# Patient Record
Sex: Female | Born: 1965 | Race: Black or African American | Hispanic: No | Marital: Married | Smoking: Never smoker
Health system: Southern US, Community
[De-identification: ages and names within clinical notes are randomized; demographics above are authoritative.]

## PROBLEM LIST (undated history)

## (undated) DIAGNOSIS — T7840XA Allergy, unspecified, initial encounter: Secondary | ICD-10-CM

## (undated) DIAGNOSIS — D649 Anemia, unspecified: Secondary | ICD-10-CM

## (undated) DIAGNOSIS — I10 Essential (primary) hypertension: Secondary | ICD-10-CM

## (undated) DIAGNOSIS — E785 Hyperlipidemia, unspecified: Secondary | ICD-10-CM

## (undated) DIAGNOSIS — E119 Type 2 diabetes mellitus without complications: Secondary | ICD-10-CM

## (undated) HISTORY — DX: Type 2 diabetes mellitus without complications: E11.9

## (undated) HISTORY — DX: Anemia, unspecified: D64.9

## (undated) HISTORY — DX: Essential (primary) hypertension: I10

## (undated) HISTORY — DX: Allergy, unspecified, initial encounter: T78.40XA

## (undated) HISTORY — DX: Hyperlipidemia, unspecified: E78.5

## (undated) HISTORY — PX: TUBAL LIGATION: SHX77

---

## 1999-10-10 ENCOUNTER — Other Ambulatory Visit: Admission: RE | Admit: 1999-10-10 | Discharge: 1999-10-10 | Payer: Self-pay | Admitting: Gynecology

## 2000-02-13 ENCOUNTER — Encounter: Payer: Self-pay | Admitting: Emergency Medicine

## 2000-02-13 ENCOUNTER — Emergency Department (HOSPITAL_COMMUNITY): Admission: EM | Admit: 2000-02-13 | Discharge: 2000-02-13 | Payer: Self-pay | Admitting: Emergency Medicine

## 2001-10-06 ENCOUNTER — Other Ambulatory Visit: Admission: RE | Admit: 2001-10-06 | Discharge: 2001-10-06 | Payer: Self-pay | Admitting: Gynecology

## 2003-11-26 ENCOUNTER — Other Ambulatory Visit: Admission: RE | Admit: 2003-11-26 | Discharge: 2003-11-26 | Payer: Self-pay | Admitting: Gynecology

## 2004-09-15 ENCOUNTER — Encounter (INDEPENDENT_AMBULATORY_CARE_PROVIDER_SITE_OTHER): Payer: Self-pay | Admitting: Internal Medicine

## 2004-09-15 LAB — CONVERTED CEMR LAB

## 2005-04-13 ENCOUNTER — Ambulatory Visit: Payer: Self-pay | Admitting: Family Medicine

## 2005-10-14 ENCOUNTER — Ambulatory Visit: Payer: Self-pay | Admitting: Family Medicine

## 2005-10-29 ENCOUNTER — Ambulatory Visit: Payer: Self-pay | Admitting: Family Medicine

## 2005-11-20 ENCOUNTER — Other Ambulatory Visit: Admission: RE | Admit: 2005-11-20 | Discharge: 2005-11-20 | Payer: Self-pay | Admitting: Gynecology

## 2006-01-23 ENCOUNTER — Ambulatory Visit: Payer: Self-pay | Admitting: Otolaryngology

## 2008-01-20 ENCOUNTER — Other Ambulatory Visit: Admission: RE | Admit: 2008-01-20 | Discharge: 2008-01-20 | Payer: Self-pay | Admitting: Gynecology

## 2008-01-20 ENCOUNTER — Encounter: Payer: Self-pay | Admitting: Gynecology

## 2008-01-20 ENCOUNTER — Ambulatory Visit: Payer: Self-pay | Admitting: Gynecology

## 2008-02-03 ENCOUNTER — Ambulatory Visit: Payer: Self-pay | Admitting: Gynecology

## 2008-06-28 ENCOUNTER — Ambulatory Visit: Payer: Self-pay | Admitting: Family Medicine

## 2008-06-28 DIAGNOSIS — J069 Acute upper respiratory infection, unspecified: Secondary | ICD-10-CM | POA: Insufficient documentation

## 2008-06-28 DIAGNOSIS — J309 Allergic rhinitis, unspecified: Secondary | ICD-10-CM | POA: Insufficient documentation

## 2008-07-26 ENCOUNTER — Encounter (INDEPENDENT_AMBULATORY_CARE_PROVIDER_SITE_OTHER): Payer: Self-pay | Admitting: Internal Medicine

## 2008-07-26 DIAGNOSIS — H9190 Unspecified hearing loss, unspecified ear: Secondary | ICD-10-CM | POA: Insufficient documentation

## 2009-01-30 ENCOUNTER — Other Ambulatory Visit: Admission: RE | Admit: 2009-01-30 | Discharge: 2009-01-30 | Payer: Self-pay | Admitting: Gynecology

## 2009-01-30 ENCOUNTER — Ambulatory Visit: Payer: Self-pay | Admitting: Gynecology

## 2009-01-30 ENCOUNTER — Encounter: Payer: Self-pay | Admitting: Gynecology

## 2009-02-18 ENCOUNTER — Ambulatory Visit: Payer: Self-pay | Admitting: Gynecology

## 2010-09-18 ENCOUNTER — Inpatient Hospital Stay (INDEPENDENT_AMBULATORY_CARE_PROVIDER_SITE_OTHER)
Admission: RE | Admit: 2010-09-18 | Discharge: 2010-09-18 | Disposition: A | Discharge status: Home / Self Care | Payer: 59 | Source: Ambulatory Visit | Attending: Family Medicine | Admitting: Family Medicine

## 2010-09-18 DIAGNOSIS — R6889 Other general symptoms and signs: Secondary | ICD-10-CM

## 2010-09-18 DIAGNOSIS — R509 Fever, unspecified: Secondary | ICD-10-CM

## 2010-11-04 ENCOUNTER — Inpatient Hospital Stay (INDEPENDENT_AMBULATORY_CARE_PROVIDER_SITE_OTHER)
Admission: RE | Admit: 2010-11-04 | Discharge: 2010-11-04 | Disposition: A | Discharge status: Home / Self Care | Payer: 59 | Source: Ambulatory Visit | Attending: Emergency Medicine | Admitting: Emergency Medicine

## 2010-11-04 DIAGNOSIS — M549 Dorsalgia, unspecified: Secondary | ICD-10-CM

## 2010-11-04 DIAGNOSIS — M79609 Pain in unspecified limb: Secondary | ICD-10-CM

## 2011-06-04 ENCOUNTER — Ambulatory Visit: Payer: 59 | Admitting: Family Medicine

## 2012-03-21 ENCOUNTER — Encounter: Payer: Self-pay | Admitting: Gynecology

## 2012-03-30 ENCOUNTER — Encounter: Payer: Self-pay | Admitting: Gynecology

## 2012-03-30 ENCOUNTER — Other Ambulatory Visit (HOSPITAL_COMMUNITY)
Admission: RE | Admit: 2012-03-30 | Discharge: 2012-03-30 | Disposition: A | Discharge status: Home / Self Care | Payer: 59 | Source: Ambulatory Visit | Attending: Gynecology | Admitting: Gynecology

## 2012-03-30 ENCOUNTER — Ambulatory Visit (INDEPENDENT_AMBULATORY_CARE_PROVIDER_SITE_OTHER): Payer: 59 | Admitting: Gynecology

## 2012-03-30 VITALS — BP 140/92 | Ht 63.0 in | Wt 255.0 lb

## 2012-03-30 DIAGNOSIS — N951 Menopausal and female climacteric states: Secondary | ICD-10-CM

## 2012-03-30 DIAGNOSIS — Z1151 Encounter for screening for human papillomavirus (HPV): Secondary | ICD-10-CM | POA: Insufficient documentation

## 2012-03-30 DIAGNOSIS — Z01419 Encounter for gynecological examination (general) (routine) without abnormal findings: Secondary | ICD-10-CM | POA: Insufficient documentation

## 2012-03-30 DIAGNOSIS — N949 Unspecified condition associated with female genital organs and menstrual cycle: Secondary | ICD-10-CM

## 2012-03-30 DIAGNOSIS — N925 Other specified irregular menstruation: Secondary | ICD-10-CM

## 2012-03-30 DIAGNOSIS — N938 Other specified abnormal uterine and vaginal bleeding: Secondary | ICD-10-CM

## 2012-03-30 LAB — CBC WITH DIFFERENTIAL/PLATELET
Basophils Absolute: 0 K/uL (ref 0.0–0.1)
Basophils Relative: 0 % (ref 0–1)
Eosinophils Absolute: 0.2 K/uL (ref 0.0–0.7)
Eosinophils Relative: 2 % (ref 0–5)
HCT: 39.3 % (ref 36.0–46.0)
Hemoglobin: 13.6 g/dL (ref 12.0–15.0)
Lymphocytes Relative: 40 % (ref 12–46)
Lymphs Abs: 2.8 K/uL (ref 0.7–4.0)
MCH: 29.9 pg (ref 26.0–34.0)
MCHC: 34.6 g/dL (ref 30.0–36.0)
MCV: 86.4 fL (ref 78.0–100.0)
Monocytes Absolute: 0.6 K/uL (ref 0.1–1.0)
Monocytes Relative: 8 % (ref 3–12)
Neutro Abs: 3.5 K/uL (ref 1.7–7.7)
Neutrophils Relative %: 50 % (ref 43–77)
Platelets: 293 K/uL (ref 150–400)
RBC: 4.55 MIL/uL (ref 3.87–5.11)
RDW: 13.7 % (ref 11.5–15.5)
WBC: 7 K/uL (ref 4.0–10.5)

## 2012-03-30 NOTE — Progress Notes (Signed)
Krista Newton [facility] July 13, 1965 [id number]        46 y.o.  [id number] for annual exam.  Has not been in the office in over 3 years. Several issues noted below.  Past medical history,surgical history, medications, allergies, family history and social history were all reviewed and documented in the EPIC chart. ROS:  Was performed and pertinent positives and negatives are included in the history.  Exam: Krista Newton Vitals:   03/30/12 1555  BP: 140/92  Height: 5\' 3"  (1.6 m)  Weight: 255 lb (115.667 kg)   General appearance  Normal Skin grossly normal Head/Neck normal with no cervical or supraclavicular adenopathy thyroid normal Lungs  clear Cardiac RR, without RMG Abdominal  soft, nontender, without masses, organomegaly or hernia Breasts  examined lying and sitting without masses, retractions, discharge or axillary adenopathy. Pelvic  Ext/BUS/vagina  normal   Cervix  normal Pap/HPV  Uterus  Anteverted to axial, normal size, shape and contour, midline and mobile nontender   Adnexa  Without masses or tenderness    Anus and perineum  normal   Rectovaginal  normal sphincter tone without palpated masses or tenderness.    Assessment/Plan:  46 y.o. [id number] female for annual exam, status post BTL.   1. Menopausal symptoms/DUB. Patient over the last 2 years has been having spotting on and off going several months and then spotting with the onset of hot flashes and night sweats. Has not had a regular period in several years. She was evaluated the last time we saw her actually for heavy periods and underwent a sonohysterogram which was normal and a benign proliferative endometrial biopsy. Also complaining of fatigue. Will check FSH/TSH and sonohysterogram. Options for treatment include observation, OTC soy based up to and including HRT reviewed. The risks benefits of HRT to include the WHI study, increased risk of stroke heart attack DVT and breast cancer reviewed. Patient's not interested at  this point and would prefer to try OTC soy. She'll follow up for her lab work and ultrasound and we'll go from there.  2. Pap smear. Pap/HPV done today. Last Pap smear 2010. No history of abnormal Pap smears. Multiple normal reports in her chart proceeding the 2010 Pap smear. If this Pap smear is normal plan every five-year screening. 3. Mammography. Patient's overdo and noticed to schedule I gave her names of facilities in [place] to call and schedule. SBE mildly reviewed. 4. Fatigue/weight gain. The need to exercise and diet reviewed. 5. Hypertension. The patient's blood pressure is 140/92. She does check it at home historically and said that usually is not this high but usually runs between 120-130/70-80.  I asked her to have this rechecked in a nonexam situation. She'll also follow up with her primary at the scheduled appointment to have this rechecked. 6. Health maintenance. Patient has an appointment to see her primary physician and no additional lab work was done. The need to have her cholesterol glucose and any other lab her primary feels needed done discussed.Dara Lords MD, 4:24 PM 03/30/2012

## 2012-03-30 NOTE — Patient Instructions (Addendum)
Follow up for ultrasound as scheduled.  Call to Schedule your mammogram  Facilities in [place]: 1)  [facility], [address] Phone: [phone] 2)  [facility], [address] Phone: [phone] 3)  Dr. Bertrand at [facility]  [address] Phone: [phone]     Mammogram A mammogram is an X-ray test to find changes in a woman's breast. You should get a mammogram if:  You are 48 years of age or older  You have risk factors.   Your doctor recommends that you have one.  BEFORE THE TEST  Do not schedule the test the week before your period, especially if your breasts are sore during this time.  On the day of your mammogram:  Wash your breasts and armpits well. After washing, do not put on any deodorant or talcum powder on until after your test.   Eat and drink as you usually do.   Take your medicines as usual.   If you are diabetic and take insulin, make sure you:   Eat before coming for your test.   Take your insulin as usual.   If you cannot keep your appointment, call before the appointment to cancel. Schedule another appointment.  TEST  You will need to undress from the waist up. You will put on a hospital gown.   Your breast will be put on the mammogram machine, and it will press firmly on your breast with a piece of plastic called a compression paddle. This will make your breast flatter so that the machine can X-ray all parts of your breast.   Both breasts will be X-rayed. Each breast will be X-rayed from above and from the side. An X-ray might need to be taken again if the picture is not good enough.   The mammogram will last about 15 to 30 minutes.  AFTER THE TEST Finding out the results of your test Ask when your test results will be ready. Make sure you get your test results.  Document Released: 07/31/2008 Document Revised: 04/23/2011 Document  Reviewed: 07/31/2008 ExitCare Patient Information 2012 [employer].   

## 2012-03-31 ENCOUNTER — Encounter: Payer: Self-pay | Admitting: Gynecology

## 2012-03-31 LAB — URINALYSIS W MICROSCOPIC + REFLEX CULTURE
Bacteria, UA: NONE SEEN
Bilirubin Urine: NEGATIVE
Casts: NONE SEEN
Crystals: NONE SEEN
Glucose, UA: NEGATIVE mg/dL
Hgb urine dipstick: NEGATIVE
Ketones, ur: NEGATIVE mg/dL
Leukocytes, UA: NEGATIVE
Nitrite: NEGATIVE
Protein, ur: NEGATIVE mg/dL
Specific Gravity, Urine: 1.011 (ref 1.005–1.030)
Squamous Epithelial / HPF: NONE SEEN
Urobilinogen, UA: 0.2 mg/dL (ref 0.0–1.0)
pH: 7 (ref 5.0–8.0)

## 2012-03-31 LAB — FOLLICLE STIMULATING HORMONE: FSH: 40 m[IU]/mL

## 2012-03-31 LAB — TSH: TSH: 2.214 u[IU]/mL (ref 0.350–4.500)

## 2012-05-13 ENCOUNTER — Other Ambulatory Visit: Payer: Self-pay | Admitting: Gynecology

## 2012-05-13 ENCOUNTER — Ambulatory Visit (INDEPENDENT_AMBULATORY_CARE_PROVIDER_SITE_OTHER): Payer: 59

## 2012-05-13 ENCOUNTER — Ambulatory Visit (INDEPENDENT_AMBULATORY_CARE_PROVIDER_SITE_OTHER): Payer: 59 | Admitting: Gynecology

## 2012-05-13 ENCOUNTER — Encounter: Payer: Self-pay | Admitting: Gynecology

## 2012-05-13 DIAGNOSIS — N924 Excessive bleeding in the premenopausal period: Secondary | ICD-10-CM

## 2012-05-13 DIAGNOSIS — N951 Menopausal and female climacteric states: Secondary | ICD-10-CM

## 2012-05-13 DIAGNOSIS — N938 Other specified abnormal uterine and vaginal bleeding: Secondary | ICD-10-CM

## 2012-05-13 DIAGNOSIS — N882 Stricture and stenosis of cervix uteri: Secondary | ICD-10-CM

## 2012-05-13 DIAGNOSIS — N925 Other specified irregular menstruation: Secondary | ICD-10-CM

## 2012-05-13 DIAGNOSIS — N949 Unspecified condition associated with female genital organs and menstrual cycle: Secondary | ICD-10-CM

## 2012-05-13 MED ORDER — LIDOCAINE HCL (PF) 1 % IJ SOLN
12.0000 mL | Freq: Once | INTRAMUSCULAR | Status: AC
  Administered 2012-05-13: 12 mL

## 2012-05-13 NOTE — Progress Notes (Signed)
Patient presents for sonohysterogram.  History of perimenopausal symptoms coming and going over the last several years with hot flashes/night sweats. Also spotting on and off that comes and goes over the last year or two. No regular menses.  Ultrasound shows normal uterine size and echotexture. Endometrial echo 3.8 mm. Right and left ovaries normal. Cul-de-sac negative. Cervix was stenotic. Paracervical block 1% lidocaine 10 cc total placed.  Single-tooth tenaculum anterior lip stabilization. Cervical os was dilated and subsequently the sonohysterogram catheter was placed without difficulty, good distention with no abnormalities. Endometrial sample taken. Patient tolerated well.  Assessment and plan: Patient's lab work shows [facility] of [facility]. TSH normal. Perimenopausal with oscillating hot flashes and sweats as well as some spotting on and off. Patient will follow up for biopsy results. Assuming negative. Options for management were reviewed to include observation, soy based OTC products, prescription nonhormonal such as Effexor or and HRT. I discussed the risks benefits of HRT and various routes to include oral transdermal transvaginal. WHI study with increased risk of stroke heart attack DVT and breast cancer reviewed. Patient would prefer observation with OTC soy-based product trial. Assuming biopsy negative she will try this follow up if she wants to rediscuss HRT or alternatives.

## 2012-05-13 NOTE — Patient Instructions (Addendum)
Office will call you with biopsy results Try over the counter Soy based products as we discussed Call if you want to rediscuss hormone treatment or if you have prolonged/atypical bleeding

## 2012-06-14 ENCOUNTER — Telehealth: Payer: Self-pay | Admitting: Family Medicine

## 2012-06-14 NOTE — Telephone Encounter (Signed)
appt scheduled

## 2012-06-14 NOTE — Telephone Encounter (Signed)
That is fine - go ahead and schedule, thanks

## 2012-06-14 NOTE — Telephone Encounter (Signed)
Patient called wanting to make an appt with you for a phy.  She was informed that her last date of service in this office was 06/28/08, so she would be considered a new patient.  She said when her husband Krista Newton - 05/14/67 ) came in on his last visit he discussed his wife coming back as a new patient with you and you agreed.  Please advise as to your wishes on this.

## 2012-07-04 ENCOUNTER — Ambulatory Visit (INDEPENDENT_AMBULATORY_CARE_PROVIDER_SITE_OTHER): Payer: 59 | Admitting: Family Medicine

## 2012-07-04 ENCOUNTER — Encounter: Payer: Self-pay | Admitting: Family Medicine

## 2012-07-04 VITALS — BP 136/94 | HR 90 | Temp 98.3°F | Ht 64.0 in | Wt 252.2 lb

## 2012-07-04 DIAGNOSIS — Z0000 Encounter for general adult medical examination without abnormal findings: Secondary | ICD-10-CM | POA: Insufficient documentation

## 2012-07-04 LAB — CBC WITH DIFFERENTIAL/PLATELET
Basophils Absolute: 0 10*3/uL (ref 0.0–0.1)
Basophils Relative: 0.6 % (ref 0.0–3.0)
Hemoglobin: 13.4 g/dL (ref 12.0–15.0)
Lymphs Abs: 2.2 10*3/uL (ref 0.7–4.0)
MCHC: 33.9 g/dL (ref 30.0–36.0)
MCV: 89.1 fl (ref 78.0–100.0)
Neutro Abs: 3.6 10*3/uL (ref 1.4–7.7)
RBC: 4.42 Mil/uL (ref 3.87–5.11)

## 2012-07-04 LAB — COMPREHENSIVE METABOLIC PANEL
ALT: 18 U/L (ref 0–35)
Albumin: 4 g/dL (ref 3.5–5.2)
CO2: 27 mEq/L (ref 19–32)
Chloride: 105 mEq/L (ref 96–112)
GFR: 122.71 mL/min (ref >60.00)
Glucose, Bld: 106 mg/dL — ABNORMAL HIGH (ref 70–99)
Potassium: 4.1 mEq/L (ref 3.5–5.1)
Sodium: 139 mEq/L (ref 135–145)
Total Bilirubin: 0.8 mg/dL (ref 0.3–1.2)
Total Protein: 7.8 g/dL (ref 6.0–8.3)

## 2012-07-04 LAB — LIPID PANEL
HDL: 54.3 mg/dL (ref >39.00)
Total CHOL/HDL Ratio: 4

## 2012-07-04 LAB — TSH: TSH: 1.68 u[IU]/mL (ref 0.35–5.50)

## 2012-07-04 NOTE — Progress Notes (Signed)
Subjective:    Patient ID: Krista Newton, female    DOB: 1965/07/15, 47 y.o.   MRN: [id number]  HPI Here for health maintenance exam and to review chronic medical problems    Doing ok overall but stays tired   Wt is down 3 lb  Obese  Not exercising   Flu vaccine - got that in the fall at her job   Pap 11/13 Sees Dr Audie Box Had visit to discuss menopausal symptoms -LMP was 4-5 years ago  Had hystosalpingogram for bleeding issues  Had thyroid test then  Fsh/ LH were up  Disc options for hot flashes  She opted not to treat at this time  She needs annual mammogram - is about to schedule that at the breast center  No lumps or changes on self exam  Needs labs today  Patient Active Problem List  Diagnosis  . HEARING LOSS, RIGHT EAR  . ALLERGIC RHINITIS  . Routine general medical examination at a health care facility   No past medical history on file. Past Surgical History  Procedure Laterality Date  . Cesarean section    . Tubal ligation     History  Substance Use Topics  . Smoking status: Never Smoker   . Smokeless tobacco: Never Used  . Alcohol Use: No   Family History  Problem Relation Age of Onset  . Hypertension Mother   . Diabetes Mother   . Hypertension Father    No Known Allergies Current Outpatient Prescriptions on File Prior to Visit  Medication Sig Dispense Refill  . Acetaminophen (TYLENOL PO) Take by mouth.       No current facility-administered medications on file prior to visit.        Review of Systems Review of Systems  Constitutional: Negative for fever, appetite change,  and unexpected weight change. pos for fatigue Eyes: Negative for pain and visual disturbance.  Respiratory: Negative for cough and shortness of breath.   Cardiovascular: Negative for cp or palpitations    Gastrointestinal: Negative for nausea, diarrhea and constipation.  Genitourinary: Negative for urgency and frequency. pos for menopausal hot flashes and  difficulty sleeping  Skin: Negative for pallor or rash   Neurological: Negative for weakness, light-headedness, numbness and headaches.  Hematological: Negative for adenopathy. Does not bruise/bleed easily.  Psychiatric/Behavioral: Negative for dysphoric mood. The patient is not nervous/anxious.         Objective:   Physical Exam  Constitutional: She appears well-developed and well-nourished. No distress.  obese and well appearing   HENT:  Head: Normocephalic and atraumatic.  Right Ear: External ear normal.  Left Ear: External ear normal.  Nose: Nose normal.  Mouth/Throat: Oropharynx is clear and moist.  Eyes: Conjunctivae and EOM are normal. Pupils are equal, round, and reactive to light. Right eye exhibits no discharge. Left eye exhibits no discharge. No scleral icterus.  Neck: Normal range of motion. Neck supple. No JVD present. Carotid bruit is not present. No thyromegaly present.  Cardiovascular: Normal rate, regular rhythm, normal heart sounds and intact distal pulses.  Exam reveals no gallop.   Pulmonary/Chest: Breath sounds normal. No respiratory distress. She has no wheezes.  Abdominal: Soft. Bowel sounds are normal. She exhibits no distension, no abdominal bruit and no mass. There is no tenderness.  Musculoskeletal: She exhibits no edema and no tenderness.  Lymphadenopathy:    She has no cervical adenopathy.  Neurological: She is alert. She has normal reflexes. No cranial nerve deficit. She exhibits normal muscle tone. Coordination  normal.  Skin: Skin is warm and dry. No rash noted. No erythema. No pallor.  Psychiatric: She has a normal mood and affect.          Assessment & Plan:

## 2012-07-04 NOTE — Patient Instructions (Addendum)
Work on [employer] with regular meals - healthy choices  Aim for 5 days of exercise a week (30 min for each session)  Think about weight watchers or another plan  Labs today

## 2012-07-04 NOTE — Assessment & Plan Note (Signed)
Reviewed health habits including diet and exercise and skin cancer prevention Also reviewed health mt list, fam hx and immunizations  Wellness labs drawn today Pt is experiencing fatigue and hot flashes from menopause- following that with gyn Highly recommended wt loss and a regular exercise program

## 2012-07-05 ENCOUNTER — Encounter: Payer: Self-pay | Admitting: *Deleted

## 2012-08-15 ENCOUNTER — Ambulatory Visit (INDEPENDENT_AMBULATORY_CARE_PROVIDER_SITE_OTHER): Payer: 59 | Admitting: Family Medicine

## 2012-08-15 ENCOUNTER — Encounter: Payer: Self-pay | Admitting: Family Medicine

## 2012-08-15 VITALS — BP 138/86 | HR 78 | Temp 98.9°F | Ht 64.0 in | Wt 254.2 lb

## 2012-08-15 DIAGNOSIS — J019 Acute sinusitis, unspecified: Secondary | ICD-10-CM

## 2012-08-15 DIAGNOSIS — B9689 Other specified bacterial agents as the cause of diseases classified elsewhere: Secondary | ICD-10-CM

## 2012-08-15 MED ORDER — GUAIFENESIN-CODEINE 100-10 MG/5ML PO SYRP: 5.0000 mL | ORAL_SOLUTION | Freq: Four times a day (QID) | ORAL | Status: DC | PRN

## 2012-08-15 MED ORDER — AMOXICILLIN-POT CLAVULANATE 875-125 MG PO TABS: 1.0000 | ORAL_TABLET | Freq: Two times a day (BID) | ORAL | Status: DC

## 2012-08-15 NOTE — Progress Notes (Signed)
Subjective:    Patient ID: Krista Newton, female    DOB: 1965-07-01, 47 y.o.   MRN: [id number]  HPI Here with a bad cough and also a rash  This started last week  She has been working in a very dusty area  Sinus congestion with facial pain on the R  Took some tylenol cough and cold - not getting rid of it  By Friday- cold chills- thinks she had a fever Now coughing all night  Sometimes gets a little phlegm up (not a lot- yellow in color)  Also used some nasal saline spray  Fullness in her ears -worse on R   Hx of pneumonia a year ago  Last week had a rash - skin felt rough and chaffed - and she had some scaley brown spots on her face and arms  A moisturizer helped  Much better now   She does wear a mask at work  Patient Active Problem List  Diagnosis  . HEARING LOSS, RIGHT EAR  . ALLERGIC RHINITIS  . Routine general medical examination at a health care facility   No past medical history on file. Past Surgical History  Procedure Laterality Date  . Cesarean section    . Tubal ligation     History  Substance Use Topics  . Smoking status: Never Smoker   . Smokeless tobacco: Never Used  . Alcohol Use: No   Family History  Problem Relation Age of Onset  . Hypertension Mother   . Diabetes Mother   . Hypertension Father    No Known Allergies Current Outpatient Prescriptions on File Prior to Visit  Medication Sig Dispense Refill  . Acetaminophen (TYLENOL PO) Take by mouth.       No current facility-administered medications on file prior to visit.    Review of Systems Review of Systems  Constitutional: Negative for appetite change,  and unexpected weight change.  Eyes: Negative for pain and visual disturbance.  ENT pos for congestion/ sinus pain/ ear pain and st  Respiratory: Negative for wheeze  and shortness of breath.   Cardiovascular: Negative for cp or palpitations    Gastrointestinal: Negative for nausea, diarrhea and constipation.  Genitourinary:  Negative for urgency and frequency.  Skin: Negative for pallor or rash   Neurological: Negative for weakness, light-headedness, numbness and headaches.  Hematological: Negative for adenopathy. Does not bruise/bleed easily.  Psychiatric/Behavioral: Negative for dysphoric mood. The patient is not nervous/anxious.         Objective:   Physical Exam  Constitutional: She appears well-developed and well-nourished. No distress.  obese and well appearing   HENT:  Head: Normocephalic and atraumatic.  Right Ear: External ear normal.  Left Ear: External ear normal.  Mouth/Throat: Oropharynx is clear and moist. No oropharyngeal exudate.  Nares are injected and congested  bilat maxillary sinus tenderness worse on R   Eyes: Conjunctivae and EOM are normal. Pupils are equal, round, and reactive to light. Right eye exhibits no discharge. Left eye exhibits no discharge.  Neck: Normal range of motion. Neck supple.  Cardiovascular: Normal rate and regular rhythm.   Pulmonary/Chest: Effort normal. No respiratory distress. She has no wheezes. She has no rales. She exhibits no tenderness.  Lymphadenopathy:    She has no cervical adenopathy.  Skin: Skin is warm and dry. No rash noted.  Rash resolved Some dry skin on arms  Psychiatric: She has a normal mood and affect.          Assessment & Plan:

## 2012-08-15 NOTE — Patient Instructions (Addendum)
Drink lots of fluids and try to get some rest  Use nasal saline spray Take augmentin for sinus infection  Use cough medicine with caution (sedation) Update if not starting to improve in a week or if worsening

## 2012-08-15 NOTE — Assessment & Plan Note (Signed)
With ongoing cough and R sided facial pain / purulent drainage tx with augmentin and for cough- robitussin ac with caution Disc symptomatic care - see instructions on AVS  Update if not starting to improve in a week or if worsening

## 2012-08-17 ENCOUNTER — Telehealth: Payer: Self-pay | Admitting: Family Medicine

## 2012-08-17 NOTE — Telephone Encounter (Signed)
Letter mailed per pt request.

## 2012-08-17 NOTE — Telephone Encounter (Signed)
Patient Information:  Caller Name: Elzie  Phone: [phone]  Patient: Krista Newton, Krista Newton  Gender: Female  DOB: Jun 13, 1965  Age: 47 Years  PCP: Roxy Manns [facility])  Pregnant: No  Office Follow Up:  Does the office need to follow up with this patient?: Yes  Instructions For The Office: Patient seen on 08/15/12 by Dr. Milinda Antis.  She is requesting a note from work to cover 03/31-through 08/17/12.  Please follow up patient regarding this.  RN Note:  Nose bleed occurred this morning during bath-07:15-it bled about 10 mins today.  Has happened before with sinus trouble.  Patient has had no other episodes with this sinus infection.  Denies history of HTN or being on blood thinners.  Triaged with no emergent symptom assessed.  Care advice given with caller demonstrating her understanding.  Patient was seen in the office on 08/15/12 and given Augmentin and Robitussin AC.  Is requesting a note for work to cover being out 03/31 through 4/02.  Symptoms  Reason For Call & Symptoms: Nosebleed.  Was seen by office on 04/01-diagnosed with sinus infection.  Placed on Augmentin and a cough medicine.  Reviewed Health History In EMR: Yes  Reviewed Medications In EMR: Yes  Reviewed Allergies In EMR: Yes  Reviewed Surgeries / Procedures: Yes  Date of Onset of Symptoms: 08/17/2012 OB / GYN:  LMP: Unknown  Guideline(s) Used:  Nosebleed  Disposition Per Guideline:   Home Care  Reason For Disposition Reached:   Bleeding present < 30 minutes and using correct method of direct pressure  Advice Given:  N/A  Patient Will Follow Care Advice:  YES

## 2012-08-17 NOTE — Telephone Encounter (Signed)
Work note in IN box

## 2013-02-07 ENCOUNTER — Encounter (HOSPITAL_COMMUNITY): Payer: Self-pay | Admitting: Emergency Medicine

## 2013-02-07 ENCOUNTER — Emergency Department (HOSPITAL_COMMUNITY)
Admission: EM | Admit: 2013-02-07 | Discharge: 2013-02-07 | Disposition: A | Discharge status: Home / Self Care | Payer: 59 | Attending: Emergency Medicine | Admitting: Emergency Medicine

## 2013-02-07 ENCOUNTER — Emergency Department (HOSPITAL_COMMUNITY): Payer: 59

## 2013-02-07 DIAGNOSIS — M62830 Muscle spasm of back: Secondary | ICD-10-CM

## 2013-02-07 DIAGNOSIS — J069 Acute upper respiratory infection, unspecified: Secondary | ICD-10-CM

## 2013-02-07 DIAGNOSIS — R509 Fever, unspecified: Secondary | ICD-10-CM | POA: Insufficient documentation

## 2013-02-07 DIAGNOSIS — R300 Dysuria: Secondary | ICD-10-CM | POA: Insufficient documentation

## 2013-02-07 DIAGNOSIS — IMO0001 Reserved for inherently not codable concepts without codable children: Secondary | ICD-10-CM | POA: Insufficient documentation

## 2013-02-07 DIAGNOSIS — Z792 Long term (current) use of antibiotics: Secondary | ICD-10-CM | POA: Insufficient documentation

## 2013-02-07 DIAGNOSIS — M5380 Other specified dorsopathies, site unspecified: Secondary | ICD-10-CM | POA: Insufficient documentation

## 2013-02-07 LAB — URINALYSIS, ROUTINE W REFLEX MICROSCOPIC
Glucose, UA: NEGATIVE mg/dL
Ketones, ur: NEGATIVE mg/dL
Leukocytes, UA: NEGATIVE
Nitrite: NEGATIVE
pH: 7 (ref 5.0–8.0)

## 2013-02-07 MED ORDER — MORPHINE SULFATE 4 MG/ML IJ SOLN
4.0000 mg | Freq: Once | INTRAMUSCULAR | Status: AC
  Administered 2013-02-07: 4 mg via INTRAMUSCULAR
  Filled 2013-02-07: qty 1

## 2013-02-07 MED ORDER — CYCLOBENZAPRINE HCL 10 MG PO TABS: 10.0000 mg | ORAL_TABLET | Freq: Two times a day (BID) | ORAL | Status: DC | PRN

## 2013-02-07 MED ORDER — PREDNISONE 20 MG PO TABS: 40.0000 mg | ORAL_TABLET | Freq: Every day | ORAL | Status: DC

## 2013-02-07 MED ORDER — DEXAMETHASONE SODIUM PHOSPHATE 10 MG/ML IJ SOLN
10.0000 mg | Freq: Once | INTRAMUSCULAR | Status: AC
  Administered 2013-02-07: 10 mg via INTRAMUSCULAR
  Filled 2013-02-07: qty 1

## 2013-02-07 NOTE — ED Provider Notes (Signed)
CSN: [id number]     Arrival date & time 02/07/13  1728 History   First MD Initiated Contact with Patient 02/07/13 2107     Chief Complaint  Patient presents with  . Back Pain  . Cough  . Dysuria   (Consider location/radiation/quality/duration/timing/severity/associated sxs/prior Treatment) HPI Comments: Patient is a 47 year old female presented to the emergency department for multiple complaints. Patient's first complaint is right sided lower back pain without radiation that has been present for 2-3 days. Patient describes the pain as muscle tightness that is worsened with movement and ambulation. Patient tried one Motrin with minimal relief last evening. Patient states she is able to walk but it is painful. She states resting alleviates her pain. She rates her back pain 9/10. She denies any bladder or bowel incontinence, history of back surgery, history of IV drug use, history of cancer. Patient's second complaint is one day of nonproductive cough with subjective fever and chills and bodyaches. Patient states typically once a year she gets a pneumonia or bronchitis. She has not tried any over-the-counter medications or symptomatic care to help alleviate cough but has tried a Tylenol with some relief of myalgias. Denies headache, numbness or weakness, hemoptysis, chest pain, shortness of breath.  Patient is a 47 y.o. female presenting with back pain, cough, and dysuria.  Back Pain Associated symptoms: no abdominal pain, no chest pain, no dysuria, no fever and no headaches   Cough Associated symptoms: no chest pain, no fever, no headaches, no shortness of breath and no wheezing   Dysuria Associated symptoms: no abdominal pain, no fever, no flank pain, no nausea and no vomiting     History reviewed. No pertinent past medical history. Past Surgical History  Procedure Laterality Date  . Cesarean section    . Tubal ligation     Family History  Problem Relation Age of Onset  . Hypertension  Mother   . Diabetes Mother   . Hypertension Father    History  Substance Use Topics  . Smoking status: Never Smoker   . Smokeless tobacco: Never Used  . Alcohol Use: No   OB History   Grav Para Term Preterm Abortions TAB SAB Ect Mult Living   2 2 2       2      Review of Systems  Constitutional: Negative for fever.  HENT: Negative for neck pain and neck stiffness.   Eyes: Negative.   Respiratory: Positive for cough. Negative for shortness of breath and wheezing.   Cardiovascular: Negative for chest pain.  Gastrointestinal: Negative for nausea, vomiting and abdominal pain.  Genitourinary: Positive for urgency. Negative for dysuria, frequency and flank pain.  Musculoskeletal: Positive for back pain.  Skin: Negative.   Neurological: Negative for syncope and headaches.    Allergies  Review of patient's allergies indicates no known allergies.  Home Medications   Current Outpatient Rx  Name  Route  Sig  Dispense  Refill  . Acetaminophen (TYLENOL PO)   Oral   Take by mouth.         Marland Kitchen ibuprofen (ADVIL,MOTRIN) 200 MG tablet   Oral   Take 400 mg by mouth every 6 (six) hours as needed for pain.         . pseudoephedrine (SUDAFED) 30 MG tablet   Oral   Take 30 mg by mouth every 4 (four) hours as needed for congestion.         Marland Kitchen amoxicillin-clavulanate (AUGMENTIN) 875-125 MG per tablet   Oral  Take 1 tablet by mouth 2 (two) times daily.   14 tablet   0   . cyclobenzaprine (FLEXERIL) 10 MG tablet   Oral   Take 1 tablet (10 mg total) by mouth 2 (two) times daily as needed for muscle spasms.   20 tablet   0   . predniSONE (DELTASONE) 20 MG tablet   Oral   Take 2 tablets (40 mg total) by mouth daily.   10 tablet   0    BP 112/81  Pulse 89  Temp(Src) 98.8 F (37.1 C) (Rectal)  Resp 16  SpO2 100% Physical Exam  Constitutional: She is oriented to person, place, and time. She appears well-developed and well-nourished. No distress.  HENT:  Head:  Normocephalic and atraumatic.  Right Ear: External ear normal.  Left Ear: External ear normal.  Nose: Nose normal.  Mouth/Throat: Uvula is midline, oropharynx is clear and moist and mucous membranes are normal. No oropharyngeal exudate.  Eyes: Conjunctivae and EOM are normal. Pupils are equal, round, and reactive to light.  Neck: Normal range of motion. Neck supple. No spinous process tenderness and no muscular tenderness present.  Cardiovascular: Normal rate, regular rhythm, normal heart sounds and intact distal pulses.   Pulmonary/Chest: Effort normal and breath sounds normal. No respiratory distress. She has no wheezes. She has no rales.  Abdominal: Soft. There is no tenderness.  Musculoskeletal:       Cervical back: Normal.       Lumbar back: She exhibits tenderness and spasm. She exhibits normal range of motion, no bony tenderness, no swelling and no deformity.       Back:  Neurological: She is alert and oriented to person, place, and time. She has normal strength. No cranial nerve deficit or sensory deficit. Gait normal.  No pronator drift. Bilateral heel-knee-intact  Skin: Skin is warm and dry. She is not diaphoretic.    ED Course  Procedures (including critical care time) Labs Review Labs Reviewed  URINALYSIS, ROUTINE W REFLEX MICROSCOPIC   Imaging Review Dg Chest 2 View  02/07/2013   CLINICAL DATA:  Left-sided posterior chest pain with movement or deep inspiration which began earlier today, along with cough, body aches, and low-grade fever.  EXAM: CHEST  2 VIEW  COMPARISON:  None.  FINDINGS: Suboptimal inspiration due to body habitus which accounts for crowded bronchovascular markings diffusely and accentuates the heart size. Taking this into account, cardiomediastinal silhouette unremarkable. Lungs clear. Bronchovascular markings normal. Pulmonary vascularity normal. No visible pleural effusions. No pneumothorax. Visualized bony thorax intact.  IMPRESSION: Suboptimal  inspiration. No acute cardiopulmonary disease.   Electronically Signed   By: Hulan Saas   On: 02/07/2013 18:21    MDM   1. URI (upper respiratory infection)   2. Muscle spasm of back     Afebrile, NAD, non-toxic appearing, AAOx4.   Back pain: Patient with back pain likely muscle spasm.  No neurological deficits and normal neuro exam.  Patient can walk but states is painful.  No loss of bowel or bladder control.  No concern for cauda equina.  No fever, night sweats, weight loss, h/o cancer, IVDU.  RICE protocol and pain medicine indicated and discussed with patient.   Cough: Pt CXR negative for acute infiltrate. Patients symptoms are consistent with URI, likely viral etiology. Discussed that antibiotics are not indicated for viral infections. Pt will be discharged with symptomatic treatment.  Verbalizes understanding and is agreeable with plan. Pt is hemodynamically stable & in NAD prior to dc.  Return precautions discussed. Patient is agreeable to plan. Patient is stable at time of discharge  Patient d/w with Dr. Rubin Payor, agrees with plan.     Jeannetta Ellis, PA-C 02/08/13 0142

## 2013-02-07 NOTE — ED Notes (Signed)
Pt sts urinary frequency last week; c/o right lower back pain today with body aches, chills and cough

## 2013-02-07 NOTE — Discharge Instructions (Signed)
Please call your primary care doctor to schedule a follow up appointment. Please take Flexeril as prescribed, please do not drive on this medication. Please start your Prednisone on Thursday and take as prescribed. Please use Motrin to help with back pain as well. Please read all discharge instructions and return precautions.   Upper Respiratory Infection, Adult An upper respiratory infection (URI) is also known as the common cold. It is often caused by a type of germ (virus). Colds are easily spread (contagious). You can pass it to others by kissing, coughing, sneezing, or drinking out of the same glass. Usually, you get better in 1 or 2 weeks.  HOME CARE   Only take medicine as told by your doctor.  Use a warm mist humidifier or breathe in steam from a hot shower.  Drink enough water and fluids to keep your pee (urine) clear or pale yellow.  Get plenty of rest.  Return to work when your temperature is back to normal or as told by your doctor. You may use a face mask and wash your hands to stop your cold from spreading. GET HELP RIGHT AWAY IF:   After the first few days, you feel you are getting worse.  You have questions about your medicine.  You have chills, shortness of breath, or brown or red spit (mucus).  You have yellow or brown snot (nasal discharge) or pain in the face, especially when you bend forward.  You have a fever, puffy (swollen) neck, pain when you swallow, or white spots in the back of your throat.  You have a bad headache, ear pain, sinus pain, or chest pain.  You have a high-pitched whistling sound when you breathe in and out (wheezing).  You have a lasting cough or cough up blood.  You have sore muscles or a stiff neck. MAKE SURE YOU:   Understand these instructions.  Will watch your condition.  Will get help right away if you are not doing well or get worse. Document Released: 10/21/2007 Document Revised: 07/27/2011 Document Reviewed:  09/08/2010 [facility] Patient Information 2014 [place], [place]. Back Pain, Adult Low back pain is very common. About 1 in 5 people have back pain.The cause of low back pain is rarely dangerous. The pain often gets better over time.About half of people with a sudden onset of back pain feel better in just 2 weeks. About 8 in 10 people feel better by 6 weeks.  CAUSES Some common causes of back pain include:  Strain of the muscles or ligaments supporting the spine.  Wear and tear (degeneration) of the spinal discs.  Arthritis.  Direct injury to the back. DIAGNOSIS Most of the time, the direct cause of low back pain is not known.However, back pain can be treated effectively even when the exact cause of the pain is unknown.Answering your caregiver's questions about your overall health and symptoms is one of the most accurate ways to make sure the cause of your pain is not dangerous. If your caregiver needs more information, he or she may order lab work or imaging tests (X-rays or MRIs).However, even if imaging tests show changes in your back, this usually does not require surgery. HOME CARE INSTRUCTIONS For many people, back pain returns.Since low back pain is rarely dangerous, it is often a condition that people can learn to [facility] their own.   Remain active. It is stressful on the back to sit or stand in one place. Do not sit, drive, or stand in one place for  more than 30 minutes at a time. Take short walks on level surfaces as soon as pain allows.Try to increase the length of time you walk each day.  Do not stay in bed.Resting more than 1 or 2 days can delay your recovery.  Do not avoid exercise or work.Your body is made to move.It is not dangerous to be active, even though your back may hurt.Your back will likely heal faster if you return to being active before your pain is gone.  Pay attention to your body when you bend and lift. Many people have less discomfortwhen lifting if  they bend their knees, keep the load close to their bodies,and avoid twisting. Often, the most comfortable positions are those that put less stress on your recovering back.  Find a comfortable position to sleep. Use a firm mattress and lie on your side with your knees slightly bent. If you lie on your back, put a pillow under your knees.  Only take over-the-counter or prescription medicines as directed by your caregiver. Over-the-counter medicines to reduce pain and inflammation are often the most helpful.Your caregiver may prescribe muscle relaxant drugs.These medicines help dull your pain so you can more quickly return to your normal activities and healthy exercise.  Put ice on the injured area.  Put ice in a plastic bag.  Place a towel between your skin and the bag.  Leave the ice on for 15-20 minutes, 3-4 times a day for the first 2 to 3 days. After that, ice and heat may be alternated to reduce pain and spasms.  Ask your caregiver about trying back exercises and gentle massage. This may be of some benefit.  Avoid feeling anxious or stressed.Stress increases muscle tension and can worsen back pain.It is important to recognize when you are anxious or stressed and learn ways to manage it.Exercise is a great option. SEEK MEDICAL CARE IF:  You have pain that is not relieved with rest or medicine.  You have pain that does not improve in 1 week.  You have new symptoms.  You are generally not feeling well. SEEK IMMEDIATE MEDICAL CARE IF:   You have pain that radiates from your back into your legs.  You develop new bowel or bladder control problems.  You have unusual weakness or numbness in your arms or legs.  You develop nausea or vomiting.  You develop abdominal pain.  You feel faint. Document Released: 05/04/2005 Document Revised: 11/03/2011 Document Reviewed: 09/22/2010 [facility] Patient Information 2014 [place], [place].

## 2013-02-09 NOTE — ED Provider Notes (Signed)
Medical screening examination/treatment/procedure(s) were performed by non-physician practitioner and as supervising physician I was immediately available for consultation/collaboration.  Young Brim R. Camilla Skeen, MD 02/09/13 1613 

## 2013-05-31 ENCOUNTER — Ambulatory Visit (INDEPENDENT_AMBULATORY_CARE_PROVIDER_SITE_OTHER): Payer: 59 | Admitting: Family Medicine

## 2013-05-31 ENCOUNTER — Encounter: Payer: Self-pay | Admitting: Family Medicine

## 2013-05-31 VITALS — BP 136/94 | HR 89 | Temp 98.7°F | Ht 64.0 in | Wt 251.5 lb

## 2013-05-31 DIAGNOSIS — G5600 Carpal tunnel syndrome, unspecified upper limb: Secondary | ICD-10-CM

## 2013-05-31 DIAGNOSIS — J069 Acute upper respiratory infection, unspecified: Secondary | ICD-10-CM | POA: Insufficient documentation

## 2013-05-31 MED ORDER — MELOXICAM 15 MG PO TABS
15.0000 mg | ORAL_TABLET | Freq: Every day | ORAL | Status: DC
Start: 2013-05-31 — End: 2013-09-11

## 2013-05-31 NOTE — Progress Notes (Signed)
Subjective:    Patient ID: Krista Newton, female    DOB: 1965-07-19, 48 y.o.   MRN: [id number]  HPI Here for tingling and numbness in fingers of both hands ( however much worse in the R hand)  L hand - primarily wrist pain /occ numbness at night  It wakes her up at night -not getting much sleep  This started before the holidays   Also -hand went numb when trying to peel potatoes Some tender spots in shoulder - ? 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 Carpal tunnel syndrome 05/31/2013  . Viral URI 05/31/2013  . Routine general medical examination at a health care facility 07/04/2012  . HEARING LOSS, RIGHT EAR 07/26/2008  . ALLERGIC RHINITIS 06/28/2008   No past medical history on file. Past Surgical History  Procedure Laterality Date  . Cesarean section    . Tubal ligation     History  Substance Use Topics  . Smoking status: Never Smoker   . Smokeless tobacco: Never Used  . Alcohol Use: No   Family History  Problem Relation Age of Onset  . Hypertension Mother   . Diabetes Mother   . Hypertension Father    No Known Allergies Current Outpatient Prescriptions on File Prior to Visit  Medication Sig Dispense Refill  . Acetaminophen (TYLENOL PO) Take by mouth.       No current facility-administered medications on file prior to visit.    Review of Systems Review of Systems  Constitutional: Negative for fever, appetite change,  and unexpected weight change.  ENT pos for  congestion / rhinorrhea and drip / neg for facial pain  Eyes: Negative for pain and visual disturbance.  Respiratory: Negative for wheeze  and shortness of breath.   Cardiovascular: Negative for cp or palpitations    Gastrointestinal: Negative for nausea, diarrhea and constipation.  Genitourinary: Negative for urgency and frequency.  Skin: Negative for pallor or rash   Neurological: Negative for weakness, light-headedness, and headaches. pos for hand numb/tingling/ pain  Hematological: Negative for adenopathy. Does not bruise/bleed easily.  Psychiatric/Behavioral: Negative for dysphoric mood. The patient is not nervous/anxious.         Objective:   Physical Exam  Constitutional: She appears well-developed and well-nourished. No distress.  obese and well appearing   HENT:  Head: Normocephalic and atraumatic.  Right Ear: External ear normal.  Left Ear: External ear normal.  Mouth/Throat: Oropharynx is clear and moist. No oropharyngeal exudate.  Nares are injected and congested  Clear rhinorrhea No sinus tenderness  Eyes: Conjunctivae and EOM are normal. Pupils are equal, round, and reactive to light. Right eye exhibits no discharge. Left eye exhibits no discharge.  Neck: Normal range of motion. Neck supple.  Cardiovascular: Normal rate and regular rhythm.   Pulmonary/Chest: Effort normal and breath sounds normal. No respiratory distress. She has no wheezes. She has no rales.  Lymphadenopathy:  She has no cervical adenopathy.  Neurological: She is alert. She has normal strength and normal reflexes. She displays no atrophy and no tremor. A sensory deficit is present. She exhibits normal muscle tone.  bilat wrists- pos tinel and phalen's tests Dec sens to lt touch on R 1-4th fingers only Nl proprioception/ temp sens and dtrs   Skin: Skin is warm and dry. No rash noted.  Psychiatric: She has a normal mood and affect.          Assessment & Plan:

## 2013-05-31 NOTE — Patient Instructions (Signed)
For carpal tunnel- wear wrist brace as much as you can (most of all at night)  Try meloxicam daily with food  If no improvement in 2 weeks - call and let me know If cold symptoms do not improve in the next week also let me know  Carpal Tunnel Syndrome The carpal tunnel is a narrow area located on the palm side of your wrist. The tunnel is formed by the wrist bones and ligaments. Nerves, blood vessels, and tendons pass through the carpal tunnel. Repeated wrist motion or certain diseases may cause swelling within the tunnel. This swelling pinches the main nerve in the wrist (median nerve) and causes the painful hand and arm condition called carpal tunnel syndrome. CAUSES   Repeated wrist motions.  Wrist injuries.  Certain diseases like arthritis, diabetes, alcoholism, hyperthyroidism, and kidney failure.  Obesity.  Pregnancy. SYMPTOMS   A "pins and needles" feeling in your fingers or hand.  Tingling or numbness in your fingers or hand.  An aching feeling in your entire arm.  Wrist pain that goes up your arm to your shoulder.  Pain that goes down into your palm or fingers.  A weak feeling in your hands. DIAGNOSIS  Your caregiver will take your history and perform a physical exam. An electromyography test may be needed. This test measures electrical signals sent out by the muscles. The electrical signals are usually slowed by carpal tunnel syndrome. You may also need X-rays. TREATMENT  Carpal tunnel syndrome may clear up by itself. Your caregiver may recommend a wrist splint or medicine such as a nonsteroidal anti-inflammatory medicine. Cortisone injections may help. Sometimes, surgery may be needed to free the pinched nerve.  HOME CARE INSTRUCTIONS   Take all medicine as directed by your caregiver. Only take over-the-counter or prescription medicines for pain, discomfort, or fever as directed by your caregiver.  If you were given a splint to keep your wrist from bending, wear it  as directed. It is important to wear the splint at night. Wear the splint for as long as you have pain or numbness in your hand, arm, or wrist. This may take 1 to 2 months.  Rest your wrist from any activity that may be causing your pain. If your symptoms are work-related, you may need to talk to your employer about changing to a job that does not require using your wrist.  Put ice on your wrist after long periods of wrist activity.  Put ice in a plastic bag.  Place a towel between your skin and the bag.  Leave the ice on for 15-20 minutes, 03-04 times a day.  Keep all follow-up visits as directed by your caregiver. This includes any orthopedic referrals, physical therapy, and rehabilitation. Any delay in getting necessary care could result in a delay or failure of your condition to heal. SEEK IMMEDIATE MEDICAL CARE IF:   You have new, unexplained symptoms.  Your symptoms get worse and are not helped or controlled with medicines. MAKE SURE YOU:   Understand these instructions.  Will watch your condition.  Will get help right away if you are not doing well or get worse. Document Released: 05/01/2000 Document Revised: 07/27/2011 Document Reviewed: 03/20/2011 [facility] Patient Information 2014 [place], [place].

## 2013-05-31 NOTE — Progress Notes (Signed)
Pre-visit discussion using our clinic review tool. No additional management support is needed unless otherwise documented below in the visit note.  

## 2013-06-01 NOTE — Assessment & Plan Note (Signed)
Handout given  Wrist brace given and fit - will wear as much as possible  meloxicam  Cold compress prn Will update if not imp in 1-2 wk

## 2013-06-01 NOTE — Assessment & Plan Note (Signed)
Reassuring exam Disc symptomatic care - see instructions on AVS Update if not starting to improve in a week or if worsening   

## 2013-06-02 ENCOUNTER — Telehealth: Payer: Self-pay | Admitting: Family Medicine

## 2013-06-02 DIAGNOSIS — G5600 Carpal tunnel syndrome, unspecified upper limb: Secondary | ICD-10-CM

## 2013-06-02 MED ORDER — AZITHROMYCIN 250 MG PO TABS: ORAL_TABLET | ORAL | Status: DC

## 2013-06-02 NOTE — Telephone Encounter (Signed)
Please call in zithromax for uri I am referring her to a hand specialist - let her know we will call her about that - and if the wrist splint worsens her symptoms then take it off

## 2013-06-02 NOTE — Telephone Encounter (Signed)
Rx sent to pharmacy and left voicemail letting pt know [name] sent in Rx and that she referred her to hand specialist and not to wear the wrist splint if it's making her sxs worse

## 2013-06-02 NOTE — Telephone Encounter (Signed)
Pt states when she was here this week that [name] told her to call if she was no better.  Pt has a fever now and is no better.  Also, pain at the top of her shoulder has intensified.  When she wears the splint it feels like it intensifies the tingling in her hand more.  She needs something for her cough. [phone]

## 2013-09-11 ENCOUNTER — Ambulatory Visit (INDEPENDENT_AMBULATORY_CARE_PROVIDER_SITE_OTHER): Payer: 59 | Admitting: Family Medicine

## 2013-09-11 ENCOUNTER — Encounter: Payer: Self-pay | Admitting: Family Medicine

## 2013-09-11 VITALS — BP 142/88 | HR 88 | Temp 98.2°F | Wt 255.8 lb

## 2013-09-11 DIAGNOSIS — IMO0002 Reserved for concepts with insufficient information to code with codable children: Secondary | ICD-10-CM

## 2013-09-11 DIAGNOSIS — R82998 Other abnormal findings in urine: Secondary | ICD-10-CM

## 2013-09-11 DIAGNOSIS — R252 Cramp and spasm: Secondary | ICD-10-CM

## 2013-09-11 DIAGNOSIS — M545 Low back pain, unspecified: Secondary | ICD-10-CM

## 2013-09-11 DIAGNOSIS — M5416 Radiculopathy, lumbar region: Secondary | ICD-10-CM

## 2013-09-11 LAB — POCT URINALYSIS DIPSTICK
BILIRUBIN UA: NEGATIVE
Blood, UA: NEGATIVE
Glucose, UA: NEGATIVE
Ketones, UA: NEGATIVE
Leukocytes, UA: NEGATIVE
Nitrite, UA: NEGATIVE
PROTEIN UA: NEGATIVE
Spec Grav, UA: 1.01
Urobilinogen, UA: NEGATIVE
pH, UA: 6

## 2013-09-11 MED ORDER — PREDNISONE 20 MG PO TABS: ORAL_TABLET | ORAL | Status: DC

## 2013-09-11 NOTE — Progress Notes (Signed)
Pre visit review using our clinic review tool, if applicable. No additional management support is needed unless otherwise documented below in the visit note. 

## 2013-09-11 NOTE — Progress Notes (Signed)
[place] [address] Phone: [phone] Fax: [phone]  Patient ID: Krista Newton MRN: [id number], DOB: July 06, 1965, 48 y.o. Date of Encounter: 09/11/2013  Primary Physician:  Loura Pardon, MD   Chief Complaint: Numbness   Subjective:   History of Present Illness:  Krista Newton is a 48 y.o. very pleasant female patient who presents with the following: Back Pain  ongoing for approximately: at least 7-8 months off and on. The patient has had back pain before. The back pain is localized into the lumbar spine area.  Numbness on the R posterior aspect of her buttocks on the right. This is not constant, and the patient is not having any symptoms currently.  Has had some leg cramps off and on.  September - went to the emergency room.   Seen bill gramig for CTS. She actually has a followup appointment with him today. She is doing what sounds like some upper extremity and spinal rehabilitation at physical therapy at Dr. Vanetta Shawl office.   Cramping and up the side of the leg.  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   Cramps also  No bowel or bladder incontinence. No focal weakness. Prior interventions: nsaids, tylenol Physical therapy: yes, neck, ? Low back Chiropractic manipulations: No Acupuncture: No Osteopathic manipulation: No Heat or cold: Minimal effect  Past Medical History, Surgical History, Family History, Medications, Allergies have been reviewed and updated if relevant.  Review of Systems  GEN: No fevers, chills. Nontoxic. Primarily MSK c/o today. MSK: Detailed in the HPI GI: tolerating PO intake without difficulty Neuro: As above  Otherwise the pertinent positives of the ROS are noted above.     Objective:   Physical Exam Filed Vitals:   09/11/13 1151  BP: 142/88  Pulse: 88  Temp: 98.2 F (36.8 C)  TempSrc: Oral  Weight: 255 lb 12 oz (116.007 kg)  SpO2: 97%    Gen: Well-developed,well-nourished,in no acute distress;  alert,appropriate and cooperative throughout examination HEENT: Normocephalic and atraumatic without obvious abnormalities.  Ears, externally no deformities Pulm: Breathing comfortably in no respiratory distress Range of motion at  the waist: Flexion, rotation and lateral bending: essentially full  No echymosis or edema Rises to examination table with no difficulty Gait: minimally antalgic  Inspection/Deformity: No abnormality Paraspinus T:  Minimally tender  B Ankle Dorsiflexion (L5,4): 5/5 B Great Toe Dorsiflexion (L5,4): 5/5 Heel Walk (L5): WNL Toe Walk (S1): WNL Rise/Squat (L4): WNL, mild pain  SENSORY B Medial Foot (L4): WNL B Dorsum (L5): WNL B Lateral (S1): WNL Light Touch: WNL Pinprick: WNL  REFLEXES Knee (L4): 2+ Ankle (S1): 2+  B SLR, seated: neg B SLR, supine: neg B FABER: neg B Reverse FABER: neg B Greater Troch: NT B Log Roll: neg B Stork: NT B Sciatic Notch: NT  HIP EXAM: SIDE: b ROM: Abduction, Flexion, Internal and External range of motion: full Pain with terminal IROM and EROM: none GTB: NT SLR: NEG Knees: No effusion FABER: NT REVERSE FABER: NT, neg Piriformis: NT at direct palpation Str: flexion: 5/5 abduction: 5/5 adduction: 5/5 Strength testing non-tender  Results for orders placed in visit on 09/11/13  POCT URINALYSIS DIPSTICK      Result Value Ref Range   Color, UA YELLOW     Clarity, UA CLEAR     Glucose, UA NEGATIVE     Bilirubin, UA NEGATIVE     Ketones, UA NEGATIVE     Spec Grav, UA 1.010     Blood, UA NEGATIVE  pH, UA 6.0     Protein, UA NEGATIVE     Urobilinogen, UA negative     Nitrite, UA NEGATIVE     Leukocytes, UA Negative       Radiology: No results found.  Assessment & Plan:   Low back pain  Dark urine - Plan: Urinalysis Dipstick  Lumbar radiculopathy  Muscle cramping   I reviewed with the patient the anatomical structures involved.  Conservative algorithms for acute back pain generally begin  with the following: NSAIDS, Muscle Relaxants, Mild pain medication  Start with medications, core rehab, and progress from there following low back pain algorithm. No red flags are present.  I am not sure what to tell the patient about her cramping. I suggested that she try to stay hydrated, add table salt to food, or eat foods that had a greater amount of sodium. Also suggested the patient lose weight and get more exercise.  New Prescriptions   PREDNISONE (DELTASONE) 20 MG TABLET    2 tabs po daily 4 days, then 1 tab for 4 days    Follow-up: No Follow-up on file. Unless noted above, the patient is to follow-up if symptoms worsen. Red flags were reviewed with the patient. Otherwise, they should follow-up for routine medical care.    Signed,  Maud Deed. Takari Lundahl, MD, [place]  Patient's Medications  New Prescriptions   PREDNISONE (DELTASONE) 20 MG TABLET    2 tabs po daily 4 days, then 1 tab for 4 days  Previous Medications   NAPROXEN SODIUM (ANAPROX) 220 MG TABLET    Take 440 mg by mouth daily.   PYRIDOXINE (B-6) 100 MG TABLET    Take 100 mg by mouth daily.  Modified Medications   No medications on file  Discontinued Medications   ACETAMINOPHEN (TYLENOL PO)    Take by mouth.   AZITHROMYCIN (ZITHROMAX Z-PAK) 250 MG TABLET    Take 2 pills by mouth today and then 1 pill daily for 4 days   MELOXICAM (MOBIC) 15 MG TABLET    Take 1 tablet (15 mg total) by mouth daily. With food

## 2013-09-18 ENCOUNTER — Encounter: Payer: Self-pay | Admitting: Family Medicine

## 2013-09-18 ENCOUNTER — Ambulatory Visit (INDEPENDENT_AMBULATORY_CARE_PROVIDER_SITE_OTHER): Payer: 59 | Admitting: Family Medicine

## 2013-09-18 ENCOUNTER — Telehealth: Payer: Self-pay | Admitting: Family Medicine

## 2013-09-18 VITALS — BP 146/96 | HR 87 | Temp 98.8°F | Ht 64.0 in | Wt 255.5 lb

## 2013-09-18 DIAGNOSIS — Z8619 Personal history of other infectious and parasitic diseases: Secondary | ICD-10-CM | POA: Insufficient documentation

## 2013-09-18 DIAGNOSIS — B029 Zoster without complications: Secondary | ICD-10-CM

## 2013-09-18 MED ORDER — VALACYCLOVIR HCL 1 G PO TABS: 1000.0000 mg | ORAL_TABLET | Freq: Three times a day (TID) | ORAL | Status: DC

## 2013-09-18 MED ORDER — TRAMADOL HCL 50 MG PO TABS: 50.0000 mg | ORAL_TABLET | Freq: Three times a day (TID) | ORAL | Status: DC | PRN

## 2013-09-18 NOTE — Progress Notes (Signed)
   Subjective:    Patient ID: Krista Newton, female    DOB: 1965-06-19, 48 y.o.   MRN: [id number]  HPI Was here last Monday - and she saw Dr Lorelei Pont - for numbness over R side (flank area) Gave her spine exercises and prednisone  She started taking that - first dose on Monday   Then felt a bump in that area -now she has a rash over that area and over anterior R thigh  It is aggrivated/ sensitive - but not severely painful   Also has a mild headache today  Just feeling tired in general  Has not taken otc medicine  She had one day left of prednisone today and held it   Patient Active Problem List   Diagnosis Date Noted  . Carpal tunnel syndrome 05/31/2013  . Viral URI 05/31/2013  . Routine general medical examination at a health care facility 07/04/2012  . HEARING LOSS, RIGHT EAR 07/26/2008  . ALLERGIC RHINITIS 06/28/2008   No past medical history on file. Past Surgical History  Procedure Laterality Date  . Cesarean section    . Tubal ligation     History  Substance Use Topics  . Smoking status: Never Smoker   . Smokeless tobacco: Never Used  . Alcohol Use: No   Family History  Problem Relation Age of Onset  . Hypertension Mother   . Diabetes Mother   . Hypertension Father    No Known Allergies Current Outpatient Prescriptions on File Prior to Visit  Medication Sig Dispense Refill  . naproxen sodium (ANAPROX) 220 MG tablet Take 440 mg by mouth daily.      Marland Kitchen pyridoxine (B-6) 100 MG tablet Take 100 mg by mouth daily.      . predniSONE (DELTASONE) 20 MG tablet 2 tabs po daily 4 days, then 1 tab for 4 days  12 tablet  0   No current facility-administered medications on file prior to visit.     Review of Systems Review of Systems  Constitutional: Negative for fever, appetite change, and unexpected weight change.  Eyes: Negative for pain and visual disturbance.  Respiratory: Negative for cough and shortness of breath.   Cardiovascular: Negative for cp or  palpitations    Gastrointestinal: Negative for nausea, diarrhea and constipation.  Genitourinary: Negative for urgency and frequency.  Skin: Negative for pallor and pos for rash   Neurological: Negative for weakness, light-headedness, numbness and pos for headache.  Hematological: Negative for adenopathy. Does not bruise/bleed easily.  Psychiatric/Behavioral: Negative for dysphoric mood. The patient is not nervous/anxious.         Objective:   Physical Exam  Constitutional: She appears well-developed and well-nourished. No distress.  obese and well appearing   HENT:  Head: Normocephalic and atraumatic.  Eyes: Conjunctivae and EOM are normal. Pupils are equal, round, and reactive to light.  Neck: Normal range of motion. Neck supple.  Cardiovascular: Normal rate and regular rhythm.   Pulmonary/Chest: Effort normal and breath sounds normal.  Musculoskeletal: She exhibits tenderness.  Tender medial to R scapula (per pt chronic)  Tender over R lumbar area and around rash  Lymphadenopathy:    She has no cervical adenopathy.  Neurological: She is alert. She has normal reflexes.  Skin: Skin is warm and dry.  Rash (patches of vesicles with erythema)- in L1 distribution- back and front of thigh   No pustules            Assessment & Plan:

## 2013-09-18 NOTE — Progress Notes (Signed)
Pre visit review using our clinic review tool, if applicable. No additional management support is needed unless otherwise documented below in the visit note. 

## 2013-09-18 NOTE — Patient Instructions (Signed)
You have shingles  Keep the rash clean  Take tramadol for pain as needed Take the course of valtrex as directed  You can stop the prednisone    Shingles Shingles (herpes zoster) is an infection that is caused by the same virus that causes chickenpox (varicella). The infection causes a painful skin rash and fluid-filled blisters, which eventually break open, crust over, and heal. It may occur in any area of the body, but it usually affects only one side of the body or face. The pain of shingles usually lasts about 1 month. However, some people with shingles may develop long-term (chronic) pain in the affected area of the body. Shingles often occurs many years after the person had chickenpox. It is more common:  In people older than 50 years.  In people with weakened immune systems, such as those with HIV, AIDS, or cancer.  In people taking medicines that weaken the immune system, such as transplant medicines.  In people under great stress. CAUSES  Shingles is caused by the varicella zoster virus (VZV), which also causes chickenpox. After a person is infected with the virus, it can remain in the person's body for years in an inactive state (dormant). To cause shingles, the virus reactivates and breaks out as an infection in a nerve root. The virus can be spread from person to person (contagious) through contact with open blisters of the shingles rash. It will only spread to people who have not had chickenpox. When these people are exposed to the virus, they may develop chickenpox. They will not develop shingles. Once the blisters scab over, the person is no longer contagious and cannot spread the virus to others. SYMPTOMS  Shingles shows up in stages. The initial symptoms may be pain, itching, and tingling in an area of the skin. This pain is usually described as burning, stabbing, or throbbing.In a few days or weeks, a painful red rash will appear in the area where the pain, itching, and  tingling were felt. The rash is usually on one side of the body in a band or belt-like pattern. Then, the rash usually turns into fluid-filled blisters. They will scab over and dry up in approximately 2 3 weeks. Flu-like symptoms may also occur with the initial symptoms, the rash, or the blisters. These may include:  Fever.  Chills.  Headache.  Upset stomach. DIAGNOSIS  Your caregiver will perform a skin exam to diagnose shingles. Skin scrapings or fluid samples may also be taken from the blisters. This sample will be examined under a microscope or sent to a lab for further testing. TREATMENT  There is no specific cure for shingles. Your caregiver will likely prescribe medicines to help you manage the pain, recover faster, and avoid long-term problems. This may include antiviral drugs, anti-inflammatory drugs, and pain medicines. HOME CARE INSTRUCTIONS   Take a cool bath or apply cool compresses to the area of the rash or blisters as directed. This may help with the pain and itching.   Only take over-the-counter or prescription medicines as directed by your caregiver.   Rest as directed by your caregiver.  Keep your rash and blisters clean with mild soap and cool water or as directed by your caregiver.  Do not pick your blisters or scratch your rash. Apply an anti-itch cream or numbing creams to the affected area as directed by your caregiver.  Keep your shingles rash covered with a loose bandage (dressing).  Avoid skin contact with:  Babies.   Pregnant  women.   Children with eczema.   Elderly people with transplants.   People with chronic illnesses, such as leukemia or AIDS.   Wear loose-fitting clothing to help ease the pain of material rubbing against the rash.  Keep all follow-up appointments with your caregiver.If the area involved is on your face, you may receive a referral for follow-up to a specialist, such as an eye doctor (ophthalmologist) or an ear, nose,  and throat (ENT) doctor. Keeping all follow-up appointments will help you avoid eye complications, chronic pain, or disability.  SEEK IMMEDIATE MEDICAL CARE IF:   You have facial pain, pain around the eye area, or loss of feeling on one side of your face.  You have ear pain or ringing in your ear.  You have loss of taste.  Your pain is not relieved with prescribed medicines.   Your redness or swelling spreads.   You have more pain and swelling.  Your condition is worsening or has changed.   You have a feveror persistent symptoms for more than 2 3 days.  You have a fever and your symptoms suddenly get worse. MAKE SURE YOU:  Understand these instructions.  Will watch your condition.  Will get help right away if you are not doing well or get worse. Document Released: 05/04/2005 Document Revised: 01/27/2012 Document Reviewed: 12/17/2011 [facility] Patient Information 2014 [place].

## 2013-09-18 NOTE — Telephone Encounter (Signed)
I will see her then  

## 2013-09-18 NOTE — Assessment & Plan Note (Signed)
With numbness/ funny feeling /discomfort that pre dated her rash  Disc care of rash  Start valtrex 1g tid for 7 d Tramadol for pain prn and headache  Update if not starting to improve in a week or if worsening  - headache or pain/ rash  Given handout on zoster  Recommend imm when she qualifies for it

## 2013-09-18 NOTE — Telephone Encounter (Signed)
Patient Information:  Caller Name: Renuka  Phone: [phone]  Patient: Krista Newton, Krista Newton  Gender: Female  DOB: 1965-07-06  Age: 48 Years  PCP: Loura Pardon [facility])  Pregnant: No  Office Follow Up:  Does the office need to follow up with this patient?: Yes  Instructions For The Office: Please review and contact patient if any further instructions before appointment.  RN Note:  Has appointment with [name] at 15:00.  Symptoms  Reason For Call & Symptoms: Patient calling; seen 09/11/13.  On Prednisone.  On 4/30 or 09/15/13 had pimple like area / bite on back.  Currently reports rash on back onset 09/18/13; also has rash on posterior thigh.  She has headache (8 of 10)not relieved by Tylenol.   Spots are raised patches of varied size; not itchy, but irritated feeling.  Headache is reported as worst symptom. Emergent symptoms ruled out.  Callback by PCP or subspecialist within 1 hour per Headache guideline due to Severe headache and has had severe headaches before.  Reviewed Health History In EMR: Yes  Reviewed Medications In EMR: Yes  Reviewed Allergies In EMR: Yes  Reviewed Surgeries / Procedures: Yes  Date of Onset of Symptoms: 09/18/2013  Treatments Tried: Tylenol for headache  Treatments Tried Worked: No OB / GYN:  LMP: Unknown  Guideline(s) Used:  Headache  Disposition Per Guideline:   Callback by PCP or Subspecialist within 1 Hour  Reason For Disposition Reached:   Severe headache and has had severe headaches before  Advice Given:  Reassurance - Migraine Headache:  This sounds like a painful headache that you are having, but there are pain medications you can take and other instructions I can give you to reduce the pain.  Rest:   Lie down in a dark, quiet place and try to relax. Close your eyes and imagine your entire body relaxing.  Apply Cold to the Area:   Apply a cold wet washcloth or cold pack to the forehead for 20 minutes.  Stretching:   Stretch and  massage any tight neck muscles.  Call Back If:  You become worse.  RN Overrode Recommendation:  Patient Already Has Appt, Document Patient  Has appointment with [name] @ 15:00

## 2013-09-21 ENCOUNTER — Telehealth: Payer: Self-pay | Admitting: Family Medicine

## 2013-09-21 NOTE — Telephone Encounter (Signed)
Left voicemail requesting pt to call office back, but pt does have appt scheduled with you on 09/22/13

## 2013-09-21 NOTE — Telephone Encounter (Signed)
Patient Information:  Caller Name: Leimomi  Phone: [phone]  Patient: Krista Newton, Krista Newton  Gender: Female  DOB: 12-22-1965  Age: 48 Years  PCP: Loura Pardon [facility])  Pregnant: No  Office Follow Up:  Does the office need to follow up with this patient?: No  Instructions For The Office: N/A  RN Note:  Pt is tearful on phone and states that she is in pain and unable to go to work.  Pt appt planned for eval of pain and treatment of sxs  Symptoms  Reason For Call & Symptoms: Pt was seen on 5/4 dx with shingles.  Pt reports that she has a headache and she feels like she is cramping.  Shingles are present on right side of waist and top of thigh.  The HA was present on 4/2.  Pt states that she does not feel any better and has not been able to work.  Cramping is present at the same site as the shingles.  Afebrile  Reviewed Health History In EMR: Yes  Reviewed Medications In EMR: Yes  Reviewed Allergies In EMR: Yes  Reviewed Surgeries / Procedures: Yes  Date of Onset of Symptoms: 09/18/2013 OB / GYN:  LMP: Unknown  Guideline(s) Used:  Rash or Redness - Localized  Disposition Per Guideline:   See Today in Office  Reason For Disposition Reached:   Painful rash and has multiple small blisters grouped together in one area of body (i.e., dermatomal distribution or "band" or "stripe")  Advice Given:  N/A  Patient Will Follow Care Advice:  YES  Appointment Scheduled:  09/22/2013 09:15:00 Appointment Scheduled Provider:  Loura Pardon (Family Practice)  Pt offered and refused appt on 5/7 stating that she wants to see the same MD

## 2013-09-21 NOTE — Telephone Encounter (Signed)
Please ask if tramadol is helping ? How often is she taking it ?  Any new symptoms?  Thanks

## 2013-09-21 NOTE — Telephone Encounter (Signed)
Noted! Routed to PCP 

## 2013-09-21 NOTE — Telephone Encounter (Signed)
Is the headache better /worse or the same?

## 2013-09-22 ENCOUNTER — Ambulatory Visit (INDEPENDENT_AMBULATORY_CARE_PROVIDER_SITE_OTHER): Payer: 59 | Admitting: Family Medicine

## 2013-09-22 ENCOUNTER — Encounter: Payer: Self-pay | Admitting: Family Medicine

## 2013-09-22 VITALS — BP 112/74 | HR 99 | Temp 99.1°F | Ht 64.0 in | Wt 251.5 lb

## 2013-09-22 DIAGNOSIS — B029 Zoster without complications: Secondary | ICD-10-CM

## 2013-09-22 DIAGNOSIS — B370 Candidal stomatitis: Secondary | ICD-10-CM

## 2013-09-22 MED ORDER — GABAPENTIN 100 MG PO CAPS
100.0000 mg | ORAL_CAPSULE | Freq: Two times a day (BID) | ORAL | Status: DC
Start: 2013-09-22 — End: 2014-02-14

## 2013-09-22 MED ORDER — NYSTATIN 100000 UNIT/ML MT SUSP
5.0000 mL | Freq: Three times a day (TID) | OROMUCOSAL | Status: DC
Start: 2013-09-22 — End: 2014-02-14

## 2013-09-22 NOTE — Patient Instructions (Signed)
Over the weekend -take tramadol as needed and get some rest , stay out of work today  If pain worsens or tramadol is not helping- start the low dose gabapentin (this will initially make you sleepy)-and update me

## 2013-09-22 NOTE — Progress Notes (Signed)
Pre visit review using our clinic review tool, if applicable. No additional management support is needed unless otherwise documented below in the visit note. 

## 2013-09-22 NOTE — Progress Notes (Signed)
Subjective:    Patient ID: Krista Newton, female    DOB: [birth year], 48 y.o.   MRN: [id number]  HPI Here with shingles  More pain yesterday with back "cramping" -- on and off pain is worse - sometimes it moves up to higher back and neck  Constant pain and it "grabs her" at times   Her rash is drying up  She has used a little vinegar water   She tried to work yesterday  Up and down ladders and headache got worse and just felt awesome   Headache is better today  Low grade temp 99.1 now - but she does not feel like she has a fever   Her toungue is white and she feels like she has a dry mouth  Does not feel funny  She is making effort to drink water   Tramadol is helping take the edge off her pain so she can sleep   Patient Active Problem List   Diagnosis Date Noted  . Shingles 09/18/2013  . Carpal tunnel syndrome 05/31/2013  . Routine general medical examination at a health care facility 07/04/2012  . HEARING LOSS, RIGHT EAR 07/26/2008  . ALLERGIC RHINITIS 06/28/2008   No past medical history on file. Past Surgical History  Procedure Laterality Date  . Cesarean section    . Tubal ligation     History  Substance Use Topics  . Smoking status: Never Smoker   . Smokeless tobacco: Never Used  . Alcohol Use: No   Family History  Problem Relation Age of Onset  . Hypertension Mother   . Diabetes Mother   . Hypertension Father    No Known Allergies Current Outpatient Prescriptions on File Prior to Visit  Medication Sig Dispense Refill  . traMADol (ULTRAM) 50 MG tablet Take 1 tablet (50 mg total) by mouth every 8 (eight) hours as needed for moderate pain.  45 tablet  0  . valACYclovir (VALTREX) 1000 MG tablet Take 1 tablet (1,000 mg total) by mouth 3 (three) times daily.  21 tablet  0  . naproxen sodium (ANAPROX) 220 MG tablet Take 440 mg by mouth daily.      Marland Kitchen pyridoxine (B-6) 100 MG tablet Take 100 mg by mouth daily.       No current facility-administered  medications on file prior to visit.    Review of Systems Review of Systems  Constitutional: Negative for fever, appetite change, fatigue and unexpected weight change.  Eyes: Negative for pain and visual disturbance.  ENT pos for dry mouth that is bothersome  Respiratory: Negative for cough and shortness of breath.   Cardiovascular: Negative for cp or palpitations    Gastrointestinal: Negative for nausea, diarrhea and constipation.  Genitourinary: Negative for urgency and frequency.  Skin: Negative for pallor and pos for rash that is painful  Neurological: Negative for weakness, light-headedness, numbness and headaches.  Hematological: Negative for adenopathy. Does not bruise/bleed easily.  Psychiatric/Behavioral: Negative for dysphoric mood. The patient is not nervous/anxious.         Objective:   Physical Exam  Constitutional: She appears well-developed and well-nourished. No distress.  HENT:  Head: Normocephalic and atraumatic.  White non scrapable coating on tongue   Eyes: Conjunctivae and EOM are normal. Pupils are equal, round, and reactive to light.  Neck: Normal range of motion. Neck supple.  Cardiovascular: Normal rate and regular rhythm.   Pulmonary/Chest: Effort normal and breath sounds normal.  Musculoskeletal: She exhibits tenderness. She exhibits no edema.  Tender over R back/ zoster rash and several dermatomes above and below   Lymphadenopathy:    She has no cervical adenopathy.  Neurological: She is alert. She has normal reflexes.  Skin: Skin is warm and dry. Rash noted.  Resolving shingles rash with drying patches of vesicles on R buttock/ant thigh that is tender   Psychiatric: She has a normal mood and affect.          Assessment & Plan:

## 2013-09-24 NOTE — Assessment & Plan Note (Signed)
Gradual improvement today after a painful day yesterday Off work fri/sat/sun Enc use of tramadol if it helps  Update if not starting to improve in a week or if worsening   Gabapentin 100 mg px given to try if no further imp- disc how we can gradually titrate dose

## 2013-09-24 NOTE — Assessment & Plan Note (Signed)
tx with nystatin solution and update

## 2014-01-15 ENCOUNTER — Telehealth: Payer: Self-pay | Admitting: *Deleted

## 2014-01-15 NOTE — Telephone Encounter (Signed)
Pt called requesting name of facility to have mammogram left on voicemail [facility] phone [facility]

## 2014-02-06 ENCOUNTER — Encounter: Payer: Self-pay | Admitting: Family Medicine

## 2014-02-06 ENCOUNTER — Ambulatory Visit (INDEPENDENT_AMBULATORY_CARE_PROVIDER_SITE_OTHER): Payer: 59 | Admitting: Family Medicine

## 2014-02-06 VITALS — BP 140/96 | HR 87 | Temp 98.4°F | Ht 64.0 in | Wt 251.8 lb

## 2014-02-06 DIAGNOSIS — B3731 Acute candidiasis of vulva and vagina: Secondary | ICD-10-CM | POA: Insufficient documentation

## 2014-02-06 DIAGNOSIS — K117 Disturbances of salivary secretion: Secondary | ICD-10-CM

## 2014-02-06 DIAGNOSIS — R252 Cramp and spasm: Secondary | ICD-10-CM

## 2014-02-06 DIAGNOSIS — R638 Other symptoms and signs concerning food and fluid intake: Secondary | ICD-10-CM

## 2014-02-06 DIAGNOSIS — X58XXXA Exposure to other specified factors, initial encounter: Secondary | ICD-10-CM

## 2014-02-06 DIAGNOSIS — B373 Candidiasis of vulva and vagina: Secondary | ICD-10-CM

## 2014-02-06 DIAGNOSIS — R682 Dry mouth, unspecified: Secondary | ICD-10-CM | POA: Insufficient documentation

## 2014-02-06 DIAGNOSIS — IMO0001 Reserved for inherently not codable concepts without codable children: Secondary | ICD-10-CM | POA: Insufficient documentation

## 2014-02-06 DIAGNOSIS — T731XXA Deprivation of water, initial encounter: Secondary | ICD-10-CM

## 2014-02-06 LAB — COMPREHENSIVE METABOLIC PANEL
ALT: 30 U/L (ref 0–35)
AST: 30 U/L (ref 0–37)
Albumin: 4 g/dL (ref 3.5–5.2)
Alkaline Phosphatase: 83 U/L (ref 39–117)
BUN: 12 mg/dL (ref 6–23)
CHLORIDE: 102 meq/L (ref 96–112)
CO2: 26 meq/L (ref 19–32)
Calcium: 9.4 mg/dL (ref 8.4–10.5)
Creatinine, Ser: 0.6 mg/dL (ref 0.4–1.2)
GFR: 126.31 mL/min (ref >60.00)
Glucose, Bld: 210 mg/dL — ABNORMAL HIGH (ref 70–99)
Potassium: 4 mEq/L (ref 3.5–5.1)
SODIUM: 135 meq/L (ref 135–145)
TOTAL PROTEIN: 7.9 g/dL (ref 6.0–8.3)
Total Bilirubin: 1 mg/dL (ref 0.2–1.2)

## 2014-02-06 LAB — TSH: TSH: 0.68 u[IU]/mL (ref 0.35–4.50)

## 2014-02-06 LAB — MAGNESIUM: Magnesium: 1.7 mg/dL (ref 1.5–2.5)

## 2014-02-06 MED ORDER — FLUCONAZOLE 150 MG PO TABS: ORAL_TABLET | ORAL | Status: DC

## 2014-02-06 NOTE — Progress Notes (Signed)
Subjective:    Patient ID: Krista Newton, female    DOB: Sep 10, 1965, 48 y.o.   MRN: [id number]  HPI Here with several concerns  Having severe leg cramps -day and night  Last night - had to have help getting up  Starts at toes and goes up leg  Going on for a long time - and now getting worse    Dry mouth is worse and worse  Esp at night  Uses nystatin -no help   Yesterday at work - very tired  Just did not feel right   Urine is darker yellow that it used to be  Tries to drink enough water- ? Getting enough    2 weeks ago -she had a ?boil come up on her stomach - it dried up after cleaning with alcohol  Vaginal itching and irritation - off and on for 4 weeks  Some discharge - no odor  No std exposures or new products  She does have a family hx of DM and is obese This is a concern of hers    Patient Active Problem List   Diagnosis Date Noted  . Thrush 09/22/2013  . Shingles 09/18/2013  . Carpal tunnel syndrome 05/31/2013  . Routine general medical examination at a health care facility 07/04/2012  . HEARING LOSS, RIGHT EAR 07/26/2008  . ALLERGIC RHINITIS 06/28/2008   No past medical history on file. Past Surgical History  Procedure Laterality Date  . Cesarean section    . Tubal ligation     History  Substance Use Topics  . Smoking status: Never Smoker   . Smokeless tobacco: Never Used  . Alcohol Use: No   Family History  Problem Relation Age of Onset  . Hypertension Mother   . Diabetes Mother   . Hypertension Father    No Known Allergies Current Outpatient Prescriptions on File Prior to Visit  Medication Sig Dispense Refill  . gabapentin (NEURONTIN) 100 MG capsule Take 1 capsule (100 mg total) by mouth 2 (two) times daily.  60 capsule  1  . naproxen sodium (ANAPROX) 220 MG tablet Take 440 mg by mouth daily.      Marland Kitchen nystatin (MYCOSTATIN) 100000 UNIT/ML suspension Take 5 mLs (500,000 Units total) by mouth 3 (three) times daily. Swish in mouth for 30  seconds and then swallow  120 mL  0  . pyridoxine (B-6) 100 MG tablet Take 100 mg by mouth 2 (two) times a week.       . traMADol (ULTRAM) 50 MG tablet Take 1 tablet (50 mg total) by mouth every 8 (eight) hours as needed for moderate pain.  45 tablet  0  . valACYclovir (VALTREX) 1000 MG tablet Take 1 tablet (1,000 mg total) by mouth 3 (three) times daily.  21 tablet  0   No current facility-administered medications on file prior to visit.   Review of Systems Review of Systems  Constitutional: Negative for fever, appetite change, and unexpected weight change.  Eyes: Negative for pain and visual disturbance.  ENT neg for ST or ear pain but pos for dry throat an dmouth  Respiratory: Negative for cough and shortness of breath.   Cardiovascular: Negative for cp and pos for occ palpitations    Gastrointestinal: Negative for nausea, diarrhea and constipation.  Genitourinary: Negative for urgency and pos for frequency when she drinks more fluid, pos for dry mouth/thirst  Skin: Negative for pallor or rash   MSK pos for cramps in lower legs  Neurological: Negative  for weakness, light-headedness, numbness and headaches.  Hematological: Negative for adenopathy. Does not bruise/bleed easily.  Psychiatric/Behavioral: Negative for dysphoric mood. The patient is not nervous/anxious.         Objective:   Physical Exam  Constitutional: She appears well-developed and well-nourished. No distress.  obese and well appearing   HENT:  Head: Normocephalic and atraumatic.  Right Ear: External ear normal.  Left Ear: External ear normal.  Mouth/Throat: Oropharynx is clear and moist. No oropharyngeal exudate.  Nares are boggy but clear  MMM Scant white coating on tongue/not mouth  Throat clear   Eyes: Conjunctivae and EOM are normal. Pupils are equal, round, and reactive to light. Right eye exhibits no discharge. Left eye exhibits no discharge. No scleral icterus.  Neck: Normal range of motion. Neck supple.  No JVD present. Carotid bruit is not present. No thyromegaly present.  Cardiovascular: Normal rate, regular rhythm, normal heart sounds and intact distal pulses.   Pulmonary/Chest: Effort normal and breath sounds normal. No respiratory distress. She has no wheezes. She has no rales.  Abdominal: Soft. Bowel sounds are normal. She exhibits no distension and no mass. There is no tenderness.  Genitourinary:  slt vulvar hyperemia  Wet prep obt with yeast buds  Scant pale d/c  Musculoskeletal: She exhibits no edema and no tenderness.  No varicosities/ palp cords/tenderness/swelling /warmth or redness Neg homan sign    Lymphadenopathy:    She has no cervical adenopathy.  Neurological: She is alert. She has normal reflexes. No cranial nerve deficit. She exhibits normal muscle tone. Coordination normal.  Skin: Skin is warm and dry. No rash noted. No erythema. No pallor.  Scab on mid abd is healed  Psychiatric: She has a normal mood and affect.          Assessment & Plan:   Problem List Items Addressed This Visit     Digestive   Dry mouth     With thirst  No imp when tx for thrush / presumed  Will check A1C today to screen for DM    Relevant Orders      Hemoglobin A1c     Genitourinary   Yeast vaginitis     With yeast buds on wet prep tx with diflucan 150 today and then again in 3 d orally Update     Relevant Medications      fluconazole (DIFLUCAN) tablet 150 mg     Other   Leg cramps - Primary     Sounds like she is prone to them Disc stretching  Also check labs for cmet today  Tonic water 4 oz prn  Magnesium may help     Relevant Orders      Comprehensive metabolic panel      TSH      Magnesium   Thirst     Pt is at risk for dm due to obesity and fam hx  A1C today and cmet     Relevant Orders      Hemoglobin A1c

## 2014-02-06 NOTE — Assessment & Plan Note (Signed)
Pt is at risk for dm due to obesity and fam hx  A1C today and cmet

## 2014-02-06 NOTE — Assessment & Plan Note (Signed)
Sounds like she is prone to them Disc stretching  Also check labs for cmet today  Tonic water 4 oz prn  Magnesium may help

## 2014-02-06 NOTE — Assessment & Plan Note (Signed)
With yeast buds on wet prep tx with diflucan 150 today and then again in 3 d orally Update

## 2014-02-06 NOTE — Progress Notes (Signed)
Pre visit review using our clinic review tool, if applicable. No additional management support is needed unless otherwise documented below in the visit note. 

## 2014-02-06 NOTE — Assessment & Plan Note (Signed)
With thirst  No imp when tx for thrush / presumed  Will check A1C today to screen for DM

## 2014-02-06 NOTE — Patient Instructions (Signed)
Drink lots of water  Watch sugar in diet  Do leg stretches when able  When you feel a cramp coming - try to move toes and foot up and back  Diet tonic water 4 oz a day may help with cramps  diflcucan - take as directed for yeast infection

## 2014-02-07 LAB — POCT WET PREP (WET MOUNT)
KOH WET PREP POC: NEGATIVE
Trichomonas Wet Prep HPF POC: NEGATIVE

## 2014-02-07 LAB — HEMOGLOBIN A1C: Hgb A1c MFr Bld: 10.3 % — ABNORMAL HIGH (ref 4.6–6.5)

## 2014-02-14 ENCOUNTER — Encounter: Payer: Self-pay | Admitting: Family Medicine

## 2014-02-14 ENCOUNTER — Ambulatory Visit (INDEPENDENT_AMBULATORY_CARE_PROVIDER_SITE_OTHER): Payer: 59 | Admitting: Family Medicine

## 2014-02-14 VITALS — BP 130/90 | HR 88 | Temp 98.8°F | Ht 64.0 in | Wt 253.0 lb

## 2014-02-14 DIAGNOSIS — E119 Type 2 diabetes mellitus without complications: Secondary | ICD-10-CM

## 2014-02-14 NOTE — Progress Notes (Signed)
Pre visit review using our clinic review tool, if applicable. No additional management support is needed unless otherwise documented below in the visit note. 

## 2014-02-14 NOTE — Progress Notes (Signed)
Subjective:    Patient ID: Krista Newton, female    DOB: 04/14/1966, 48 y.o.   MRN: [id number]  HPI Here for a new dx of DM   Wt is up 2 lb with bmi of 45   Lab Results  Component Value Date   HGBA1C 10.3* 02/06/2014    Per pt - her mother had diabetes  Her blood sugar random at that time was 210  Last year her sugar was   She knows to watch her carbs  Has some knowledge   She is not a big sweet eater  She loves bread and french fries   Does not tend to drink sugar  Does drink water   No change in vision  Just had an eye exam - px for glasses did not change  Does have thirst and also tingling hands and feet    Patient Active Problem List   Diagnosis Date Noted  . Leg cramps 02/06/2014  . Yeast vaginitis 02/06/2014  . Dry mouth 02/06/2014  . Thirst 02/06/2014  . Thrush 09/22/2013  . Shingles 09/18/2013  . Carpal tunnel syndrome 05/31/2013  . Routine general medical examination at a health care facility 07/04/2012  . HEARING LOSS, RIGHT EAR 07/26/2008  . ALLERGIC RHINITIS 06/28/2008   No past medical history on file. Past Surgical History  Procedure Laterality Date  . Cesarean section    . Tubal ligation     History  Substance Use Topics  . Smoking status: Never Smoker   . Smokeless tobacco: Never Used  . Alcohol Use: No   Family History  Problem Relation Age of Onset  . Hypertension Mother   . Diabetes Mother   . Hypertension Father    No Known Allergies Current Outpatient Prescriptions on File Prior to Visit  Medication Sig Dispense Refill  . pyridoxine (B-6) 100 MG tablet Take 100 mg by mouth 2 (two) times a week.        No current facility-administered medications on file prior to visit.      Review of Systems Review of Systems  Constitutional: Negative for fever, appetite change, fatigue and unexpected weight change.  Eyes: Negative for pain and visual disturbance.  Respiratory: Negative for cough and shortness of breath.     Cardiovascular: Negative for cp or palpitations    Gastrointestinal: Negative for nausea, diarrhea and constipation.  Genitourinary: Negative for dysuria/ pos for frequency    Pos for thirst  Skin: Negative for pallor or rash   Neurological: Negative for weakness, light-headedness,and headaches.  Hematological: Negative for adenopathy. Does not bruise/bleed easily.  Psychiatric/Behavioral: Negative for dysphoric mood. The patient is not nervous/anxious.         Objective:   Physical Exam  Constitutional: She appears well-developed and well-nourished. No distress.  obese and well appearing   HENT:  Head: Normocephalic and atraumatic.  Mouth/Throat: Oropharynx is clear and moist.  Eyes: Conjunctivae and EOM are normal. Pupils are equal, round, and reactive to light. No scleral icterus.  Neck: Normal range of motion. Neck supple. No JVD present. No thyromegaly present.  Cardiovascular: Normal rate, regular rhythm, normal heart sounds and intact distal pulses.  Exam reveals no gallop.   Pulmonary/Chest: Effort normal and breath sounds normal. No respiratory distress. She has no wheezes. She has no rales.  Abdominal: Soft. Bowel sounds are normal. She exhibits no distension and no mass. There is no tenderness.  Musculoskeletal: She exhibits no edema and no tenderness.  Lymphadenopathy:  She has no cervical adenopathy.  Neurological: She is alert. She has normal reflexes. No cranial nerve deficit. She exhibits normal muscle tone. Coordination normal.  Skin: Skin is warm. No rash noted. She is diaphoretic. No erythema. No pallor.  Psychiatric: She has a normal mood and affect.          Assessment & Plan:   Problem List Items Addressed This Visit     Endocrine   Diabetes mellitus type 2, controlled, without complications - Primary      Lab Results  Component Value Date   HGBA1C 10.3* 02/06/2014   New dx  Rev dz state/expectations and need for lifestyle change in great detail   Wt loss will be esp important  Pt declines pharmacotherapy at this time - she wants to start working on lifestyle change  Rev low glycemic diet and plan for exercise  Recommend ref to DM teaching- she will check out [facility] program and consider where she wants to go-then call for ref  Also given px for DM equipment  F/u in 3 mo with lab prior >25 minutes spent in face to face time with patient, >50% spent in counselling or coordination of care Literature on DM given

## 2014-02-14 NOTE — Patient Instructions (Signed)
I would like you to start some diabetes education  [facility] next door has a good program - drop in and ask for some information when you get a chance I can refer you there or to [facility] or [facility] - let me know what you prefer  Get your flu shot at work Low sugar diet/ watch portions of carbohydrates  Work up to 30 minutes of exercise five days per week  Follow up in 3 months with labs prior

## 2014-02-15 DIAGNOSIS — E119 Type 2 diabetes mellitus without complications: Principal | ICD-10-CM

## 2014-02-15 DIAGNOSIS — E1165 Type 2 diabetes mellitus with hyperglycemia: Secondary | ICD-10-CM | POA: Insufficient documentation

## 2014-02-15 NOTE — Assessment & Plan Note (Signed)
Lab Results  Component Value Date   HGBA1C 10.3* 02/06/2014   New dx  Rev dz state/expectations and need for lifestyle change in great detail  Wt loss will be esp important  Pt declines pharmacotherapy at this time - she wants to start working on lifestyle change  Rev low glycemic diet and plan for exercise  Recommend ref to DM teaching- she will check out [facility] program and consider where she wants to go-then call for ref  Also given px for DM equipment  F/u in 3 mo with lab prior >25 minutes spent in face to face time with patient, >50% spent in counselling or coordination of care Literature on DM given

## 2014-03-05 ENCOUNTER — Encounter: Payer: Self-pay | Admitting: Gynecology

## 2014-03-05 ENCOUNTER — Ambulatory Visit (INDEPENDENT_AMBULATORY_CARE_PROVIDER_SITE_OTHER): Payer: 59 | Admitting: Gynecology

## 2014-03-05 ENCOUNTER — Other Ambulatory Visit (HOSPITAL_COMMUNITY)
Admission: RE | Admit: 2014-03-05 | Discharge: 2014-03-05 | Disposition: A | Discharge status: Home / Self Care | Payer: 59 | Source: Ambulatory Visit | Attending: Gynecology | Admitting: Gynecology

## 2014-03-05 VITALS — BP 120/84 | Ht 65.0 in | Wt 250.0 lb

## 2014-03-05 DIAGNOSIS — Z01419 Encounter for gynecological examination (general) (routine) without abnormal findings: Secondary | ICD-10-CM

## 2014-03-05 DIAGNOSIS — Z780 Asymptomatic menopausal state: Secondary | ICD-10-CM

## 2014-03-05 NOTE — Patient Instructions (Signed)
Schedule colonoscopy with [facility] at [phone] or [facility] at [phone]  Call to Schedule your mammogram  Facilities in [place]: 1)  [facility], [address] Phone: [phone] 2)  [facility], [address] Phone: [phone] 3)  Dr. Bertrand at [facility]  [address] Phone: [phone]     Mammogram A mammogram is an X-ray test to find changes in a woman's breast. You should get a mammogram if:  You are 40 years of age or older  You have risk factors.   Your doctor recommends that you have one.  BEFORE THE TEST  Do not schedule the test the week before your period, especially if your breasts are sore during this time.  On the day of your mammogram:  Wash your breasts and armpits well. After washing, do not put on any deodorant or talcum powder on until after your test.   Eat and drink as you usually do.   Take your medicines as usual.   If you are diabetic and take insulin, make sure you:   Eat before coming for your test.   Take your insulin as usual.   If you cannot keep your appointment, call before the appointment to cancel. Schedule another appointment.  TEST  You will need to undress from the waist up. You will put on a hospital gown.   Your breast will be put on the mammogram machine, and it will press firmly on your breast with a piece of plastic called a compression paddle. This will make your breast flatter so that the machine can X-ray all parts of your breast.   Both breasts will be X-rayed. Each breast will be X-rayed from above and from the side. An X-ray might need to be taken again if the picture is not good enough.   The mammogram will last about 15 to 30 minutes.  AFTER THE TEST Finding out the results of your test Ask when your test results will be ready. Make sure you get your test  results.  Document Released: 07/31/2008 Document Revised: 04/23/2011 Document Reviewed: 07/31/2008 ExitCare Patient Information 2012 [employer].  You may obtain a copy of any labs that were done today by logging onto MyChart as outlined in the instructions provided with your AVS (after visit summary). The office will not call with normal lab results but certainly if there are any significant abnormalities then we will contact you.   Health Maintenance, Female A healthy lifestyle and preventative care can promote health and wellness.  Maintain regular health, dental, and eye exams.  Eat a healthy diet. Foods like vegetables, fruits, whole grains, low-fat dairy products, and lean protein foods contain the nutrients you need without too many calories. Decrease your intake of foods high in solid fats, added sugars, and salt. Get information about a proper diet from your caregiver, if necessary.  Regular physical exercise is one of the most important things you can do for your health. Most adults should get at least 150 minutes of moderate-intensity exercise (any activity that increases your heart rate and causes you to sweat) each week. In addition, most adults need muscle-strengthening exercises on 2 or more days a week.   Maintain a healthy weight. The body mass index (BMI) is a screening tool to identify possible weight problems. It provides an estimate of body fat based on height and weight. Your caregiver can help determine your   BMI, and can help you achieve or maintain a healthy weight. For adults 20 years and older:  A BMI below 18.5 is considered underweight.  A BMI of 18.5 to 24.9 is normal.  A BMI of 25 to 29.9 is considered overweight.  A BMI of 30 and above is considered obese.  Maintain normal blood lipids and cholesterol by exercising and minimizing your intake of saturated fat. Eat a balanced diet with plenty of fruits and vegetables. Blood tests for lipids and cholesterol  should begin at age 20 and be repeated every 5 years. If your lipid or cholesterol levels are high, you are over 50, or you are a high risk for heart disease, you may need your cholesterol levels checked more frequently.Ongoing high lipid and cholesterol levels should be treated with medicines if diet and exercise are not effective.  If you smoke, find out from your caregiver how to quit. If you do not use tobacco, do not start.  Lung cancer screening is recommended for adults aged 55 80 years who are at high risk for developing lung cancer because of a history of smoking. Yearly low-dose computed tomography (CT) is recommended for people who have at least a 30-pack-year history of smoking and are a current smoker or have quit within the past 15 years. A pack year of smoking is smoking an average of 1 pack of cigarettes a day for 1 year (for example: 1 pack a day for 30 years or 2 packs a day for 15 years). Yearly screening should continue until the smoker has stopped smoking for at least 15 years. Yearly screening should also be stopped for people who develop a health problem that would prevent them from having lung cancer treatment.  If you are pregnant, do not drink alcohol. If you are breastfeeding, be very cautious about drinking alcohol. If you are not pregnant and choose to drink alcohol, do not exceed 1 drink per day. One drink is considered to be 12 ounces (355 mL) of beer, 5 ounces (148 mL) of wine, or 1.5 ounces (44 mL) of liquor.  Avoid use of street drugs. Do not share needles with anyone. Ask for help if you need support or instructions about stopping the use of drugs.  High blood pressure causes heart disease and increases the risk of stroke. Blood pressure should be checked at least every 1 to 2 years. Ongoing high blood pressure should be treated with medicines, if weight loss and exercise are not effective.  If you are 55 to 50 years old, ask your caregiver if you should take aspirin  to prevent strokes.  Diabetes screening involves taking a blood sample to check your fasting blood sugar level. This should be done once every 3 years, after age 45, if you are within normal weight and without risk factors for diabetes. Testing should be considered at a younger age or be carried out more frequently if you are overweight and have at least 1 risk factor for diabetes.  Breast cancer screening is essential preventative care for women. You should practice "breast self-awareness." This means understanding the normal appearance and feel of your breasts and may include breast self-examination. Any changes detected, no matter how small, should be reported to a caregiver. Women in their 20s and 30s should have a clinical breast exam (CBE) by a caregiver as part of a regular health exam every 1 to 3 years. After age 40, women should have a CBE every year. Starting at age 40, women   should consider having a mammogram (breast X-ray) every year. Women who have a family history of breast cancer should talk to their caregiver about genetic screening. Women at a high risk of breast cancer should talk to their caregiver about having an MRI and a mammogram every year.  Breast cancer gene (BRCA)-related cancer risk assessment is recommended for women who have family members with BRCA-related cancers. BRCA-related cancers include breast, ovarian, tubal, and peritoneal cancers. Having family members with these cancers may be associated with an increased risk for harmful changes (mutations) in the breast cancer genes BRCA1 and BRCA2. Results of the assessment will determine the need for genetic counseling and BRCA1 and BRCA2 testing.  The Pap test is a screening test for cervical cancer. Women should have a Pap test starting at age 21. Between ages 21 and 29, Pap tests should be repeated every 2 years. Beginning at age 30, you should have a Pap test every 3 years as long as the past 3 Pap tests have been normal. If  you had a hysterectomy for a problem that was not cancer or a condition that could lead to cancer, then you no longer need Pap tests. If you are between ages 65 and 70, and you have had normal Pap tests going back 10 years, you no longer need Pap tests. If you have had past treatment for cervical cancer or a condition that could lead to cancer, you need Pap tests and screening for cancer for at least 20 years after your treatment. If Pap tests have been discontinued, risk factors (such as a new sexual partner) need to be reassessed to determine if screening should be resumed. Some women have medical problems that increase the chance of getting cervical cancer. In these cases, your caregiver may recommend more frequent screening and Pap tests.  The human papillomavirus (HPV) test is an additional test that may be used for cervical cancer screening. The HPV test looks for the virus that can cause the cell changes on the cervix. The cells collected during the Pap test can be tested for HPV. The HPV test could be used to screen women aged 30 years and older, and should be used in women of any age who have unclear Pap test results. After the age of 30, women should have HPV testing at the same frequency as a Pap test.  Colorectal cancer can be detected and often prevented. Most routine colorectal cancer screening begins at the age of 50 and continues through age 75. However, your caregiver may recommend screening at an earlier age if you have risk factors for colon cancer. On a yearly basis, your caregiver may provide home test kits to check for hidden blood in the stool. Use of a small camera at the end of a tube, to directly examine the colon (sigmoidoscopy or colonoscopy), can detect the earliest forms of colorectal cancer. Talk to your caregiver about this at age 50, when routine screening begins. Direct examination of the colon should be repeated every 5 to 10 years through age 75, unless early forms of  pre-cancerous polyps or small growths are found.  Hepatitis C blood testing is recommended for all people born from 1945 through 1965 and any individual with known risks for hepatitis C.  Practice safe sex. Use condoms and avoid high-risk sexual practices to reduce the spread of sexually transmitted infections (STIs). Sexually active women aged 25 and younger should be checked for Chlamydia, which is a common sexually transmitted infection. Older women   with new or multiple partners should also be tested for Chlamydia. Testing for other STIs is recommended if you are sexually active and at increased risk.  Osteoporosis is a disease in which the bones lose minerals and strength with aging. This can result in serious bone fractures. The risk of osteoporosis can be identified using a bone density scan. Women ages 65 and over and women at risk for fractures or osteoporosis should discuss screening with their caregivers. Ask your caregiver whether you should be taking a calcium supplement or vitamin D to reduce the rate of osteoporosis.  Menopause can be associated with physical symptoms and risks. Hormone replacement therapy is available to decrease symptoms and risks. You should talk to your caregiver about whether hormone replacement therapy is right for you.  Use sunscreen. Apply sunscreen liberally and repeatedly throughout the day. You should seek shade when your shadow is shorter than you. Protect yourself by wearing long sleeves, pants, a wide-brimmed hat, and sunglasses year round, whenever you are outdoors.  Notify your caregiver of new moles or changes in moles, especially if there is a change in shape or color. Also notify your caregiver if a mole is larger than the size of a pencil eraser.  Stay current with your immunizations. Document Released: 11/17/2010 Document Revised: 08/29/2012 Document Reviewed: 11/17/2010 ExitCare Patient Information 2014 [employer].   

## 2014-03-05 NOTE — Progress Notes (Signed)
Krista Newton [facility] 06-Feb-1966 [id number]        48 y.o.  [id number] for annual exam.  Several issues noted below.  Past medical history,surgical history, problem list, medications, allergies, family history and social history were all reviewed and documented as reviewed in the EPIC chart.  ROS:  12 system ROS performed with pertinent positives and negatives included in the history, assessment and plan.   Additional significant findings :  none   Exam: Kim Counsellor Vitals:   03/05/14 1534  BP: 120/84  Height: 5\' 5"  (1.651 m)  Weight: 250 lb (113.399 kg)   General appearance:  Normal affect, orientation and appearance. Skin: Grossly normal HEENT: Without gross lesions.  No cervical or supraclavicular adenopathy. Thyroid normal.  Lungs:  Clear without wheezing, rales or rhonchi Cardiac: RR, without RMG Abdominal:  Soft, nontender, without masses, guarding, rebound, organomegaly or hernia Breasts:  Examined lying and sitting without masses, retractions, discharge or axillary adenopathy. Pelvic:  Ext/BUS/vagina normal  Cervix normal. Pap done  Uterus anteverted, normal size, shape and contour, midline and mobile nontender   Adnexa  Without masses or tenderness    Anus and perineum  Normal   Rectovaginal  Normal sphincter tone without palpated masses or tenderness.    Assessment/Plan:  48 y.o. G36P2002 female for annual exam without menses, tubal sterilization.   1. Postmenopausal. Patient over one year without menses. Without significant hot flashes, night sweats, vaginal dryness or dyspareunia. No vaginal bleeding at all. Continue to monitor. Report any vaginal bleeding. 2. Mammography 2007. Strongly recommended screening mammography now she agrees to schedule. SBE monthly reviewed. 3. Pap smear/HPV negative 2013.  Pap done today. Review current screening guidelines. Assuming negative plan repeat Pap smear at 3-5 your interval. 4. Colonoscopy never. Recommend arranging screening  colonoscopy this year she has turned 48. Names and numbers provided. 5. DEXA never. Will plan further into the menopause. Increase calcium vitamin D reviewed. 6. Health maintenance. No routine blood work done as she has this done at her primary physician's office who follows her. Follow up in one year, sooner as needed.     Anastasio Auerbach MD, 3:55 PM 03/05/2014

## 2014-03-05 NOTE — Addendum Note (Signed)
Addended by: Nelva Nay on: 03/05/2014 04:01 PM   Modules accepted: Orders

## 2014-03-06 LAB — URINALYSIS W MICROSCOPIC + REFLEX CULTURE
BILIRUBIN URINE: NEGATIVE
Bacteria, UA: NONE SEEN
CASTS: NONE SEEN
Glucose, UA: NEGATIVE mg/dL
Hgb urine dipstick: NEGATIVE
Ketones, ur: NEGATIVE mg/dL
Leukocytes, UA: NEGATIVE
Nitrite: NEGATIVE
PH: 6 (ref 5.0–8.0)
Protein, ur: NEGATIVE mg/dL
SPECIFIC GRAVITY, URINE: 1.024 (ref 1.005–1.030)
SQUAMOUS EPITHELIAL / LPF: NONE SEEN
Urobilinogen, UA: 1 mg/dL (ref 0.0–1.0)

## 2014-03-07 LAB — CYTOLOGY - PAP

## 2014-03-19 ENCOUNTER — Encounter: Payer: Self-pay | Admitting: Gynecology

## 2014-04-24 HISTORY — PX: CARPAL TUNNEL RELEASE: SHX101

## 2014-05-09 ENCOUNTER — Other Ambulatory Visit (INDEPENDENT_AMBULATORY_CARE_PROVIDER_SITE_OTHER): Payer: 59

## 2014-05-09 DIAGNOSIS — E119 Type 2 diabetes mellitus without complications: Secondary | ICD-10-CM

## 2014-05-09 LAB — HEMOGLOBIN A1C: Hgb A1c MFr Bld: 8.1 % — ABNORMAL HIGH (ref 4.6–6.5)

## 2014-05-16 ENCOUNTER — Ambulatory Visit (INDEPENDENT_AMBULATORY_CARE_PROVIDER_SITE_OTHER): Payer: 59 | Admitting: Family Medicine

## 2014-05-16 ENCOUNTER — Encounter: Payer: Self-pay | Admitting: Family Medicine

## 2014-05-16 VITALS — BP 135/80 | HR 76 | Temp 98.4°F | Ht 64.0 in | Wt 246.8 lb

## 2014-05-16 DIAGNOSIS — E119 Type 2 diabetes mellitus without complications: Secondary | ICD-10-CM

## 2014-05-16 NOTE — Patient Instructions (Signed)
I am pleased with the improvement you have made so far  A1C is 8.1 and goal is below 7 - lets give it 3 more months of diet and exercise Keep watching sugar readings  Get back to exercise when you can  Continue to work on carbs/sugar in diet  Follow up in 3 months with labs prior

## 2014-05-16 NOTE — Progress Notes (Signed)
Pre visit review using our clinic review tool, if applicable. No additional management support is needed unless otherwise documented below in the visit note. 

## 2014-05-16 NOTE — Progress Notes (Signed)
Subjective:    Patient ID: Krista Newton, female    DOB: 08-24-1965, 48 y.o.   MRN: [id number]  HPI Here for f/u for new DM2   Has changed her sugar intake  Had to have carpal tunnel surgery on the 8th - did get off track for a bit   Sugar drinks - does 1/2 and 1/2 - tea -when eating out  At home - she has changed to diet green tea and diet drinks   Not a sweet eater  carbs - working on that -is hard  - cutting back on portions   Started walking 3 days per week - for 30 minutes -- will get back to it now that she is healing from the surgery   Overall surgery went well - stitches out / PT has f/u soon  She is noticing a difference   Wt is down 4 lb with bmi of 42    A1C is down from 10.3 to  8.1 -- a decent improvement   Checking glucose at home  Higher in the am - was told by her program to exercise later 150-160 or 170  Later in the day - usually 125-135   Has appt with nutritionist  Has started training for DM at [facility]   Patient Active Problem List   Diagnosis Date Noted  . Diabetes mellitus type 2, controlled, without complications 65/07/5463  . Leg cramps 02/06/2014  . Yeast vaginitis 02/06/2014  . Dry mouth 02/06/2014  . Thirst 02/06/2014  . Thrush 09/22/2013  . Shingles 09/18/2013  . Carpal tunnel syndrome 05/31/2013  . Routine general medical examination at a health care facility 07/04/2012  . HEARING LOSS, RIGHT EAR 07/26/2008  . ALLERGIC RHINITIS 06/28/2008   No past medical history on file. Past Surgical History  Procedure Laterality Date  . Cesarean section    . Tubal ligation    . Carpal tunnel release Right 04/24/14   History  Substance Use Topics  . Smoking status: Never Smoker   . Smokeless tobacco: Never Used  . Alcohol Use: Yes     Comment: Rare   Family History  Problem Relation Age of Onset  . Hypertension Mother   . Diabetes Mother   . Heart failure Mother   . Hypertension Father   . Diabetes Sister   . Kidney failure  Brother    No Known Allergies Current Outpatient Prescriptions on File Prior to Visit  Medication Sig Dispense Refill  . naproxen sodium (ALEVE) 220 MG tablet Take 440 mg by mouth 2 (two) times daily as needed.     . pyridoxine (B-6) 100 MG tablet Take 100 mg by mouth 2 (two) times a week.      No current facility-administered medications on file prior to visit.    Review of Systems Review of Systems  Constitutional: Negative for fever, appetite change, fatigue and unexpected weight change.  Eyes: Negative for pain and visual disturbance.  Respiratory: Negative for cough and shortness of breath.   Cardiovascular: Negative for cp or palpitations    Gastrointestinal: Negative for nausea, diarrhea and constipation.  Genitourinary: Negative for urgency and frequency.  Skin: Negative for pallor or rash   Neurological: Negative for weakness, light-headedness, numbness and headaches.  Hematological: Negative for adenopathy. Does not bruise/bleed easily.  Psychiatric/Behavioral: Negative for dysphoric mood. The patient is not nervous/anxious.         Objective:   Physical Exam  Constitutional: She appears well-nourished. No distress.  obese and  well appearing   HENT:  Head: Normocephalic and atraumatic.  Mouth/Throat: Oropharynx is clear and moist.  Eyes: Conjunctivae and EOM are normal. Pupils are equal, round, and reactive to light. No scleral icterus.  Neck: Normal range of motion. Neck supple. No JVD present. Carotid bruit is not present. No thyromegaly present.  Cardiovascular: Normal rate, regular rhythm, normal heart sounds and intact distal pulses.   Pulmonary/Chest: Effort normal and breath sounds normal. No respiratory distress. She has no wheezes. She has no rales.  Abdominal: Soft. Bowel sounds are normal.  Musculoskeletal: She exhibits no edema.  Lymphadenopathy:    She has no cervical adenopathy.  Neurological: She is alert. She has normal reflexes.  Skin: Skin is warm  and dry. No rash noted. No erythema. No pallor.  Psychiatric: She has a normal mood and affect.          Assessment & Plan:   Problem List Items Addressed This Visit      Endocrine   Diabetes mellitus type 2, controlled, without complications - Primary    Much improvement in A1c  Lab Results  Component Value Date   HGBA1C 8.1* 05/09/2014   Diet/exercise and DM teaching  Rev glucose readings Commended on good work Will continue education and lifestyle change Disc goals F/u with lab prior in 3 mo

## 2014-05-19 NOTE — Assessment & Plan Note (Signed)
Much improvement in A1c  Lab Results  Component Value Date   HGBA1C 8.1* 05/09/2014   Diet/exercise and DM teaching  Rev glucose readings Commended on good work Will continue education and lifestyle change Disc goals F/u with lab prior in 3 mo

## 2014-08-05 ENCOUNTER — Telehealth: Payer: Self-pay | Admitting: Family Medicine

## 2014-08-05 DIAGNOSIS — E119 Type 2 diabetes mellitus without complications: Secondary | ICD-10-CM

## 2014-08-05 NOTE — Telephone Encounter (Signed)
-----   Message from Ellamae Sia sent at 08/01/2014  2:51 PM EDT ----- Regarding: Lab orders for Monday, 3.21.16 Lab orders for 3 month f/u

## 2014-08-06 ENCOUNTER — Other Ambulatory Visit: Payer: Self-pay

## 2014-08-08 ENCOUNTER — Ambulatory Visit: Payer: Self-pay | Admitting: Family Medicine

## 2014-08-13 ENCOUNTER — Other Ambulatory Visit: Payer: 59

## 2014-08-15 ENCOUNTER — Ambulatory Visit: Payer: 59 | Admitting: Family Medicine

## 2014-08-24 ENCOUNTER — Other Ambulatory Visit (INDEPENDENT_AMBULATORY_CARE_PROVIDER_SITE_OTHER): Payer: 59

## 2014-08-24 DIAGNOSIS — E119 Type 2 diabetes mellitus without complications: Secondary | ICD-10-CM | POA: Diagnosis not present

## 2014-08-24 LAB — LIPID PANEL
CHOL/HDL RATIO: 4
Cholesterol: 204 mg/dL — ABNORMAL HIGH (ref 0–200)
HDL: 48.8 mg/dL (ref >39.00)
LDL Cholesterol: 130 mg/dL — ABNORMAL HIGH (ref 0–99)
NonHDL: 155.2
Triglycerides: 127 mg/dL (ref 0.0–149.0)
VLDL: 25.4 mg/dL (ref 0.0–40.0)

## 2014-08-24 LAB — BASIC METABOLIC PANEL
BUN: 13 mg/dL (ref 6–23)
CHLORIDE: 103 meq/L (ref 96–112)
CO2: 25 mEq/L (ref 19–32)
Calcium: 9.3 mg/dL (ref 8.4–10.5)
Creatinine, Ser: 0.65 mg/dL (ref 0.40–1.20)
GFR: 123.8 mL/min (ref >60.00)
Glucose, Bld: 192 mg/dL — ABNORMAL HIGH (ref 70–99)
POTASSIUM: 3.9 meq/L (ref 3.5–5.1)
SODIUM: 136 meq/L (ref 135–145)

## 2014-08-24 LAB — HEMOGLOBIN A1C: HEMOGLOBIN A1C: 7.6 % — AB (ref 4.6–6.5)

## 2014-08-28 ENCOUNTER — Ambulatory Visit: Payer: Self-pay | Admitting: Family Medicine

## 2014-08-31 ENCOUNTER — Encounter: Payer: Self-pay | Admitting: Family Medicine

## 2014-08-31 ENCOUNTER — Ambulatory Visit (INDEPENDENT_AMBULATORY_CARE_PROVIDER_SITE_OTHER): Payer: 59 | Admitting: Family Medicine

## 2014-08-31 VITALS — BP 132/88 | HR 88 | Temp 98.3°F | Ht 64.0 in | Wt 246.1 lb

## 2014-08-31 DIAGNOSIS — E1169 Type 2 diabetes mellitus with other specified complication: Secondary | ICD-10-CM | POA: Insufficient documentation

## 2014-08-31 DIAGNOSIS — E785 Hyperlipidemia, unspecified: Secondary | ICD-10-CM | POA: Diagnosis not present

## 2014-08-31 DIAGNOSIS — E119 Type 2 diabetes mellitus without complications: Secondary | ICD-10-CM

## 2014-08-31 NOTE — Patient Instructions (Signed)
Try to reduce cholesterol in diet Avoid red meat/ fried foods/ egg yolks/ fatty breakfast meats/ butter, cheese and high fat dairy/ and shellfish   LDL cholesterol is 130 with goal of less than 100  Get back to exercise when you can A1C is down from 8.1 to 7.6 - making progress   Your eye exam is due in May-don't forget to schedule that   Follow up in 3 months with labs prior

## 2014-08-31 NOTE — Progress Notes (Signed)
Subjective:    Patient ID: Krista Newton, female    DOB: 1965/08/04, 48 y.o.   MRN: [id number]  HPI Here for f/u of DM  Wt is stable bmi of 42   Diabetes Home sugar results - higher in the am lately - up to 180s , lower during the day - 130s  DM diet - is doing fairly well - keeping sugars out of diet  Exercise recovering from carpal tunnel - and then GI bug - is doing better and will be able to start walking  Symptoms -none  A1C last  Lab Results  Component Value Date   HGBA1C 7.6* 08/24/2014  this is down from 8.1 Doing this without any medication   No problems with medications  Renal protection -not on ace currently  Last eye exam  5/15 , vision is ok   Lipid screen Lab Results  Component Value Date   CHOL 204* 08/24/2014   CHOL 200 07/04/2012   Lab Results  Component Value Date   HDL 48.80 08/24/2014   HDL 54.30 07/04/2012   Lab Results  Component Value Date   LDLCALC 130* 08/24/2014   LDLCALC 130* 07/04/2012   Lab Results  Component Value Date   TRIG 127.0 08/24/2014   TRIG 77.0 07/04/2012   Lab Results  Component Value Date   CHOLHDL 4 08/24/2014   CHOLHDL 4 07/04/2012   No results found for: LDLDIRECT    She does eat red meat Not a lot of greasy food  Some bacon   Patient Active Problem List   Diagnosis Date Noted  . Diabetes mellitus type 2, controlled, without complications 44/05/270  . Leg cramps 02/06/2014  . Yeast vaginitis 02/06/2014  . Dry mouth 02/06/2014  . Thirst 02/06/2014  . Thrush 09/22/2013  . Shingles 09/18/2013  . Carpal tunnel syndrome 05/31/2013  . Routine general medical examination at a health care facility 07/04/2012  . HEARING LOSS, RIGHT EAR 07/26/2008  . ALLERGIC RHINITIS 06/28/2008   No past medical history on file. Past Surgical History  Procedure Laterality Date  . Cesarean section    . Tubal ligation    . Carpal tunnel release Right 04/24/14   History  Substance Use Topics  . Smoking status: Never  Smoker   . Smokeless tobacco: Never Used  . Alcohol Use: Yes     Comment: Rare   Family History  Problem Relation Age of Onset  . Hypertension Mother   . Diabetes Mother   . Heart failure Mother   . Hypertension Father   . Diabetes Sister   . Kidney failure Brother    No Known Allergies Current Outpatient Prescriptions on File Prior to Visit  Medication Sig Dispense Refill  . naproxen sodium (ALEVE) 220 MG tablet Take 440 mg by mouth 2 (two) times daily as needed.     . pyridoxine (B-6) 100 MG tablet Take 100 mg by mouth 2 (two) times a week.      No current facility-administered medications on file prior to visit.       Review of Systems    Review of Systems  Constitutional: Negative for fever, appetite change, fatigue and unexpected weight change.  Eyes: Negative for pain and visual disturbance.  Respiratory: Negative for cough and shortness of breath.   Cardiovascular: Negative for cp or palpitations    Gastrointestinal: Negative for nausea, diarrhea and constipation.  Genitourinary: Negative for urgency and frequency.  Skin: Negative for pallor or rash   Neurological: Negative  for weakness, light-headedness, numbness and headaches.  Hematological: Negative for adenopathy. Does not bruise/bleed easily.  Psychiatric/Behavioral: Negative for dysphoric mood. The patient is not nervous/anxious.      Objective:   Physical Exam  Constitutional: She appears well-developed and well-nourished. No distress.  obese and well appearing   HENT:  Head: Normocephalic and atraumatic.  Mouth/Throat: Oropharynx is clear and moist.  Eyes: Conjunctivae and EOM are normal. Pupils are equal, round, and reactive to light.  Neck: Normal range of motion. Neck supple. No JVD present. Carotid bruit is not present. No thyromegaly present.  Cardiovascular: Normal rate, regular rhythm, normal heart sounds and intact distal pulses.  Exam reveals no gallop.   Pulmonary/Chest: Effort normal and  breath sounds normal. No respiratory distress. She has no wheezes. She has no rales.  No crackles  Abdominal: Soft. Bowel sounds are normal. She exhibits no distension, no abdominal bruit and no mass. There is no tenderness.  Musculoskeletal: She exhibits no edema.  Lymphadenopathy:    She has no cervical adenopathy.  Neurological: She is alert. She has normal reflexes.  Nl sens in feet  Skin: Skin is warm and dry. No rash noted.  Psychiatric: She has a normal mood and affect.          Assessment & Plan:   Problem List Items Addressed This Visit      Endocrine   Diabetes mellitus type 2, controlled, without complications - Primary    Lab Results  Component Value Date   HGBA1C 7.6* 08/24/2014   This is coming down with lifestyle change Stressed imp of wt loss as well  She wants to continue to manage w/o medication to see if she can get to goal of A1C under 7 Lab and f/u 3 mo          Other   Hyperlipidemia    Not at goal of LDL under 100 for a diabetic  Disc goals for lipids and reasons to control them Rev labs with pt Rev low sat fat diet in detail Will work on this - re check in 3 mo  Consider statin if not at goal

## 2014-08-31 NOTE — Progress Notes (Signed)
Pre visit review using our clinic review tool, if applicable. No additional management support is needed unless otherwise documented below in the visit note. 

## 2014-09-02 NOTE — Assessment & Plan Note (Signed)
Lab Results  Component Value Date   HGBA1C 7.6* 08/24/2014   This is coming down with lifestyle change Stressed imp of wt loss as well  She wants to continue to manage w/o medication to see if she can get to goal of A1C under 7 Lab and f/u 3 mo

## 2014-09-02 NOTE — Assessment & Plan Note (Signed)
Not at goal of LDL under 100 for a diabetic  Disc goals for lipids and reasons to control them Rev labs with pt Rev low sat fat diet in detail Will work on this - re check in 3 mo  Consider statin if not at goal

## 2015-01-10 ENCOUNTER — Telehealth: Payer: Self-pay

## 2015-01-10 MED ORDER — SCOPOLAMINE 1 MG/3DAYS TD PT72: 1.0000 | MEDICATED_PATCH | TRANSDERMAL | Status: DC

## 2015-01-10 NOTE — Telephone Encounter (Signed)
Pt request trans derm scope patches for motion sickness; pt going on cruise for 7 days; pt leaving on 02/16/2015 . [facility].Please advise. Pt last seen 08/31/14.

## 2015-01-10 NOTE — Telephone Encounter (Signed)
I sent it in 

## 2015-01-11 NOTE — Telephone Encounter (Signed)
Left voicemail letting pt know Rx sent to pharmacy  

## 2015-02-17 LAB — HM DIABETES EYE EXAM

## 2015-05-31 ENCOUNTER — Ambulatory Visit: Payer: 59

## 2015-06-05 ENCOUNTER — Ambulatory Visit (INDEPENDENT_AMBULATORY_CARE_PROVIDER_SITE_OTHER): Payer: 59 | Admitting: *Deleted

## 2015-06-05 DIAGNOSIS — Z111 Encounter for screening for respiratory tuberculosis: Secondary | ICD-10-CM | POA: Diagnosis not present

## 2015-06-07 ENCOUNTER — Other Ambulatory Visit (INDEPENDENT_AMBULATORY_CARE_PROVIDER_SITE_OTHER): Payer: 59

## 2015-06-07 DIAGNOSIS — Z23 Encounter for immunization: Secondary | ICD-10-CM | POA: Diagnosis not present

## 2015-06-07 DIAGNOSIS — E785 Hyperlipidemia, unspecified: Secondary | ICD-10-CM

## 2015-06-07 DIAGNOSIS — E119 Type 2 diabetes mellitus without complications: Secondary | ICD-10-CM

## 2015-06-07 LAB — TB SKIN TEST
Induration: 0 mm
TB Skin Test: NEGATIVE

## 2015-07-05 ENCOUNTER — Other Ambulatory Visit: Payer: Self-pay | Admitting: Family Medicine

## 2015-07-05 ENCOUNTER — Encounter: Payer: Self-pay | Admitting: Family Medicine

## 2015-07-05 ENCOUNTER — Telehealth: Payer: Self-pay

## 2015-07-05 ENCOUNTER — Ambulatory Visit (INDEPENDENT_AMBULATORY_CARE_PROVIDER_SITE_OTHER): Payer: 59 | Admitting: Family Medicine

## 2015-07-05 VITALS — BP 148/68 | HR 82 | Temp 98.2°F | Ht 64.0 in | Wt 245.8 lb

## 2015-07-05 DIAGNOSIS — E119 Type 2 diabetes mellitus without complications: Secondary | ICD-10-CM

## 2015-07-05 DIAGNOSIS — I10 Essential (primary) hypertension: Secondary | ICD-10-CM

## 2015-07-05 DIAGNOSIS — E785 Hyperlipidemia, unspecified: Secondary | ICD-10-CM | POA: Diagnosis not present

## 2015-07-05 LAB — LIPID PANEL
CHOLESTEROL: 238 mg/dL — AB (ref 0–200)
HDL: 55.1 mg/dL (ref >39.00)
LDL CALC: 168 mg/dL — AB (ref 0–99)
NonHDL: 182.71
TRIGLYCERIDES: 74 mg/dL (ref 0.0–149.0)
Total CHOL/HDL Ratio: 4
VLDL: 14.8 mg/dL (ref 0.0–40.0)

## 2015-07-05 LAB — COMPREHENSIVE METABOLIC PANEL
ALBUMIN: 4.3 g/dL (ref 3.5–5.2)
ALT: 17 U/L (ref 0–35)
AST: 14 U/L (ref 0–37)
Alkaline Phosphatase: 77 U/L (ref 39–117)
BUN: 13 mg/dL (ref 6–23)
CALCIUM: 9.6 mg/dL (ref 8.4–10.5)
CO2: 28 mEq/L (ref 19–32)
Chloride: 103 mEq/L (ref 96–112)
Creatinine, Ser: 0.62 mg/dL (ref 0.40–1.20)
GFR: 130.29 mL/min (ref >60.00)
Glucose, Bld: 182 mg/dL — ABNORMAL HIGH (ref 70–99)
POTASSIUM: 4 meq/L (ref 3.5–5.1)
Sodium: 137 mEq/L (ref 135–145)
Total Bilirubin: 0.8 mg/dL (ref 0.2–1.2)
Total Protein: 7.7 g/dL (ref 6.0–8.3)

## 2015-07-05 LAB — CBC WITH DIFFERENTIAL/PLATELET
BASOS PCT: 0.5 % (ref 0.0–3.0)
Basophils Absolute: 0 10*3/uL (ref 0.0–0.1)
Eosinophils Absolute: 0.1 10*3/uL (ref 0.0–0.7)
Eosinophils Relative: 2.3 % (ref 0.0–5.0)
HEMATOCRIT: 39.4 % (ref 36.0–46.0)
HEMOGLOBIN: 13.4 g/dL (ref 12.0–15.0)
LYMPHS PCT: 38 % (ref 12.0–46.0)
Lymphs Abs: 2.3 10*3/uL (ref 0.7–4.0)
MCHC: 34.1 g/dL (ref 30.0–36.0)
MCV: 88 fl (ref 78.0–100.0)
MONO ABS: 0.4 10*3/uL (ref 0.1–1.0)
Monocytes Relative: 6.5 % (ref 3.0–12.0)
NEUTROS ABS: 3.2 10*3/uL (ref 1.4–7.7)
Neutrophils Relative %: 52.7 % (ref 43.0–77.0)
Platelets: 272 10*3/uL (ref 150.0–400.0)
RBC: 4.48 Mil/uL (ref 3.87–5.11)
RDW: 13.3 % (ref 11.5–15.5)
WBC: 6 10*3/uL (ref 4.0–10.5)

## 2015-07-05 LAB — HEMOGLOBIN A1C: Hgb A1c MFr Bld: 9 % — ABNORMAL HIGH (ref 4.6–6.5)

## 2015-07-05 LAB — TSH: TSH: 1.61 u[IU]/mL (ref 0.35–4.50)

## 2015-07-05 MED ORDER — LISINOPRIL 10 MG PO TABS: 10.0000 mg | ORAL_TABLET | Freq: Every day | ORAL | Status: DC

## 2015-07-05 NOTE — Telephone Encounter (Signed)
Pt was seen earlier today; pt has not picked up the lisinopril yet from the pharmacy; pt was doing research and pt is concerned about lisinopril possibly causing swelling in face, throat and lips in african americans. Pt wants to know if different BP med can be sent to [facility]. Pt understands she needs BP med but does not want to take med that will cause other problems. Pt request cb.

## 2015-07-05 NOTE — Progress Notes (Signed)
Pre visit review using our clinic review tool, if applicable. No additional management support is needed unless otherwise documented below in the visit note. 

## 2015-07-05 NOTE — Telephone Encounter (Signed)
Left detailed message on voicemail.   Later called back and spoke with patient.  Patient understands.

## 2015-07-05 NOTE — Patient Instructions (Signed)
Start lisinopril  Do your best with limited time to work on healthier diet and cut carbs Lab today for sugar/cholesterol/chemistries Follow up with me in about a month If any side effects from blood pressure medicine-stop it and let me know

## 2015-07-05 NOTE — Progress Notes (Signed)
Subjective:    Patient ID: Krista Newton, female    DOB: 1965/12/07, 50 y.o.   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  Her bp has been fluctuating in class and it was recommended she comes in   0000000 systolic One day [id number]  BP Readings from Last 3 Encounters:  07/05/15 148/68  08/31/14 132/88  05/16/14 135/80    Has a family hx of hypertension  Both parents   Diet is not good  Convenience eating -no time to eat  Eating out/carry out more often   No time for exercise at all  No time to care for herself at all   Stress - not too high  Not aware of it   Wt is stable with bmi of 42 (Morbid obese)   Blood sugar has been elevated also in class-it fluctuates Had a reading of 224 today - that was fasting  Due for A1C Last visit was in April  Had lost her job   Has done DM ed at [facility]    Patient Active Problem List   Diagnosis Date Noted  . Hyperlipidemia 08/31/2014  . Diabetes mellitus type 2, controlled, without complications ([place]) [id number]  . Leg cramps 02/06/2014  . Yeast vaginitis 02/06/2014  . Dry mouth 02/06/2014  . Thirst 02/06/2014  . Thrush 09/22/2013  . Shingles 09/18/2013  . Carpal tunnel syndrome 05/31/2013  . Routine general medical examination at a health care facility 07/04/2012  . HEARING LOSS, RIGHT EAR 07/26/2008  . ALLERGIC RHINITIS 06/28/2008   No past medical history on file. Past Surgical History  Procedure Laterality Date  . Cesarean section    . Tubal ligation    . Carpal tunnel release Right 04/24/14   Social History  Substance Use Topics  . Smoking status: Never Smoker   . Smokeless tobacco: Never Used  . Alcohol Use: 0.0 oz/week    0 Standard drinks or equivalent per week     Comment: Rare   Family History  Problem Relation Age of Onset  . Hypertension Mother   . Diabetes Mother   . Heart failure Mother   . Hypertension Father   . Diabetes Sister   . Kidney  failure Brother    No Known Allergies Current Outpatient Prescriptions on File Prior to Visit  Medication Sig Dispense Refill  . ONE TOUCH ULTRA TEST test strip   3  . naproxen sodium (ALEVE) 220 MG tablet Take 440 mg by mouth 2 (two) times daily as needed. Reported on 07/05/2015     No current facility-administered medications on file prior to visit.     Review of Systems    Review of Systems  Constitutional: Negative for fever, appetite change, fatigue and unexpected weight change.  Eyes: Negative for pain and visual disturbance.  Respiratory: Negative for cough and shortness of breath.   Cardiovascular: Negative for cp or palpitations    Gastrointestinal: Negative for nausea, diarrhea and constipation.  Genitourinary: Negative for urgency and frequency.  Skin: Negative for pallor or rash   Neurological: Negative for weakness, light-headedness, numbness and pos for headaches.  Hematological: Negative for adenopathy. Does not bruise/bleed easily.  Psychiatric/Behavioral: Negative for dysphoric mood. The patient is not nervous/anxious.      Objective:   Physical Exam  Constitutional: She appears well-developed and well-nourished. No distress.  Morbidly obese and well appearing   HENT:  Head: Normocephalic and atraumatic.  Mouth/Throat:  Oropharynx is clear and moist.  Eyes: Conjunctivae and EOM are normal. Pupils are equal, round, and reactive to light.  Neck: Normal range of motion. Neck supple. No JVD present. Carotid bruit is not present. No thyromegaly present.  Cardiovascular: Normal rate, regular rhythm, normal heart sounds and intact distal pulses.  Exam reveals no gallop.   Pulmonary/Chest: Effort normal and breath sounds normal. No respiratory distress. She has no wheezes. She has no rales.  No crackles  Abdominal: Soft. Bowel sounds are normal. She exhibits no distension, no abdominal bruit and no mass. There is no tenderness.  Musculoskeletal: She exhibits no edema.    Lymphadenopathy:    She has no cervical adenopathy.  Neurological: She is alert. She has normal reflexes.  Skin: Skin is warm and dry. No rash noted.  Psychiatric: She has a normal mood and affect.          Assessment & Plan:   Problem List Items Addressed This Visit      Cardiovascular and Mediastinum   Essential hypertension - Primary    bp in fair control at this time  BP Readings from Last 1 Encounters:  07/05/15 148/68   No changes needed Disc lifstyle change with low sodium diet and exercise  Labs today  Will start ace (lisinopril) in light of DM2 as well for renal protection  No hx of drug all or anaphylaxis with anything in the past       Relevant Medications   lisinopril (PRINIVIL,ZESTRIL) 10 MG tablet   Other Relevant Orders   CBC with Differential/Platelet (Completed)   Comprehensive metabolic panel (Completed)   TSH (Completed)   Lipid panel (Completed)     Endocrine   Diabetes mellitus type 2, controlled, without complications ([place])    [id number] today Overdue for check  Her home glucose readings at home are elevated       Relevant Medications   lisinopril (PRINIVIL,ZESTRIL) 10 MG tablet   Other Relevant Orders   Hemoglobin A1c (Completed)     Other   Hyperlipidemia    Labs today  Rev low sat fat diet  Rev goals for lipid control with diabetes       Relevant Medications   lisinopril (PRINIVIL,ZESTRIL) 10 MG tablet   Other Relevant Orders   Lipid panel (Completed)   Morbid obesity ([place])    Discussed how this problem influences overall health and the risks it imposes  Reviewed plan for weight loss with lower calorie diet (via better food choices and also portion control or program like weight watchers) and exercise building up to or more than 30 minutes 5 days per week including some aerobic activity   Disc impact on DM and other problems

## 2015-07-05 NOTE — Telephone Encounter (Signed)
Unless she has a previous hx of anaphylaxis to drugs or bee stings - I do not avoid px it in that patient population. If she has had a severe allergic rxn in the past to food or insect bite or medicines please let me know and do not fill it. An alternative would be losartan (that is an ARB)- it also offers kidney protection for diabetics - but I think it would have the same warning   Let me know what she thinks or if she has any problems

## 2015-07-07 NOTE — Assessment & Plan Note (Signed)
Discussed how this problem influences overall health and the risks it imposes  Reviewed plan for weight loss with lower calorie diet (via better food choices and also portion control or program like weight watchers) and exercise building up to or more than 30 minutes 5 days per week including some aerobic activity   Disc impact on DM and other problems

## 2015-07-07 NOTE — Assessment & Plan Note (Signed)
Labs today  Rev low sat fat diet  Rev goals for lipid control with diabetes

## 2015-07-07 NOTE — Assessment & Plan Note (Signed)
A1C today Overdue for check  Her home glucose readings at home are elevated

## 2015-07-07 NOTE — Assessment & Plan Note (Signed)
bp in fair control at this time  BP Readings from Last 1 Encounters:  07/05/15 148/68   No changes needed Disc lifstyle change with low sodium diet and exercise  Labs today  Will start ace (lisinopril) in light of DM2 as well for renal protection  No hx of drug all or anaphylaxis with anything in the past

## 2015-07-08 ENCOUNTER — Encounter: Payer: Self-pay | Admitting: *Deleted

## 2015-08-02 ENCOUNTER — Encounter: Payer: Self-pay | Admitting: Family Medicine

## 2015-08-02 ENCOUNTER — Ambulatory Visit (INDEPENDENT_AMBULATORY_CARE_PROVIDER_SITE_OTHER): Payer: 59 | Admitting: Family Medicine

## 2015-08-02 VITALS — BP 150/86 | HR 108 | Temp 98.9°F | Ht 64.0 in | Wt 247.5 lb

## 2015-08-02 DIAGNOSIS — I10 Essential (primary) hypertension: Secondary | ICD-10-CM

## 2015-08-02 DIAGNOSIS — E785 Hyperlipidemia, unspecified: Secondary | ICD-10-CM | POA: Diagnosis not present

## 2015-08-02 DIAGNOSIS — E119 Type 2 diabetes mellitus without complications: Secondary | ICD-10-CM | POA: Diagnosis not present

## 2015-08-02 MED ORDER — GLIPIZIDE ER 5 MG PO TB24: 5.0000 mg | ORAL_TABLET | Freq: Every day | ORAL | Status: DC

## 2015-08-02 MED ORDER — HYDROCHLOROTHIAZIDE 25 MG PO TABS: 25.0000 mg | ORAL_TABLET | Freq: Every day | ORAL | Status: DC

## 2015-08-02 NOTE — Progress Notes (Signed)
Pre visit review using our clinic review tool, if applicable. No additional management support is needed unless otherwise documented below in the visit note. 

## 2015-08-02 NOTE — Progress Notes (Signed)
Subjective:    Patient ID: Krista Newton, female    DOB: [birth year], 50 y.o.   MRN: [id number]  HPI Here for f/u of DM and also HTN   Decided not to take the lisinopril - afraid of all rxn  She has been watching her salt and diet the best you can  Schedule does not allow exercise   Is willing to try a fluid pill -understands it will make her urinate more frequently   Lab Results  Component Value Date   HGBA1C 9.0* 07/05/2015  she has been checking her blood sugar and it tends to run 170s to [place] it before and sometimes after  Was 227 this am Does noticed dry mouth , sometimes she urinates a lots  No numbness in the fingers and toes   Not eating sweets No sugar beverages  Tea is 1/2 and 1/2 - she can change to splenda   Has gone to diabetic teaching classes  Still has materials to review   Not able to really watch her diet due to her schedule  Exercise is out of the question for now   Has family history of metformin starting problems - thinks the metformin caused kidney failure Her husband - is having kidney issues also   Would be open to glipizide      Chemistry      Component Value Date/Time   NA 137 07/05/2015 1344   K 4.0 07/05/2015 1344   CL 103 07/05/2015 1344   CO2 28 07/05/2015 1344   BUN 13 07/05/2015 1344   CREATININE 0.62 07/05/2015 1344      Component Value Date/Time   CALCIUM 9.6 07/05/2015 1344   ALKPHOS 77 07/05/2015 1344   AST 14 07/05/2015 1344   ALT 17 07/05/2015 1344   BILITOT 0.8 07/05/2015 1344      Eats breakfast about 9 and then lunch 1:30 , and evening meal - usually on the later side    Patient Active Problem List   Diagnosis Date Noted  . Morbid obesity ([place]) 07/07/2015  . Essential hypertension 07/05/2015  . Hyperlipidemia 08/31/2014  . Diabetes mellitus type 2, controlled, without complications ([place]) [id number]  . Leg cramps 02/06/2014  . Yeast vaginitis 02/06/2014  . Dry mouth 02/06/2014  . Thirst 02/06/2014    . Thrush 09/22/2013  . Shingles 09/18/2013  . Carpal tunnel syndrome 05/31/2013  . Routine general medical examination at a health care facility 07/04/2012  . HEARING LOSS, RIGHT EAR 07/26/2008  . ALLERGIC RHINITIS 06/28/2008   No past medical history on file. Past Surgical History  Procedure Laterality Date  . Cesarean section    . Tubal ligation    . Carpal tunnel release Right 04/24/14   Social History  Substance Use Topics  . Smoking status: Never Smoker   . Smokeless tobacco: Never Used  . Alcohol Use: No   Family History  Problem Relation Age of Onset  . Hypertension Mother   . Diabetes Mother   . Heart failure Mother   . Hypertension Father   . Diabetes Sister   . Kidney failure Brother    No Known Allergies Current Outpatient Prescriptions on File Prior to Visit  Medication Sig Dispense Refill  . glucose blood (ONE TOUCH ULTRA TEST) test strip TEST SUGAR TWICE A DAY AND AS NEEDED (dx. E11.9) 100 each 1  . naproxen sodium (ALEVE) 220 MG tablet Take 440 mg by mouth 2 (two) times daily as needed. Reported on 07/05/2015  No current facility-administered medications on file prior to visit.    Review of Systems    Review of Systems  Constitutional: Negative for fever, appetite change, and unexpected weight change.  Eyes: Negative for pain and visual disturbance.  Respiratory: Negative for cough and shortness of breath.   Cardiovascular: Negative for cp or palpitations    Gastrointestinal: Negative for nausea, diarrhea and constipation.  Genitourinary: Negative for urgency and frequency.  Skin: Negative for pallor or rash   Neurological: Negative for weakness, light-headedness, numbness and headaches.  Hematological: Negative for adenopathy. Does not bruise/bleed easily.  Psychiatric/Behavioral: Negative for dysphoric mood. The patient is not nervous/anxious. Pos for stressors      Objective:   Physical Exam  Constitutional: She appears well-developed and  well-nourished. No distress.  Morbidly obese and well appearing   HENT:  Head: Normocephalic and atraumatic.  Mouth/Throat: Oropharynx is clear and moist.  Eyes: Conjunctivae and EOM are normal. Pupils are equal, round, and reactive to light.  Neck: Normal range of motion. Neck supple. No JVD present. Carotid bruit is not present. No thyromegaly present.  Cardiovascular: Normal rate, regular rhythm, normal heart sounds and intact distal pulses.  Exam reveals no gallop.   Pulmonary/Chest: Effort normal and breath sounds normal. No respiratory distress. She has no wheezes. She has no rales.  No crackles  Abdominal: Soft. Bowel sounds are normal. She exhibits no distension, no abdominal bruit and no mass. There is no tenderness.  Musculoskeletal: She exhibits no edema.  Lymphadenopathy:    She has no cervical adenopathy.  Neurological: She is alert. She has normal reflexes.  Skin: Skin is warm and dry. No rash noted.  Psychiatric: She has a normal mood and affect.          Assessment & Plan:   Problem List Items Addressed This Visit      Cardiovascular and Mediastinum   Essential hypertension - Primary    BP: (!) 150/86 mmHg    Will start hctz 25 mg once daily  She is too afraid to take lisinopril re: pot side eff of allergy (disc that ace and arb are renal protective)  Rev low sodium/less processed food diet- she has little time for self care at this point  Exercise and wt loss would also help      Relevant Medications   hydrochlorothiazide (HYDRODIURIL) 25 MG tablet     Endocrine   Diabetes mellitus type 2, controlled, without complications ([place])    Lab Results  Component Value Date   HGBA1C 9.0* 07/05/2015   Reviewed blood glucose levels  Disc lack of time for meal planning/etc-has done DM teaching in the past  She wants to avoid metformin based on problems her family members have had on it (renal) Will try glipizide xl 5 mg -disc need to watch for hypoglycemia /not  skip meals Poss side eff disc  Lab and f/u 3 mo  She will continue to watch glucose levels/diet/exercise when able       Relevant Medications   glipiZIDE (GLUCOTROL XL) 5 MG 24 hr tablet     Other   Hyperlipidemia    LDL is high Disc goals for lipids and reasons to control them Rev labs with pt Rev low sat fat diet in detail Will disc further at next visit - not a lot of time with self care -will work on HTN and DM first  Unsure if pt will be open to statin       Relevant Medications  hydrochlorothiazide (HYDRODIURIL) 25 MG tablet

## 2015-08-02 NOTE — Patient Instructions (Signed)
For blood pressure-we will start with hctz (fluid pill)- once daily in am  For diabetes- glipizide xl - 5 mg in am with breakfast - do not skip meals with this - do not want to run you low  If you notice any problems -please let us know  Do the best you can with low fat and low sugar diet   We will address cholesterol later - do look at the diet handout    Follow up in 3 months with labs prior

## 2015-08-04 NOTE — Assessment & Plan Note (Signed)
LDL is high Disc goals for lipids and reasons to control them Rev labs with pt Rev low sat fat diet in detail Will disc further at next visit - not a lot of time with self care -will work on HTN and DM first  Unsure if pt will be open to statin

## 2015-08-04 NOTE — Assessment & Plan Note (Signed)
Lab Results  Component Value Date   HGBA1C 9.0* 07/05/2015   Reviewed blood glucose levels  Disc lack of time for meal planning/etc-has done DM teaching in the past  She wants to avoid metformin based on problems her family members have had on it (renal) Will try glipizide xl 5 mg -disc need to watch for hypoglycemia /not skip meals Poss side eff disc  Lab and f/u 3 mo  She will continue to watch glucose levels/diet/exercise when able

## 2015-08-04 NOTE — Assessment & Plan Note (Addendum)
BP: (!) 150/86 mmHg    Will start hctz 25 mg once daily  She is too afraid to take lisinopril re: pot side eff of allergy (disc that ace and arb are renal protective)  Rev low sodium/less processed food diet- she has little time for self care at this point  Exercise and wt loss would also help

## 2015-10-23 ENCOUNTER — Other Ambulatory Visit (INDEPENDENT_AMBULATORY_CARE_PROVIDER_SITE_OTHER): Payer: Commercial Managed Care - HMO

## 2015-10-23 ENCOUNTER — Other Ambulatory Visit: Payer: 59

## 2015-10-23 DIAGNOSIS — E119 Type 2 diabetes mellitus without complications: Secondary | ICD-10-CM | POA: Diagnosis not present

## 2015-10-23 DIAGNOSIS — E785 Hyperlipidemia, unspecified: Secondary | ICD-10-CM

## 2015-10-23 LAB — COMPREHENSIVE METABOLIC PANEL
ALBUMIN: 4 g/dL (ref 3.5–5.2)
ALK PHOS: 59 U/L (ref 39–117)
ALT: 17 U/L (ref 0–35)
AST: 13 U/L (ref 0–37)
BILIRUBIN TOTAL: 0.8 mg/dL (ref 0.2–1.2)
BUN: 17 mg/dL (ref 6–23)
CO2: 28 mEq/L (ref 19–32)
Calcium: 9.4 mg/dL (ref 8.4–10.5)
Chloride: 101 mEq/L (ref 96–112)
Creatinine, Ser: 0.69 mg/dL (ref 0.40–1.20)
GFR: 115.02 mL/min (ref >60.00)
GLUCOSE: 191 mg/dL — AB (ref 70–99)
POTASSIUM: 4 meq/L (ref 3.5–5.1)
SODIUM: 136 meq/L (ref 135–145)
TOTAL PROTEIN: 7.3 g/dL (ref 6.0–8.3)

## 2015-10-23 LAB — LIPID PANEL
CHOL/HDL RATIO: 5
Cholesterol: 224 mg/dL — ABNORMAL HIGH (ref 0–200)
HDL: 48.2 mg/dL (ref >39.00)
LDL Cholesterol: 153 mg/dL — ABNORMAL HIGH (ref 0–99)
NONHDL: 175.92
Triglycerides: 115 mg/dL (ref 0.0–149.0)
VLDL: 23 mg/dL (ref 0.0–40.0)

## 2015-10-23 LAB — HEMOGLOBIN A1C: HEMOGLOBIN A1C: 7.8 % — AB (ref 4.6–6.5)

## 2015-10-24 ENCOUNTER — Encounter: Payer: Self-pay | Admitting: *Deleted

## 2015-10-28 ENCOUNTER — Encounter: Payer: Self-pay | Admitting: Family Medicine

## 2015-10-28 ENCOUNTER — Ambulatory Visit (INDEPENDENT_AMBULATORY_CARE_PROVIDER_SITE_OTHER): Payer: Commercial Managed Care - HMO | Admitting: Family Medicine

## 2015-10-28 VITALS — BP 128/84 | HR 83 | Temp 97.9°F | Ht 64.0 in | Wt 243.8 lb

## 2015-10-28 DIAGNOSIS — I10 Essential (primary) hypertension: Secondary | ICD-10-CM

## 2015-10-28 DIAGNOSIS — E785 Hyperlipidemia, unspecified: Secondary | ICD-10-CM | POA: Diagnosis not present

## 2015-10-28 DIAGNOSIS — E119 Type 2 diabetes mellitus without complications: Secondary | ICD-10-CM | POA: Diagnosis not present

## 2015-10-28 NOTE — Assessment & Plan Note (Signed)
Disc goals for lipids and reasons to control them Rev labs with pt Rev low sat fat diet in detail Improving - she declines tx of any kind Will continue to follow

## 2015-10-28 NOTE — Progress Notes (Signed)
Subjective:    Patient ID: Krista Newton, female    DOB: 01/03/1966, 50 y.o.   MRN: [id number]  HPI  Here for f/u of chronic medical problems   Wt is down 4 lb with bmi of 41 Morbid obese     bp is improved today -last visit started hctz (declines ace)-doing ok with it  No cp or palpitations or headaches or edema  No side effects to medicines  BP Readings from Last 3 Encounters:  10/28/15 128/84  08/02/15 150/86  07/05/15 148/68       Chemistry      Component Value Date/Time   NA 136 10/23/2015 0853   K 4.0 10/23/2015 0853   CL 101 10/23/2015 0853   CO2 28 10/23/2015 0853   BUN 17 10/23/2015 0853   CREATININE 0.69 10/23/2015 0853      Component Value Date/Time   CALCIUM 9.4 10/23/2015 0853   ALKPHOS 59 10/23/2015 0853   AST 13 10/23/2015 0853   ALT 17 10/23/2015 0853   BILITOT 0.8 10/23/2015 0853       Diabetes Home sugar results - fluctuates a bit (no hypoglycemia) DM diet -eating a lot better - avoiding carbohydrates and less junk food  Exercise -is going to start walking  Symptoms-none  A1C last  Lab Results  Component Value Date   HGBA1C 7.8* 10/23/2015  this is down from 9.0   No problems with medications -glipizide xl  Renal protection-declines ace  Last eye exam -was last year-not a full year yet (she will check) -- got it in Oct    Hyperlipidemia Lab Results  Component Value Date   CHOL 224* 10/23/2015   CHOL 238* 07/05/2015   CHOL 204* 08/24/2014   Lab Results  Component Value Date   HDL 48.20 10/23/2015   HDL 55.10 07/05/2015   HDL 48.80 08/24/2014   Lab Results  Component Value Date   LDLCALC 153* 10/23/2015   LDLCALC 168* 07/05/2015   LDLCALC 130* 08/24/2014   Lab Results  Component Value Date   TRIG 115.0 10/23/2015   TRIG 74.0 07/05/2015   TRIG 127.0 08/24/2014   Lab Results  Component Value Date   CHOLHDL 5 10/23/2015   CHOLHDL 4 07/05/2015   CHOLHDL 4 08/24/2014   No results found for: LDLDIRECT   Eating a  healthy diet  Declines medication of any kind for this  Will keep working on it   Patient Active Problem List   Diagnosis Date Noted  . Morbid obesity ([place]) 07/07/2015  . Essential hypertension 07/05/2015  . Hyperlipidemia 08/31/2014  . Diabetes mellitus type 2, controlled, without complications ([place]) [id number]  . Leg cramps 02/06/2014  . Yeast vaginitis 02/06/2014  . Dry mouth 02/06/2014  . Thirst 02/06/2014  . Thrush 09/22/2013  . Shingles 09/18/2013  . Carpal tunnel syndrome 05/31/2013  . Routine general medical examination at a health care facility 07/04/2012  . HEARING LOSS, RIGHT EAR 07/26/2008  . ALLERGIC RHINITIS 06/28/2008   No past medical history on file. Past Surgical History  Procedure Laterality Date  . Cesarean section    . Tubal ligation    . Carpal tunnel release Right 04/24/14   Social History  Substance Use Topics  . Smoking status: Never Smoker   . Smokeless tobacco: Never Used  . Alcohol Use: No   Family History  Problem Relation Age of Onset  . Hypertension Mother   . Diabetes Mother   . Heart failure Mother   . Hypertension  Father   . Diabetes Sister   . Kidney failure Brother    No Known Allergies Current Outpatient Prescriptions on File Prior to Visit  Medication Sig Dispense Refill  . glipiZIDE (GLUCOTROL XL) 5 MG 24 hr tablet Take 1 tablet (5 mg total) by mouth daily with breakfast. 30 tablet 11  . glucose blood (ONE TOUCH ULTRA TEST) test strip TEST SUGAR TWICE A DAY AND AS NEEDED (dx. E11.9) 100 each 1  . hydrochlorothiazide (HYDRODIURIL) 25 MG tablet Take 1 tablet (25 mg total) by mouth daily. 30 tablet 11  . naproxen sodium (ALEVE) 220 MG tablet Take 440 mg by mouth 2 (two) times daily as needed. Reported on 07/05/2015     No current facility-administered medications on file prior to visit.    Review of Systems Review of Systems  Constitutional: Negative for fever, appetite change, fatigue and unexpected weight change.  Eyes:  Negative for pain and visual disturbance.  Respiratory: Negative for cough and shortness of breath.   Cardiovascular: Negative for cp or palpitations    Gastrointestinal: Negative for nausea, diarrhea and constipation.  Genitourinary: Negative for urgency and frequency.  Skin: Negative for pallor or rash   Neurological: Negative for weakness, light-headedness, numbness and headaches.  Hematological: Negative for adenopathy. Does not bruise/bleed easily.  Psychiatric/Behavioral: Negative for dysphoric mood. The patient is not nervous/anxious.         Objective:   Physical Exam  Constitutional: She appears well-developed and well-nourished. No distress.  obese and well appearing   HENT:  Head: Normocephalic and atraumatic.  Mouth/Throat: Oropharynx is clear and moist.  Eyes: Conjunctivae and EOM are normal. Pupils are equal, round, and reactive to light.  Neck: Normal range of motion. Neck supple. No JVD present. Carotid bruit is not present. No thyromegaly present.  Cardiovascular: Normal rate, regular rhythm, normal heart sounds and intact distal pulses.  Exam reveals no gallop.   Pulmonary/Chest: Effort normal and breath sounds normal. No respiratory distress. She has no wheezes. She has no rales.  No crackles  Abdominal: Soft. Bowel sounds are normal. She exhibits no distension, no abdominal bruit and no mass. There is no tenderness.  Musculoskeletal: She exhibits no edema.  Lymphadenopathy:    She has no cervical adenopathy.  Neurological: She is alert. She has normal reflexes.  Skin: Skin is warm and dry. No rash noted.  Psychiatric: She has a normal mood and affect.          Assessment & Plan:   Problem List Items Addressed This Visit      Cardiovascular and Mediastinum   Essential hypertension    bp in fair control at this time (improved with hctz) BP Readings from Last 1 Encounters:  10/28/15 128/84   No changes needed Disc lifstyle change with low sodium diet  and exercise   Labs reviewed       Relevant Orders   Comprehensive metabolic panel     Endocrine   Diabetes mellitus type 2, controlled, without complications ([place]) - Primary    Improvement Lab Results  Component Value Date   HGBA1C 7.8* 10/23/2015   Labs rev  opth utd Declines ace or arb Re check 6 mo  Urged to keep working on wt loss and low glycemic diet Continue glipizide       Relevant Orders   Hemoglobin A1c   Microalbumin / creatinine urine ratio     Other   Morbid obesity ([place])    Discussed how this problem influences overall  health and the risks it imposes  Reviewed plan for weight loss with lower calorie diet (via better food choices and also portion control or program like weight watchers) and exercise building up to or more than 30 minutes 5 days per week including some aerobic activity   Commended work so far       Hyperlipidemia    Disc goals for lipids and reasons to control them Rev labs with pt Rev low sat fat diet in detail Improving - she declines tx of any kind Will continue to follow      Relevant Orders   Lipid panel

## 2015-10-28 NOTE — Assessment & Plan Note (Signed)
Discussed how this problem influences overall health and the risks it imposes  Reviewed plan for weight loss with lower calorie diet (via better food choices and also portion control or program like weight watchers) and exercise building up to or more than 30 minutes 5 days per week including some aerobic activity   Commended work so far

## 2015-10-28 NOTE — Assessment & Plan Note (Signed)
bp in fair control at this time (improved with hctz) BP Readings from Last 1 Encounters:  10/28/15 128/84   No changes needed Disc lifstyle change with low sodium diet and exercise   Labs reviewed

## 2015-10-28 NOTE — Patient Instructions (Signed)
I'm pleased with improvement in sugar and cholesterol and blood pressure control  Follow up in 6 months with labs prior Take care of yourself  Think about a plan for exercise - 5 days per week (does not have to be the same thing every day)- park farther away and use stairs when you can  For cholesterol    Avoid red meat/ fried foods/ egg yolks/ fatty breakfast meats/ butter, cheese and high fat dairy/ and shellfish

## 2015-10-28 NOTE — Assessment & Plan Note (Signed)
Improvement Lab Results  Component Value Date   HGBA1C 7.8* 10/23/2015   Labs rev  opth utd Declines ace or arb Re check 6 mo  Urged to keep working on wt loss and low glycemic diet Continue glipizide

## 2015-10-28 NOTE — Progress Notes (Signed)
Pre visit review using our clinic review tool, if applicable. No additional management support is needed unless otherwise documented below in the visit note. 

## 2016-01-03 ENCOUNTER — Ambulatory Visit: Payer: Self-pay | Admitting: Family Medicine

## 2016-01-08 ENCOUNTER — Telehealth: Payer: Self-pay

## 2016-01-08 NOTE — Telephone Encounter (Signed)
That is fine with me -please make a nurse appointment

## 2016-01-08 NOTE — Telephone Encounter (Signed)
appt scheduled

## 2016-01-08 NOTE — Telephone Encounter (Signed)
Pt left v/m; going to nurse aide class and wants to know if could start Hep B shot series. Pt request cb.

## 2016-01-09 ENCOUNTER — Ambulatory Visit (INDEPENDENT_AMBULATORY_CARE_PROVIDER_SITE_OTHER): Payer: Commercial Managed Care - HMO | Admitting: *Deleted

## 2016-01-09 DIAGNOSIS — Z23 Encounter for immunization: Secondary | ICD-10-CM

## 2016-01-09 DIAGNOSIS — Z0184 Encounter for antibody response examination: Secondary | ICD-10-CM

## 2016-01-17 ENCOUNTER — Telehealth: Payer: Self-pay | Admitting: Family Medicine

## 2016-01-17 DIAGNOSIS — Z209 Contact with and (suspected) exposure to unspecified communicable disease: Secondary | ICD-10-CM

## 2016-01-17 NOTE — Telephone Encounter (Signed)
Pt called she has ? About her tb test she had done in jan Please call pt

## 2016-01-17 NOTE — Telephone Encounter (Signed)
Pt is in school and needs titers labs and a TB gold labs done for school. Pt is coming in on 01/22/16 for a nurse visit to get her tdap and she would like the labs done then. Lab appt scheduled and pt said she needs MMR, Varicella, and Hep B titers and then she needs the blood work to see if she has TB, please put orders in [facility]

## 2016-01-19 DIAGNOSIS — Z209 Contact with and (suspected) exposure to unspecified communicable disease: Secondary | ICD-10-CM | POA: Insufficient documentation

## 2016-01-19 NOTE — Telephone Encounter (Signed)
Order done

## 2016-01-22 ENCOUNTER — Other Ambulatory Visit (INDEPENDENT_AMBULATORY_CARE_PROVIDER_SITE_OTHER): Payer: Commercial Managed Care - HMO

## 2016-01-22 ENCOUNTER — Ambulatory Visit (INDEPENDENT_AMBULATORY_CARE_PROVIDER_SITE_OTHER): Payer: Commercial Managed Care - HMO

## 2016-01-22 DIAGNOSIS — Z209 Contact with and (suspected) exposure to unspecified communicable disease: Secondary | ICD-10-CM | POA: Diagnosis not present

## 2016-01-22 DIAGNOSIS — Z23 Encounter for immunization: Secondary | ICD-10-CM | POA: Diagnosis not present

## 2016-01-23 LAB — VARICELLA ZOSTER ANTIBODY, IGG: VARICELLA IGG: 3266 {index} — AB (ref <135.00)

## 2016-01-23 LAB — MEASLES/MUMPS/RUBELLA IMMUNITY
Mumps IgG: 21.7 AU/mL — ABNORMAL HIGH (ref <9.00)
RUBELLA: 22.3 {index} — AB (ref <0.90)
RUBEOLA IGG: 139 [AU]/ml — AB (ref <25.00)

## 2016-01-23 LAB — HEPATITIS B SURFACE ANTIBODY, QUANTITATIVE: Hepatitis B-Post: <5 m[IU]/mL

## 2016-01-25 LAB — QUANTIFERON TB GOLD ASSAY (BLOOD)
Interferon Gamma Release Assay: NEGATIVE
QUANTIFERON TB AG MINUS NIL: 0.01 [IU]/mL
Quantiferon Nil Value: 0.02 IU/mL

## 2016-01-27 ENCOUNTER — Encounter: Payer: Self-pay | Admitting: *Deleted

## 2016-02-11 ENCOUNTER — Ambulatory Visit (INDEPENDENT_AMBULATORY_CARE_PROVIDER_SITE_OTHER): Payer: Commercial Managed Care - HMO | Admitting: *Deleted

## 2016-02-11 DIAGNOSIS — Z23 Encounter for immunization: Secondary | ICD-10-CM

## 2016-02-12 LAB — HM DIABETES EYE EXAM

## 2016-02-17 ENCOUNTER — Other Ambulatory Visit (INDEPENDENT_AMBULATORY_CARE_PROVIDER_SITE_OTHER): Payer: Commercial Managed Care - HMO

## 2016-02-17 DIAGNOSIS — Z23 Encounter for immunization: Secondary | ICD-10-CM

## 2016-04-24 ENCOUNTER — Other Ambulatory Visit (INDEPENDENT_AMBULATORY_CARE_PROVIDER_SITE_OTHER): Payer: Commercial Managed Care - HMO

## 2016-04-24 DIAGNOSIS — I10 Essential (primary) hypertension: Secondary | ICD-10-CM | POA: Diagnosis not present

## 2016-04-24 DIAGNOSIS — E785 Hyperlipidemia, unspecified: Secondary | ICD-10-CM | POA: Diagnosis not present

## 2016-04-24 DIAGNOSIS — E119 Type 2 diabetes mellitus without complications: Secondary | ICD-10-CM | POA: Diagnosis not present

## 2016-04-24 LAB — LIPID PANEL
CHOL/HDL RATIO: 5
Cholesterol: 216 mg/dL — ABNORMAL HIGH (ref 0–200)
HDL: 47.8 mg/dL (ref >39.00)
LDL Cholesterol: 151 mg/dL — ABNORMAL HIGH (ref 0–99)
NONHDL: 168.01
TRIGLYCERIDES: 86 mg/dL (ref 0.0–149.0)
VLDL: 17.2 mg/dL (ref 0.0–40.0)

## 2016-04-24 LAB — COMPREHENSIVE METABOLIC PANEL
ALT: 14 U/L (ref 0–35)
AST: 13 U/L (ref 0–37)
Albumin: 3.8 g/dL (ref 3.5–5.2)
Alkaline Phosphatase: 60 U/L (ref 39–117)
BUN: 16 mg/dL (ref 6–23)
CHLORIDE: 103 meq/L (ref 96–112)
CO2: 28 meq/L (ref 19–32)
Calcium: 9.1 mg/dL (ref 8.4–10.5)
Creatinine, Ser: 0.66 mg/dL (ref 0.40–1.20)
GFR: 120.84 mL/min (ref >60.00)
GLUCOSE: 186 mg/dL — AB (ref 70–99)
POTASSIUM: 3.7 meq/L (ref 3.5–5.1)
SODIUM: 138 meq/L (ref 135–145)
TOTAL PROTEIN: 7 g/dL (ref 6.0–8.3)
Total Bilirubin: 0.9 mg/dL (ref 0.2–1.2)

## 2016-04-24 LAB — MICROALBUMIN / CREATININE URINE RATIO
Creatinine,U: 117 mg/dL
MICROALB/CREAT RATIO: 0.6 mg/g (ref 0.0–30.0)

## 2016-04-24 LAB — HEMOGLOBIN A1C: Hgb A1c MFr Bld: 7.3 % — ABNORMAL HIGH (ref 4.6–6.5)

## 2016-04-29 ENCOUNTER — Ambulatory Visit (INDEPENDENT_AMBULATORY_CARE_PROVIDER_SITE_OTHER): Payer: Commercial Managed Care - HMO | Admitting: Family Medicine

## 2016-04-29 ENCOUNTER — Encounter: Payer: Self-pay | Admitting: Family Medicine

## 2016-04-29 VITALS — BP 134/80 | HR 74 | Temp 98.3°F | Ht 64.0 in | Wt 245.5 lb

## 2016-04-29 DIAGNOSIS — I10 Essential (primary) hypertension: Secondary | ICD-10-CM

## 2016-04-29 DIAGNOSIS — E7800 Pure hypercholesterolemia, unspecified: Secondary | ICD-10-CM | POA: Diagnosis not present

## 2016-04-29 DIAGNOSIS — E119 Type 2 diabetes mellitus without complications: Secondary | ICD-10-CM

## 2016-04-29 NOTE — Progress Notes (Signed)
Pre visit review using our clinic review tool, if applicable. No additional management support is needed unless otherwise documented below in the visit note. 

## 2016-04-29 NOTE — Assessment & Plan Note (Signed)
bp in fair control at this time  BP Readings from Last 1 Encounters:  04/29/16 134/80   No changes needed Disc lifstyle change with low sodium diet and exercise  Labs reviewed

## 2016-04-29 NOTE — Progress Notes (Signed)
Subjective:    Patient ID: Krista Newton, female    DOB: 01-06-1966, 50 y.o.   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  Then should have more time for self care   Wt Readings from Last 3 Encounters:  04/29/16 245 lb 8 oz (111.4 kg)  10/28/15 243 lb 12 oz (110.6 kg)  08/02/15 247 lb 8 oz (112.3 kg)   bmi is 42.1-morbid obese category  bp is stable today  No cp or palpitations or headaches or edema  No side effects to medicines  BP Readings from Last 3 Encounters:  04/29/16 134/80  10/28/15 128/84  08/02/15 (!) 150/86      Diabetes Home sugar results -no low levels at all  Levels go up and down - in the am sometimes higher  130s lowest  DM diet -trying to be better , avoids junk food (weakness is potato and bread) Eating some nuts and that works out better  More fruit/veg and trying not to skip meals  Also drinking more water    (less soda and less tea)  She has done DM teaching in the past  Exercise - will have more time for that later  Symptoms-sometimes thirsty and the hctz makes her urinate more  A1C last  Lab Results  Component Value Date   HGBA1C 7.3 (H) 04/24/2016   This is improved from 7.8 Has declined ace or arb  Last eye exam  917 Taking glipizide 5 xl- she would like to keep it the same and work on diet  Prefers not to take metformin     Hx of hyperlipidemia Lab Results  Component Value Date   CHOL 216 (H) 04/24/2016   CHOL 224 (H) 10/23/2015   CHOL 238 (H) 07/05/2015   Lab Results  Component Value Date   HDL 47.80 04/24/2016   HDL 48.20 10/23/2015   HDL 55.10 07/05/2015   Lab Results  Component Value Date   LDLCALC 151 (H) 04/24/2016   LDLCALC 153 (H) 10/23/2015   LDLCALC 168 (H) 07/05/2015   Lab Results  Component Value Date   TRIG 86.0 04/24/2016   TRIG 115.0 10/23/2015   TRIG 74.0 07/05/2015   Lab Results  Component Value Date   CHOLHDL 5 04/24/2016     CHOLHDL 5 10/23/2015   CHOLHDL 4 07/05/2015   No results found for: LDLDIRECT She had declined medicine of any kind for cholesterol  She does eat fried potato  Not a lot of fried chicken  No ice cream  Some cheese and butter (cooks with it)  And bacon  Would consider trying [place] sausage   Lab Results  Component Value Date   MICROALBUR <0.7 04/24/2016     Patient Active Problem List   Diagnosis Date Noted  . Exposure to communicable disease 01/19/2016  . Morbid obesity ([place]) 07/07/2015  . Essential hypertension 07/05/2015  . Hyperlipidemia 08/31/2014  . Diabetes mellitus type 2, controlled, without complications ([place]) [id number]  . Leg cramps 02/06/2014  . Dry mouth 02/06/2014  . Thirst 02/06/2014  . History of shingles 09/18/2013  . Carpal tunnel syndrome 05/31/2013  . Routine general medical examination at a health care facility 07/04/2012  . HEARING LOSS, RIGHT EAR 07/26/2008  . ALLERGIC RHINITIS 06/28/2008   No past medical history on file. Past Surgical History:  Procedure Laterality Date  . CARPAL TUNNEL RELEASE Right 04/24/14  . CESAREAN SECTION    .  TUBAL LIGATION     Social History  Substance Use Topics  . Smoking status: Never Smoker  . Smokeless tobacco: Never Used  . Alcohol use No   Family History  Problem Relation Age of Onset  . Hypertension Mother   . Diabetes Mother   . Heart failure Mother   . Hypertension Father   . Diabetes Sister   . Kidney failure Brother    No Known Allergies Current Outpatient Prescriptions on File Prior to Visit  Medication Sig Dispense Refill  . glipiZIDE (GLUCOTROL XL) 5 MG 24 hr tablet Take 1 tablet (5 mg total) by mouth daily with breakfast. 30 tablet 11  . glucose blood (ONE TOUCH ULTRA TEST) test strip TEST SUGAR TWICE A DAY AND AS NEEDED (dx. E11.9) 100 each 1  . hydrochlorothiazide (HYDRODIURIL) 25 MG tablet Take 1 tablet (25 mg total) by mouth daily. 30 tablet 11  . naproxen sodium (ALEVE) 220 MG tablet  Take 440 mg by mouth 2 (two) times daily as needed. Reported on 07/05/2015     No current facility-administered medications on file prior to visit.     Review of Systems Review of Systems  Constitutional: Negative for fever, appetite change,  and unexpected weight change. pos for fatigue from hectic schedule  Eyes: Negative for pain and visual disturbance.  Respiratory: Negative for cough and shortness of breath.   Cardiovascular: Negative for cp or palpitations    Gastrointestinal: Negative for nausea, diarrhea and constipation.  Genitourinary: Negative for urgency and frequency. pos for thirst  Skin: Negative for pallor or rash   Neurological: Negative for weakness, light-headedness, numbness and headaches. neg for neuropathy symptoms  Hematological: Negative for adenopathy. Does not bruise/bleed easily.  Psychiatric/Behavioral: Negative for dysphoric mood. The patient is not nervous/anxious.         Objective:   Physical Exam  Constitutional: She appears well-developed and well-nourished. No distress.  Morbidly obese and well appearing   HENT:  Head: Normocephalic and atraumatic.  Mouth/Throat: Oropharynx is clear and moist.  Eyes: Conjunctivae and EOM are normal. Pupils are equal, round, and reactive to light.  Neck: Normal range of motion. Neck supple. No JVD present. Carotid bruit is not present. No thyromegaly present.  Cardiovascular: Normal rate, regular rhythm, normal heart sounds and intact distal pulses.  Exam reveals no gallop.   Pulmonary/Chest: Effort normal and breath sounds normal. No respiratory distress. She has no wheezes. She has no rales.  No crackles  Abdominal: Soft. Bowel sounds are normal. She exhibits no distension, no abdominal bruit and no mass. There is no tenderness.  Musculoskeletal: She exhibits no edema.  Lymphadenopathy:    She has no cervical adenopathy.  Neurological: She is alert. She has normal reflexes.  Skin: Skin is warm and dry. No rash  noted.  Psychiatric: She has a normal mood and affect.          Assessment & Plan:   Problem List Items Addressed This Visit      Cardiovascular and Mediastinum   Essential hypertension - Primary    bp in fair control at this time  BP Readings from Last 1 Encounters:  04/29/16 134/80   No changes needed Disc lifstyle change with low sodium diet and exercise  Labs reviewed         Endocrine   Diabetes mellitus type 2, controlled, without complications ([place])    Lab Results  Component Value Date   HGBA1C 7.3 (H) 04/24/2016   This is improved from  7.8 with diet effort and glipizide xl 5  She does not want to inc dose - chooses to work on diet and eventually exercise (when school is done)  Rev diet and suggestions made- she has had DM ed utd opthy microalb is also nl and utd (declines ace or arb) F/u 6 mo         Other   Morbid obesity ([place])    Discussed how this problem influences overall health and the risks it imposes  Reviewed plan for weight loss with lower calorie diet (via better food choices and also portion control or program like weight watchers) and exercise building up to or more than 30 minutes 5 days per week including some aerobic activity   Working on diet for chol and DM also  She is finishing school and soon may have time to start an exercise program- encouraged this       Hyperlipidemia    Disc goals for lipids and reasons to control them Rev labs with pt Rev low sat fat diet in detail About the same LDL in 150s Declines med of any kind Rev diet  Will cut out fried food (fried potato and also bacon)  Also reduce cheese Continue to monitor F/u 6 mo

## 2016-04-29 NOTE — Assessment & Plan Note (Signed)
Lab Results  Component Value Date   HGBA1C 7.3 (H) 04/24/2016   This is improved from 7.8 with diet effort and glipizide xl 5  She does not want to inc dose - chooses to work on diet and eventually exercise (when school is done)  Rev diet and suggestions made- she has had DM ed utd opthy microalb is also nl and utd (declines ace or arb) F/u 6 mo

## 2016-04-29 NOTE — Assessment & Plan Note (Signed)
Disc goals for lipids and reasons to control them Rev labs with pt Rev low sat fat diet in detail About the same LDL in 150s Declines med of any kind Rev diet  Will cut out fried food (fried potato and also bacon)  Also reduce cheese Continue to monitor F/u 6 mo

## 2016-04-29 NOTE — Patient Instructions (Addendum)
For cholesterol   : Avoid red meat/ fried foods/ egg yolks/ fatty breakfast meats/ butter, cheese and high fat dairy/ and shellfish  When you can exercise -that will make a difference also   Keep working on decreasing carbs and increasing protein   Follow up in 6 months for visit with labs prior   Call your insurance company and find out what brand of glucose meter they cover (and what strips and lancets )- be as specific as possible and let us know so I can px it

## 2016-04-29 NOTE — Assessment & Plan Note (Signed)
Discussed how this problem influences overall health and the risks it imposes  Reviewed plan for weight loss with lower calorie diet (via better food choices and also portion control or program like weight watchers) and exercise building up to or more than 30 minutes 5 days per week including some aerobic activity   Working on diet for chol and DM also  She is finishing school and soon may have time to start an exercise program- encouraged this

## 2016-06-23 DIAGNOSIS — M25562 Pain in left knee: Secondary | ICD-10-CM | POA: Diagnosis not present

## 2016-06-26 DIAGNOSIS — Z79899 Other long term (current) drug therapy: Secondary | ICD-10-CM | POA: Diagnosis not present

## 2016-06-26 DIAGNOSIS — R5383 Other fatigue: Secondary | ICD-10-CM | POA: Diagnosis not present

## 2016-06-26 DIAGNOSIS — R0602 Shortness of breath: Secondary | ICD-10-CM | POA: Diagnosis not present

## 2016-07-14 ENCOUNTER — Encounter (INDEPENDENT_AMBULATORY_CARE_PROVIDER_SITE_OTHER): Payer: Self-pay

## 2016-07-14 ENCOUNTER — Ambulatory Visit (INDEPENDENT_AMBULATORY_CARE_PROVIDER_SITE_OTHER): Payer: Commercial Managed Care - HMO | Admitting: *Deleted

## 2016-07-14 DIAGNOSIS — Z23 Encounter for immunization: Secondary | ICD-10-CM

## 2016-08-14 ENCOUNTER — Other Ambulatory Visit: Payer: Self-pay | Admitting: Family Medicine

## 2016-10-02 ENCOUNTER — Telehealth: Payer: Self-pay

## 2016-10-02 NOTE — Telephone Encounter (Signed)
At the 04/29/16 office visit pt was to ck with ins co about what diabetic meter,strips and lancets were approved. Pt left v/m needs rx for onetouch verio meter, verio strips and delica lancets. [facility].

## 2016-10-02 NOTE — Telephone Encounter (Signed)
Order hand written and in IN box  Thanks

## 2016-10-02 NOTE — Telephone Encounter (Signed)
I have faxed to rx to [place]

## 2016-10-26 ENCOUNTER — Other Ambulatory Visit: Payer: Commercial Managed Care - HMO

## 2016-10-28 ENCOUNTER — Ambulatory Visit: Payer: Commercial Managed Care - HMO | Admitting: Family Medicine

## 2016-11-20 ENCOUNTER — Other Ambulatory Visit: Payer: Commercial Managed Care - HMO

## 2016-11-24 ENCOUNTER — Ambulatory Visit: Payer: 59 | Admitting: Family Medicine

## 2016-12-04 ENCOUNTER — Other Ambulatory Visit: Payer: 59

## 2016-12-08 ENCOUNTER — Ambulatory Visit: Payer: 59 | Admitting: Family Medicine

## 2016-12-11 ENCOUNTER — Other Ambulatory Visit (INDEPENDENT_AMBULATORY_CARE_PROVIDER_SITE_OTHER): Payer: 59

## 2016-12-11 DIAGNOSIS — E7800 Pure hypercholesterolemia, unspecified: Secondary | ICD-10-CM | POA: Diagnosis not present

## 2016-12-11 DIAGNOSIS — Z0184 Encounter for antibody response examination: Secondary | ICD-10-CM | POA: Diagnosis not present

## 2016-12-11 DIAGNOSIS — E119 Type 2 diabetes mellitus without complications: Secondary | ICD-10-CM

## 2016-12-11 DIAGNOSIS — I10 Essential (primary) hypertension: Secondary | ICD-10-CM

## 2016-12-11 LAB — HEMOGLOBIN A1C: HEMOGLOBIN A1C: 7.6 % — AB (ref 4.6–6.5)

## 2016-12-11 LAB — CBC WITH DIFFERENTIAL/PLATELET
BASOS ABS: 0.1 10*3/uL (ref 0.0–0.1)
Basophils Relative: 1 % (ref 0.0–3.0)
Eosinophils Absolute: 0.2 10*3/uL (ref 0.0–0.7)
Eosinophils Relative: 2.4 % (ref 0.0–5.0)
HCT: 38.1 % (ref 36.0–46.0)
Hemoglobin: 12.9 g/dL (ref 12.0–15.0)
LYMPHS ABS: 2.1 10*3/uL (ref 0.7–4.0)
Lymphocytes Relative: 32 % (ref 12.0–46.0)
MCHC: 33.8 g/dL (ref 30.0–36.0)
MCV: 90.3 fl (ref 78.0–100.0)
MONO ABS: 0.4 10*3/uL (ref 0.1–1.0)
MONOS PCT: 6.8 % (ref 3.0–12.0)
NEUTROS PCT: 57.8 % (ref 43.0–77.0)
Neutro Abs: 3.8 10*3/uL (ref 1.4–7.7)
Platelets: 258 10*3/uL (ref 150.0–400.0)
RBC: 4.22 Mil/uL (ref 3.87–5.11)
RDW: 13.4 % (ref 11.5–15.5)
WBC: 6.5 10*3/uL (ref 4.0–10.5)

## 2016-12-11 LAB — LIPID PANEL
Cholesterol: 222 mg/dL — ABNORMAL HIGH (ref 0–200)
HDL: 52.5 mg/dL (ref >39.00)
LDL Cholesterol: 146 mg/dL — ABNORMAL HIGH (ref 0–99)
NONHDL: 169.07
Total CHOL/HDL Ratio: 4
Triglycerides: 115 mg/dL (ref 0.0–149.0)
VLDL: 23 mg/dL (ref 0.0–40.0)

## 2016-12-11 LAB — COMPREHENSIVE METABOLIC PANEL
ALK PHOS: 57 U/L (ref 39–117)
ALT: 15 U/L (ref 0–35)
AST: 15 U/L (ref 0–37)
Albumin: 4.1 g/dL (ref 3.5–5.2)
BILIRUBIN TOTAL: 0.5 mg/dL (ref 0.2–1.2)
BUN: 19 mg/dL (ref 6–23)
CO2: 27 mEq/L (ref 19–32)
Calcium: 9.6 mg/dL (ref 8.4–10.5)
Chloride: 102 mEq/L (ref 96–112)
Creatinine, Ser: 0.71 mg/dL (ref 0.40–1.20)
GFR: 110.8 mL/min (ref >60.00)
GLUCOSE: 164 mg/dL — AB (ref 70–99)
Potassium: 3.8 mEq/L (ref 3.5–5.1)
SODIUM: 137 meq/L (ref 135–145)
TOTAL PROTEIN: 7.4 g/dL (ref 6.0–8.3)

## 2016-12-11 LAB — TSH: TSH: 2.12 u[IU]/mL (ref 0.35–4.50)

## 2016-12-12 LAB — HEPATITIS B SURFACE ANTIBODY, QUANTITATIVE: Hepatitis B-Post: 1000 m[IU]/mL (ref >=10)

## 2016-12-14 ENCOUNTER — Ambulatory Visit: Payer: 59 | Admitting: Family Medicine

## 2016-12-21 ENCOUNTER — Ambulatory Visit (INDEPENDENT_AMBULATORY_CARE_PROVIDER_SITE_OTHER): Payer: 59 | Admitting: Family Medicine

## 2016-12-21 ENCOUNTER — Encounter: Payer: Self-pay | Admitting: Family Medicine

## 2016-12-21 VITALS — BP 118/76 | HR 86 | Temp 98.6°F | Resp 16 | Ht 64.0 in | Wt 242.0 lb

## 2016-12-21 DIAGNOSIS — E7800 Pure hypercholesterolemia, unspecified: Secondary | ICD-10-CM | POA: Diagnosis not present

## 2016-12-21 DIAGNOSIS — R252 Cramp and spasm: Secondary | ICD-10-CM | POA: Diagnosis not present

## 2016-12-21 DIAGNOSIS — I10 Essential (primary) hypertension: Secondary | ICD-10-CM

## 2016-12-21 DIAGNOSIS — E119 Type 2 diabetes mellitus without complications: Secondary | ICD-10-CM

## 2016-12-21 MED ORDER — GLIPIZIDE ER 5 MG PO TB24: 5.0000 mg | ORAL_TABLET | Freq: Every day | ORAL | 11 refills | Status: DC

## 2016-12-21 MED ORDER — HYDROCHLOROTHIAZIDE 25 MG PO TABS: 25.0000 mg | ORAL_TABLET | ORAL | 11 refills | Status: DC

## 2016-12-21 NOTE — Assessment & Plan Note (Signed)
Disc goals for lipids and reasons to control them Rev labs with pt Rev low sat fat diet in detail HDL is up and LDL down slt Declines medication of any kind for this  Rev diet

## 2016-12-21 NOTE — Assessment & Plan Note (Signed)
Discussed how this problem influences overall health and the risks it imposes  Reviewed plan for weight loss with lower calorie diet (via better food choices and also portion control or program like weight watchers) and exercise building up to or more than 30 minutes 5 days per week including some aerobic activity   Commended on wt loss so far  Enc to keep working at it  Plans to fit in more walking and cut out more refined carbs

## 2016-12-21 NOTE — Progress Notes (Signed)
Subjective:    Patient ID: Krista Newton, female    DOB: 03-03-1966, 51 y.o.   MRN: [id number]  HPI Here for f/u of chronic health problems   Wt Readings from Last 3 Encounters:  12/21/16 242 lb (109.8 kg)  04/29/16 245 lb 8 oz (111.4 kg)  10/28/15 243 lb 12 oz (110.6 kg)  trying to work on weight loss - trying to change eating habits  More walking at her job as well  Gradually making changes  41.54 kg/m   bp is stable today  No cp or palpitations or headaches or edema  No side effects to medicines  BP Readings from Last 3 Encounters:  12/21/16 118/76  04/29/16 134/80  10/28/15 128/84       Chemistry      Component Value Date/Time   NA 137 12/11/2016 1040   K 3.8 12/11/2016 1040   CL 102 12/11/2016 1040   CO2 27 12/11/2016 1040   BUN 19 12/11/2016 1040   CREATININE 0.71 12/11/2016 1040      Component Value Date/Time   CALCIUM 9.6 12/11/2016 1040   ALKPHOS 57 12/11/2016 1040   AST 15 12/11/2016 1040   ALT 15 12/11/2016 1040   BILITOT 0.5 12/11/2016 1040     glucose was 164    Diabetes Home sugar results - 120s this am / higher some days  No low glucose levels   DM diet - gets protein with every meal- pb and chicken  Eating on the go is the hardest thing / needs to prep more herself Exercise -more walking at work / wants to add more / would like to walk more outside   Symptoms- none  A1C last  Lab Results  Component Value Date   HGBA1C 7.6 (H) 12/11/2016  this is up from 7.3 She does not want to inc her glipizide  Making progress Weakness - is bread/has to be careful   No problems with medications  Lab Results  Component Value Date   MICROALBUR <0.7 04/24/2016    Last eye exam  Nl 9/17  Hyperlipidemia Lab Results  Component Value Date   CHOL 222 (H) 12/11/2016   CHOL 216 (H) 04/24/2016   CHOL 224 (H) 10/23/2015   Lab Results  Component Value Date   HDL 52.50 12/11/2016   HDL 47.80 04/24/2016   HDL 48.20 10/23/2015   Lab Results    Component Value Date   LDLCALC 146 (H) 12/11/2016   LDLCALC 151 (H) 04/24/2016   LDLCALC 153 (H) 10/23/2015   Lab Results  Component Value Date   TRIG 115.0 12/11/2016   TRIG 86.0 04/24/2016   TRIG 115.0 10/23/2015   Lab Results  Component Value Date   CHOLHDL 4 12/11/2016   CHOLHDL 5 04/24/2016   CHOLHDL 5 10/23/2015   No results found for: LDLDIRECT  Declines all medicine for cholesterol   Patient Active Problem List   Diagnosis Date Noted  . Exposure to communicable disease 01/19/2016  . Morbid obesity ([place]) 07/07/2015  . Essential hypertension 07/05/2015  . Hyperlipidemia 08/31/2014  . Diabetes mellitus type 2, controlled, without complications ([place]) 51/76/1607  . Leg cramps 02/06/2014  . Dry mouth 02/06/2014  . History of shingles 09/18/2013  . Carpal tunnel syndrome 05/31/2013  . Routine general medical examination at a health care facility 07/04/2012  . HEARING LOSS, RIGHT EAR 07/26/2008  . ALLERGIC RHINITIS 06/28/2008   History reviewed. No pertinent past medical history. Past Surgical History:  Procedure Laterality Date  .  CARPAL TUNNEL RELEASE Right 04/24/14  . CESAREAN SECTION    . TUBAL LIGATION     Social History  Substance Use Topics  . Smoking status: Never Smoker  . Smokeless tobacco: Never Used  . Alcohol use No   Family History  Problem Relation Age of Onset  . Hypertension Mother   . Diabetes Mother   . Heart failure Mother   . Hypertension Father   . Diabetes Sister   . Kidney failure Brother    No Known Allergies Current Outpatient Prescriptions on File Prior to Visit  Medication Sig Dispense Refill  . glucose blood (ONE TOUCH ULTRA TEST) test strip TEST SUGAR TWICE A DAY AND AS NEEDED (dx. E11.9) 100 each 1  . naproxen sodium (ALEVE) 220 MG tablet Take 440 mg by mouth 2 (two) times daily as needed. Reported on 07/05/2015     No current facility-administered medications on file prior to visit.     Review of Systems Review of  Systems  Constitutional: Negative for fever, appetite change, fatigue and unexpected weight change.  Eyes: Negative for pain and visual disturbance.  Respiratory: Negative for cough and shortness of breath.   Cardiovascular: Negative for cp or palpitations    Gastrointestinal: Negative for nausea, diarrhea and constipation.  Genitourinary: Negative for urgency and frequency.  Skin: Negative for pallor or rash   Neurological: Negative for weakness, light-headedness, numbness and headaches.  Hematological: Negative for adenopathy. Does not bruise/bleed easily.  Psychiatric/Behavioral: Negative for dysphoric mood. The patient is not nervous/anxious.  pos for busy lifestyle with less time for self care        Objective:   Physical Exam  Constitutional: She appears well-developed and well-nourished. No distress.  obese and well appearing   HENT:  Head: Normocephalic and atraumatic.  Mouth/Throat: Oropharynx is clear and moist.  Eyes: Pupils are equal, round, and reactive to light. Conjunctivae and EOM are normal.  Neck: Normal range of motion. Neck supple. No JVD present. Carotid bruit is not present. No thyromegaly present.  Cardiovascular: Normal rate, regular rhythm, normal heart sounds and intact distal pulses.  Exam reveals no gallop.   Pulmonary/Chest: Effort normal and breath sounds normal. No respiratory distress. She has no wheezes. She has no rales.  No crackles  Abdominal: Soft. Bowel sounds are normal. She exhibits no distension, no abdominal bruit and no mass. There is no tenderness.  Musculoskeletal: She exhibits no edema.  Lymphadenopathy:    She has no cervical adenopathy.  Neurological: She is alert. She has normal reflexes.  Skin: Skin is warm and dry. No rash noted. No pallor.  Psychiatric: She has a normal mood and affect.          Assessment & Plan:   Problem List Items Addressed This Visit      Cardiovascular and Mediastinum   Essential hypertension -  Primary    bp in fair control at this time  BP Readings from Last 1 Encounters:  12/21/16 118/76   No changes needed Disc lifstyle change with low sodium diet and exercise  Pt takes hctz every other day or she gets leg cramps Labs reviewed        Relevant Medications   hydrochlorothiazide (HYDRODIURIL) 25 MG tablet     Endocrine   Diabetes mellitus type 2, controlled, without complications ([place])    Lab Results  Component Value Date   HGBA1C 7.6 (H) 12/11/2016   This is up  She wants to work harder on diet/exercise/wt loss before  inc her glipizide  utd eye and foot care Does not want to take metformin  F/u 3 mo with lab prior       Relevant Medications   glipiZIDE (GLUCOTROL XL) 5 MG 24 hr tablet     Other   Hyperlipidemia    Disc goals for lipids and reasons to control them Rev labs with pt Rev low sat fat diet in detail HDL is up and LDL down slt Declines medication of any kind for this  Rev diet       Relevant Medications   hydrochlorothiazide (HYDRODIURIL) 25 MG tablet   Leg cramps    Improved after cutting hctz to every other day  bp is controlled  Labs rev       Morbid obesity ([place])    Discussed how this problem influences overall health and the risks it imposes  Reviewed plan for weight loss with lower calorie diet (via better food choices and also portion control or program like weight watchers) and exercise building up to or more than 30 minutes 5 days per week including some aerobic activity   Commended on wt loss so far  Enc to keep working at it  Plans to fit in more walking and cut out more refined carbs       Relevant Medications   glipiZIDE (GLUCOTROL XL) 5 MG 24 hr tablet

## 2016-12-21 NOTE — Assessment & Plan Note (Signed)
Lab Results  Component Value Date   HGBA1C 7.6 (H) 12/11/2016   This is up  She wants to work harder on diet/exercise/wt loss before inc her glipizide  utd eye and foot care Does not want to take metformin  F/u 3 mo with lab prior

## 2016-12-21 NOTE — Assessment & Plan Note (Addendum)
bp in fair control at this time  BP Readings from Last 1 Encounters:  12/21/16 118/76   No changes needed Disc lifstyle change with low sodium diet and exercise  Pt takes hctz every other day or she gets leg cramps Labs reviewed

## 2016-12-21 NOTE — Assessment & Plan Note (Signed)
Improved after cutting hctz to every other day  bp is controlled  Labs rev

## 2016-12-21 NOTE — Patient Instructions (Addendum)
Try to get most of your carbs from the produce section and less bread/pasta/snack foods/cereal/rice   Sweet potatoes are better than white ones Keep walking   For cholesterol  Avoid red meat/ fried foods/ egg yolks/ fatty breakfast meats/ butter, cheese and high fat dairy/ and shellfish    Follow up in 3 months with lab prior

## 2017-03-05 DIAGNOSIS — H35033 Hypertensive retinopathy, bilateral: Secondary | ICD-10-CM | POA: Diagnosis not present

## 2017-03-05 DIAGNOSIS — I708 Atherosclerosis of other arteries: Secondary | ICD-10-CM | POA: Diagnosis not present

## 2017-03-05 DIAGNOSIS — H2513 Age-related nuclear cataract, bilateral: Secondary | ICD-10-CM | POA: Diagnosis not present

## 2017-03-05 LAB — HM DIABETES EYE EXAM

## 2017-03-11 ENCOUNTER — Ambulatory Visit: Payer: 59

## 2017-04-19 ENCOUNTER — Other Ambulatory Visit: Payer: 59

## 2017-04-20 ENCOUNTER — Ambulatory Visit: Payer: 59 | Admitting: Family Medicine

## 2017-04-23 ENCOUNTER — Ambulatory Visit: Payer: Commercial Managed Care - HMO | Admitting: Gynecology

## 2017-04-26 ENCOUNTER — Ambulatory Visit: Payer: 59 | Admitting: Family Medicine

## 2017-06-11 ENCOUNTER — Ambulatory Visit: Payer: 59 | Admitting: Gynecology

## 2017-06-11 ENCOUNTER — Encounter: Payer: Self-pay | Admitting: Gynecology

## 2017-06-11 VITALS — BP 138/84 | Ht 65.0 in | Wt 252.0 lb

## 2017-06-11 DIAGNOSIS — Z1151 Encounter for screening for human papillomavirus (HPV): Secondary | ICD-10-CM | POA: Diagnosis not present

## 2017-06-11 DIAGNOSIS — Z01419 Encounter for gynecological examination (general) (routine) without abnormal findings: Secondary | ICD-10-CM | POA: Diagnosis not present

## 2017-06-11 NOTE — Addendum Note (Signed)
Addended by: Nelva Nay on: 06/11/2017 10:17 AM   Modules accepted: Orders

## 2017-06-11 NOTE — Patient Instructions (Signed)
Schedule your colonoscopy with either:  [facility]   Address: [address], [place], [place] [address]  [phone]    or  [facility]  Address: [address], [place],  [address]  [phone]       Call to Schedule your mammogram  Facilities in [place]: 1)  [facility]. [facility], [address] Phone: [phone] 2)  Dr. Bertrand at [facility]  [address] Phone: [phone]     Mammogram A mammogram is an X-ray test to find changes in a woman's breast. You should get a mammogram if:  You are 40 years of age or older  You have risk factors.   Your doctor recommends that you have one.  BEFORE THE TEST  Do not schedule the test the week before your period, especially if your breasts are sore during this time.  On the day of your mammogram:  Wash your breasts and armpits well. After washing, do not put on any deodorant or talcum powder on until after your test.   Eat and drink as you usually do.   Take your medicines as usual.   If you are diabetic and take insulin, make sure you:   Eat before coming for your test.   Take your insulin as usual.   If you cannot keep your appointment, call before the appointment to cancel. Schedule another appointment.  TEST  You will need to undress from the waist up. You will put on a hospital gown.   Your breast will be put on the mammogram machine, and it will press firmly on your breast with a piece of plastic called a compression paddle. This will make your breast flatter so that the machine can X-ray all parts of your breast.   Both breasts will be X-rayed. Each breast will be X-rayed from above and from the side. An X-ray might need to be taken again if the picture is not good enough.   The mammogram will last about 15 to 30 minutes.  AFTER THE TEST Finding out the results of your test Ask when  your test results will be ready. Make sure you get your test results.  Document Released: 07/31/2008 Document Revised: 04/23/2011 Document Reviewed: 07/31/2008 Exit[employer] Patient Information 2012 [employer].   

## 2017-06-11 NOTE — Progress Notes (Signed)
    Krista Newton [facility] [birth year] [id number]        52 y.o.  [id number] for annual gynecologic exam.  Not been in the office for over 3 years.  Doing well without gynecologic complaints  Past medical history,surgical history, problem list, medications, allergies, family history and social history were all reviewed and documented as reviewed in the EPIC chart.  ROS:  Performed with pertinent positives and negatives included in the history, assessment and plan.   Additional significant findings : None   Exam: Caryn Bee assistant Vitals:   06/11/17 0955  BP: 138/84  Weight: 252 lb (114.3 kg)  Height: 5\' 5"  (1.651 m)   Body mass index is 41.93 kg/m.  General appearance:  Normal affect, orientation and appearance. Skin: Grossly normal HEENT: Without gross lesions.  No cervical or supraclavicular adenopathy. Thyroid normal.  Lungs:  Clear without wheezing, rales or rhonchi Cardiac: RR, without RMG Abdominal:  Soft, nontender, without masses, guarding, rebound, organomegaly or hernia Breasts:  Examined lying and sitting without masses, retractions, discharge or axillary adenopathy. Pelvic:  Ext, BUS, Vagina: Normal  Cervix: Normal.  Pap smear/HPV  Uterus: Difficult to palpate but no gross masses or tenderness.  Adnexa: Without gross masses or tenderness    Anus and perineum: Normal   Rectovaginal: Normal sphincter tone without palpated masses or tenderness.    Assessment/Plan:  52 y.o. [id number] female for annual gynecologic exam.   1. Postmenopausal.  Doing well without significant hot flushes, night sweats, vaginal dryness or any bleeding.  Continue to monitor and report any issues or bleeding. 2. Mammography many years ago.  Stressed the need to schedule a screening mammogram.  Most common cancer in women reviewed.  Names and numbers provided.  Patient agrees to call and schedule.  Breast exam normal today. 3. Pap smear/HPV 2013.  Pap smear/HPV done today.  No history of significant  abnormal Pap smears previously. 4. Colonoscopy never.  I again strongly recommended she schedule a screening colonoscopy.  Second most common cancer in women reviewed.  Names and numbers provided.  Patient agrees to call and schedule. 5. DEXA never.  Will plan further into the menopause. 6. Health maintenance.  No routine lab work done as patient does this elsewhere.  Follow-up 1 year, sooner as needed.   Anastasio Auerbach MD, 10:09 AM 06/11/2017

## 2017-06-16 LAB — PAP IG AND HPV HIGH-RISK: HPV DNA High Risk: NOT DETECTED

## 2017-08-18 ENCOUNTER — Ambulatory Visit: Payer: 59 | Admitting: Family Medicine

## 2017-08-18 ENCOUNTER — Encounter: Payer: Self-pay | Admitting: Family Medicine

## 2017-08-18 ENCOUNTER — Ambulatory Visit (INDEPENDENT_AMBULATORY_CARE_PROVIDER_SITE_OTHER)
Admission: RE | Admit: 2017-08-18 | Discharge: 2017-08-18 | Disposition: A | Discharge status: Home / Self Care | Payer: 59 | Source: Ambulatory Visit | Attending: Family Medicine | Admitting: Family Medicine

## 2017-08-18 DIAGNOSIS — M542 Cervicalgia: Secondary | ICD-10-CM

## 2017-08-18 DIAGNOSIS — R058 Other specified cough: Secondary | ICD-10-CM | POA: Insufficient documentation

## 2017-08-18 DIAGNOSIS — R05 Cough: Secondary | ICD-10-CM

## 2017-08-18 DIAGNOSIS — R079 Chest pain, unspecified: Secondary | ICD-10-CM | POA: Diagnosis not present

## 2017-08-18 MED ORDER — MELOXICAM 15 MG PO TABS: 15.0000 mg | ORAL_TABLET | Freq: Every day | ORAL | 1 refills | Status: DC

## 2017-08-18 MED ORDER — METHOCARBAMOL 500 MG PO TABS: 500.0000 mg | ORAL_TABLET | Freq: Three times a day (TID) | ORAL | 1 refills | Status: DC | PRN

## 2017-08-18 NOTE — Progress Notes (Signed)
Subjective:    Patient ID: Krista Newton, female    DOB: [birth year], 52 y.o.   MRN: [id number]  HPI  Here for back pain   R upper back to shoulder  Some pain in neck with radiation-also R side  3 days worse  Unable to work   In bed all day yesterday  Eased a bit and then worse again this am   No trauma  [place] [employer]- noticed cold air on neck -? How it started   2 regular pillows  Has tried to change position   otc ibuprofen with some relief   Also some cough- phlegm with some brown specs / occ tiny bit of blood  When she clears throat - feels a tingle in her neck (more on r side )  No ST She has had some nosebleeds while traveling  No fever or aches     Wt Readings from Last 3 Encounters:  08/18/17 247 lb (112 kg)  06/11/17 252 lb (114.3 kg)  12/21/16 242 lb (109.8 kg)   Rotating head L makes pain worse  Trip to [place] in march- weather change /humidity change  Felt pretty good   BP Readings from Last 3 Encounters:  08/18/17 126/84  06/11/17 138/84  12/21/16 118/76   Pulse Readings from Last 3 Encounters:  08/18/17 86  12/21/16 86  04/29/16 74    Patient Active Problem List   Diagnosis Date Noted  . Neck pain 08/18/2017  . Productive cough 08/18/2017  . Exposure to communicable disease 01/19/2016  . Morbid obesity ([place]) 07/07/2015  . Essential hypertension 07/05/2015  . Hyperlipidemia 08/31/2014  . Diabetes mellitus type 2, controlled, without complications ([place]) 19/50/9326  . Leg cramps 02/06/2014  . Dry mouth 02/06/2014  . History of shingles 09/18/2013  . Carpal tunnel syndrome 05/31/2013  . Routine general medical examination at a health care facility 07/04/2012  . HEARING LOSS, RIGHT EAR 07/26/2008  . ALLERGIC RHINITIS 06/28/2008   Past Medical History:  Diagnosis Date  . Diabetes mellitus without complication ([place])   . Hypertension    Past Surgical History:  Procedure Laterality Date  . CARPAL TUNNEL RELEASE Right 04/24/14  .  CESAREAN SECTION    . TUBAL LIGATION     Social History   Tobacco Use  . Smoking status: Never Smoker  . Smokeless tobacco: Never Used  Substance Use Topics  . Alcohol use: Yes    Alcohol/week: 0.0 oz    Comment: Rare  . Drug use: No   Family History  Problem Relation Age of Onset  . Hypertension Mother   . Diabetes Mother   . Heart failure Mother   . Hypertension Father   . Diabetes Sister   . Kidney failure Brother    No Known Allergies Current Outpatient Medications on File Prior to Visit  Medication Sig Dispense Refill  . glipiZIDE (GLUCOTROL XL) 5 MG 24 hr tablet Take 1 tablet (5 mg total) by mouth daily with breakfast. 30 tablet 11  . glucose blood (ONE TOUCH ULTRA TEST) test strip TEST SUGAR TWICE A DAY AND AS NEEDED (dx. E11.9) 100 each 1  . hydrochlorothiazide (HYDRODIURIL) 25 MG tablet Take 1 tablet (25 mg total) by mouth every other day. 15 tablet 11  . naproxen sodium (ALEVE) 220 MG tablet Take 440 mg by mouth 2 (two) times daily as needed. Reported on 07/05/2015     No current facility-administered medications on file prior to visit.  Review of Systems  Constitutional: Negative for activity change, appetite change, fatigue, fever and unexpected weight change.  HENT: Negative for congestion, ear pain, rhinorrhea, sinus pressure and sore throat.   Eyes: Negative for pain, redness and visual disturbance.  Respiratory: Positive for cough. Negative for shortness of breath, wheezing and stridor.   Cardiovascular: Negative for chest pain and palpitations.  Gastrointestinal: Negative for abdominal pain, blood in stool, constipation and diarrhea.  Endocrine: Negative for polydipsia and polyuria.  Genitourinary: Negative for dysuria, frequency and urgency.  Musculoskeletal: Positive for back pain. Negative for arthralgias, joint swelling and myalgias.  Skin: Negative for pallor and rash.  Allergic/Immunologic: Negative for environmental allergies.  Neurological:  Negative for dizziness, syncope and headaches.  Hematological: Negative for adenopathy. Does not bruise/bleed easily.  Psychiatric/Behavioral: Negative for decreased concentration and dysphoric mood. The patient is not nervous/anxious.        Objective:   Physical Exam  Constitutional: She appears well-developed and well-nourished. No distress.  obese and well appearing   HENT:  Head: Normocephalic and atraumatic.  Mouth/Throat: Oropharynx is clear and moist.  Eyes: Pupils are equal, round, and reactive to light. Conjunctivae and EOM are normal.  Neck: Normal range of motion. Neck supple. No JVD present. Carotid bruit is not present. No thyromegaly present.  Cardiovascular: Normal rate, regular rhythm, normal heart sounds and intact distal pulses. Exam reveals no gallop.  Pulmonary/Chest: Effort normal and breath sounds normal. No respiratory distress. She has no wheezes. She has no rales. She exhibits tenderness.  No crackles  Abdominal: Soft. Bowel sounds are normal. She exhibits no distension, no abdominal bruit and no mass. There is no tenderness.  Musculoskeletal: She exhibits no edema.       Cervical back: She exhibits decreased range of motion, tenderness, bony tenderness and spasm. She exhibits no edema and no deformity.  Tender over lower CS and R sided musculature Flex- full with pain Ext 10 deg with pain  Rotate L- pain with any  Rotate R - full  Tilt hurts to left  No crepitus No skin change or rash   Tender over R ant chest wall     Lymphadenopathy:    She has no cervical adenopathy.  Neurological: She is alert. She has normal reflexes.  Skin: Skin is warm and dry. No rash noted. No erythema. No pallor.  Psychiatric: She has a normal mood and affect.          Assessment & Plan:   Problem List Items Addressed This Visit      Other   Neck pain    R sided rad to upper back and R ant cw Unsure if related to cough  CS film today  Adv use of heat Cervical  support pillow meloxicam 15 mg daily with food  Methocarbamol prn (caution of sedation) Update with rad rev   Meds ordered this encounter  Medications  . meloxicam (MOBIC) 15 MG tablet    Sig: Take 1 tablet (15 mg total) by mouth daily. With food    Dispense:  30 tablet    Refill:  1  . methocarbamol (ROBAXIN) 500 MG tablet    Sig: Take 1 tablet (500 mg total) by mouth 3 (three) times daily as needed for muscle spasms. Caution of sedation    Dispense:  30 tablet    Refill:  1         Relevant Orders   DG Cervical Spine Complete   Productive cough    With  occ brown sputum and few times scant blood (noted she had some nosebleeds) Reassuring vitals and exam  Some R sided cw pain  CXR today and update-plan to follow      Relevant Orders   DG Chest 2 View

## 2017-08-18 NOTE — Patient Instructions (Signed)
Look for a foam cervical support pillow (to use with or without your regular pillow)  Use heat for 10 minutes at a time when you can  Try the meloxicam daily with food (do not mix with aleve or ibuprofen)  The muscle relaxer is helpful - but can sedate   Xray of chest and cervical spine now  I will have a result tomorrow

## 2017-08-19 NOTE — Assessment & Plan Note (Signed)
With occ brown sputum and few times scant blood (noted she had some nosebleeds) Reassuring vitals and exam  Some R sided cw pain  CXR today and update-plan to follow

## 2017-08-19 NOTE — Assessment & Plan Note (Signed)
R sided rad to upper back and R ant cw Unsure if related to cough  CS film today  Adv use of heat Cervical support pillow meloxicam 15 mg daily with food  Methocarbamol prn (caution of sedation) Update with rad rev   Meds ordered this encounter  Medications  . meloxicam (MOBIC) 15 MG tablet    Sig: Take 1 tablet (15 mg total) by mouth daily. With food    Dispense:  30 tablet    Refill:  1  . methocarbamol (ROBAXIN) 500 MG tablet    Sig: Take 1 tablet (500 mg total) by mouth 3 (three) times daily as needed for muscle spasms. Caution of sedation    Dispense:  30 tablet    Refill:  1

## 2017-08-23 ENCOUNTER — Telehealth: Payer: Self-pay | Admitting: Family Medicine

## 2017-08-23 NOTE — Telephone Encounter (Signed)
Left VM requesting pt to call the office back CRM created 

## 2017-08-23 NOTE — Telephone Encounter (Signed)
Patient Name: Krista Newton Gender: Female DOB: July 08, 1965 Age: 52 Y 29 M 24 D Return Phone Number: [phone] (Primary) Address: City/State/Zip: [place] [address] Client [place] [facility] - Client Client Site [facility], Roque Lias - MD Contact Type Call Who Is Calling Patient / Member / Family / Caregiver Call Type Triage / Clinical Relationship To Patient Self Return Phone Number [phone] (Primary) Chief Complaint Arm Pain (known cause) Reason for Call Symptomatic / Request for [place] states that she is having right shoulder pain and medication is not working Translation No Nurse Assessment Guidelines Guideline Title Affirmed Question Affirmed Notes Nurse Date/Time (Eastern Time) Disp. Time Eilene Ghazi Time) Disposition Final User 08/22/2017 1:08:14 PM Attempt made - message left Rowan Blase 08/22/2017 1:08:39 PM Attempt made - message left Rowan Blase 08/22/2017 1:18:28 PM FINAL ATTEMPT MADE - message left

## 2017-08-23 NOTE — Telephone Encounter (Signed)
Copied from [place]. Topic: General - Other >> Aug 23, 2017  7:16 AM Krista Newton wrote: Reason for CRM: patient calling stating that she need another medication the Robaxin isn't working she is still having arm pain

## 2017-08-23 NOTE — Telephone Encounter (Signed)
We can try flexeril instead of robaxin (caution of sedation) Please send in Flexeril 10 mg  1/2 to 1 pill by mouth up to tid prn pain/muscle spasm   Would she be open to PT and /or orthopedic referral since she is not improving?  Let me know -thanks

## 2017-08-24 MED ORDER — CYCLOBENZAPRINE HCL 10 MG PO TABS: 5.0000 mg | ORAL_TABLET | Freq: Three times a day (TID) | ORAL | 1 refills | Status: DC | PRN

## 2017-08-24 NOTE — Telephone Encounter (Signed)
Please let her know I sent the flexeril  If not helpful let me know so I can do a referral   Thanks

## 2017-08-24 NOTE — Telephone Encounter (Signed)
Patient feels that the medication is not working to control her pain at this point and would like to try the Flexeril first- stressed that if this medication does not help- she may need to consider PT/ortho referral. Patient states she will call for that if she feels she needs it.  Please call medication- Flex[facility]il into [facility] [address].

## 2017-08-24 NOTE — Addendum Note (Signed)
Addended by: Loura Pardon A on: 08/24/2017 11:17 AM   Modules accepted: Orders

## 2017-08-24 NOTE — Telephone Encounter (Signed)
Pt. Has called multiple times.  There is a note from Dr. Alba Cory that the office said routed to NT, and wants a nurse to speak to her about the medication and referral.

## 2018-01-19 ENCOUNTER — Other Ambulatory Visit: Payer: Self-pay | Admitting: *Deleted

## 2018-01-19 MED ORDER — GLIPIZIDE ER 5 MG PO TB24: 5.0000 mg | ORAL_TABLET | Freq: Every day | ORAL | 0 refills | Status: DC

## 2018-01-19 NOTE — Telephone Encounter (Signed)
Med filled once and [facility] will reach out to pt to try and get appts scheduled

## 2018-01-19 NOTE — Telephone Encounter (Signed)
Pt hasn't had an A1c done in over a year and no future appts scheduled, please advise

## 2018-01-19 NOTE — Telephone Encounter (Signed)
Please schedule f/u appt with lab prior and refill until then

## 2018-01-27 ENCOUNTER — Telehealth: Payer: Self-pay | Admitting: Family Medicine

## 2018-01-27 DIAGNOSIS — I10 Essential (primary) hypertension: Secondary | ICD-10-CM

## 2018-01-27 DIAGNOSIS — E119 Type 2 diabetes mellitus without complications: Secondary | ICD-10-CM

## 2018-01-27 DIAGNOSIS — E7800 Pure hypercholesterolemia, unspecified: Secondary | ICD-10-CM

## 2018-01-27 NOTE — Telephone Encounter (Signed)
-----   Message from Ellamae Sia sent at 01/24/2018 11:53 AM EDT ----- Regarding: Lab orders for Friday, 9.13.19 Lab orders for a f/u appt

## 2018-01-28 ENCOUNTER — Other Ambulatory Visit: Payer: 59

## 2018-02-04 ENCOUNTER — Ambulatory Visit: Payer: 59 | Admitting: Family Medicine

## 2018-02-10 ENCOUNTER — Other Ambulatory Visit (INDEPENDENT_AMBULATORY_CARE_PROVIDER_SITE_OTHER): Payer: 59

## 2018-02-10 DIAGNOSIS — E7800 Pure hypercholesterolemia, unspecified: Secondary | ICD-10-CM

## 2018-02-10 DIAGNOSIS — E119 Type 2 diabetes mellitus without complications: Secondary | ICD-10-CM | POA: Diagnosis not present

## 2018-02-10 DIAGNOSIS — I10 Essential (primary) hypertension: Secondary | ICD-10-CM

## 2018-02-10 LAB — LIPID PANEL
Cholesterol: 224 mg/dL — ABNORMAL HIGH (ref 0–200)
HDL: 52 mg/dL (ref >39.00)
LDL Cholesterol: 142 mg/dL — ABNORMAL HIGH (ref 0–99)
NONHDL: 171.62
Total CHOL/HDL Ratio: 4
Triglycerides: 146 mg/dL (ref 0.0–149.0)
VLDL: 29.2 mg/dL (ref 0.0–40.0)

## 2018-02-10 LAB — CBC WITH DIFFERENTIAL/PLATELET
BASOS ABS: 0.1 10*3/uL (ref 0.0–0.1)
Basophils Relative: 0.7 % (ref 0.0–3.0)
Eosinophils Absolute: 0.2 10*3/uL (ref 0.0–0.7)
Eosinophils Relative: 2.3 % (ref 0.0–5.0)
HCT: 40.4 % (ref 36.0–46.0)
Hemoglobin: 13.9 g/dL (ref 12.0–15.0)
LYMPHS ABS: 2.2 10*3/uL (ref 0.7–4.0)
Lymphocytes Relative: 27.4 % (ref 12.0–46.0)
MCHC: 34.5 g/dL (ref 30.0–36.0)
MCV: 88.2 fl (ref 78.0–100.0)
MONO ABS: 0.6 10*3/uL (ref 0.1–1.0)
MONOS PCT: 6.9 % (ref 3.0–12.0)
NEUTROS PCT: 62.7 % (ref 43.0–77.0)
Neutro Abs: 5 10*3/uL (ref 1.4–7.7)
Platelets: 234 10*3/uL (ref 150.0–400.0)
RBC: 4.58 Mil/uL (ref 3.87–5.11)
RDW: 13.1 % (ref 11.5–15.5)
WBC: 8 10*3/uL (ref 4.0–10.5)

## 2018-02-10 LAB — COMPREHENSIVE METABOLIC PANEL
ALK PHOS: 76 U/L (ref 39–117)
ALT: 19 U/L (ref 0–35)
AST: 16 U/L (ref 0–37)
Albumin: 4.3 g/dL (ref 3.5–5.2)
BILIRUBIN TOTAL: 0.8 mg/dL (ref 0.2–1.2)
BUN: 13 mg/dL (ref 6–23)
CO2: 27 mEq/L (ref 19–32)
CREATININE: 0.71 mg/dL (ref 0.40–1.20)
Calcium: 9.7 mg/dL (ref 8.4–10.5)
Chloride: 101 mEq/L (ref 96–112)
GFR: 110.31 mL/min (ref >60.00)
GLUCOSE: 225 mg/dL — AB (ref 70–99)
Potassium: 4.3 mEq/L (ref 3.5–5.1)
SODIUM: 136 meq/L (ref 135–145)
TOTAL PROTEIN: 7.7 g/dL (ref 6.0–8.3)

## 2018-02-10 LAB — TSH: TSH: 2.68 u[IU]/mL (ref 0.35–4.50)

## 2018-02-10 LAB — HEMOGLOBIN A1C: HEMOGLOBIN A1C: 9.8 % — AB (ref 4.6–6.5)

## 2018-02-11 ENCOUNTER — Ambulatory Visit: Payer: 59 | Admitting: Family Medicine

## 2018-02-16 ENCOUNTER — Encounter: Payer: Self-pay | Admitting: Family Medicine

## 2018-02-16 ENCOUNTER — Ambulatory Visit: Payer: 59 | Admitting: Family Medicine

## 2018-02-16 VITALS — BP 135/85 | HR 83 | Temp 98.4°F | Ht 65.0 in | Wt 248.5 lb

## 2018-02-16 DIAGNOSIS — E7800 Pure hypercholesterolemia, unspecified: Secondary | ICD-10-CM | POA: Diagnosis not present

## 2018-02-16 DIAGNOSIS — I10 Essential (primary) hypertension: Secondary | ICD-10-CM

## 2018-02-16 DIAGNOSIS — E119 Type 2 diabetes mellitus without complications: Secondary | ICD-10-CM

## 2018-02-16 MED ORDER — GLUCOSE BLOOD VI STRP: ORAL_STRIP | 3 refills | Status: DC

## 2018-02-16 MED ORDER — METFORMIN HCL ER 500 MG PO TB24: 500.0000 mg | ORAL_TABLET | Freq: Every day | ORAL | 11 refills | Status: DC

## 2018-02-16 NOTE — Assessment & Plan Note (Signed)
bp in fair control at this time  BP Readings from Last 1 Encounters:  02/16/18 135/85   No changes needed Most recent labs reviewed  Disc lifstyle change with low sodium diet and exercise  Declines ace/arb

## 2018-02-16 NOTE — Assessment & Plan Note (Signed)
Discussed how this problem influences overall health and the risks it imposes  Reviewed plan for weight loss with lower calorie diet (via better food choices and also portion control or program like weight watchers) and exercise building up to or more than 30 minutes 5 days per week including some aerobic activity   No time for self care recently

## 2018-02-16 NOTE — Progress Notes (Signed)
Subjective:    Patient ID: Krista Newton, female    DOB: 06/14/1965, 52 y.o.   MRN: [id number]  HPI  Here for f/u of chronic health issues   Has been feeling good   MIL passed away last week  Was caring for her  Also going to school and working No time for self care   Wt Readings from Last 3 Encounters:  02/16/18 248 lb 8 oz (112.7 kg)  08/18/17 247 lb (112 kg)  06/11/17 252 lb (114.3 kg)  down 4 lb from January 41.35 kg/m   Will have a flu shot at work   bp is up a little  No cp or palpitations or headaches or edema  No side effects to medicines  BP Readings from Last 3 Encounters:  02/16/18 134/90  08/18/17 126/84  06/11/17 138/84     Diabetes Home sugar results - getting higher /no lows  DM diet -has not been able to watch diet or prep food (does the best she can)  Does not think glipizide  Exercise -no time  Symptoms-no excessive thirst or urination  A1C last  Lab Results  Component Value Date   HGBA1C 9.8 (H) 02/10/2018   No problems with medications  Renal protection-declines ace or arb  Has fear of metformin/renal effects --is now open to it  Last eye exam   Lab Results  Component Value Date   MICROALBUR <0.7 04/24/2016    Lab Results  Component Value Date   CHOL 224 (H) 02/10/2018   HDL 52.00 02/10/2018   LDLCALC 142 (H) 02/10/2018   TRIG 146.0 02/10/2018   CHOLHDL 4 02/10/2018  declines medication  No change from last time   Lab Results  Component Value Date   CREATININE 0.71 02/10/2018   BUN 13 02/10/2018   NA 136 02/10/2018   K 4.3 02/10/2018   CL 101 02/10/2018   CO2 27 02/10/2018   Lab Results  Component Value Date   ALT 19 02/10/2018   AST 16 02/10/2018   ALKPHOS 76 02/10/2018   BILITOT 0.8 02/10/2018   Lab Results  Component Value Date   TSH 2.68 02/10/2018     Patient Active Problem List   Diagnosis Date Noted  . Neck pain 08/18/2017  . Productive cough 08/18/2017  . Exposure to communicable disease 01/19/2016    . Morbid obesity ([place]) 07/07/2015  . Essential hypertension 07/05/2015  . Hyperlipidemia 08/31/2014  . Diabetes mellitus type 2, controlled, without complications ([place]) 73/53/2992  . Leg cramps 02/06/2014  . Dry mouth 02/06/2014  . History of shingles 09/18/2013  . Carpal tunnel syndrome 05/31/2013  . Routine general medical examination at a health care facility 07/04/2012  . HEARING LOSS, RIGHT EAR 07/26/2008  . ALLERGIC RHINITIS 06/28/2008   Past Medical History:  Diagnosis Date  . Diabetes mellitus without complication ([place])   . Hypertension    Past Surgical History:  Procedure Laterality Date  . CARPAL TUNNEL RELEASE Right 04/24/14  . CESAREAN SECTION    . TUBAL LIGATION     Social History   Tobacco Use  . Smoking status: Never Smoker  . Smokeless tobacco: Never Used  Substance Use Topics  . Alcohol use: Yes    Alcohol/week: 0.0 standard drinks    Comment: Rare  . Drug use: No   Family History  Problem Relation Age of Onset  . Hypertension Mother   . Diabetes Mother   . Heart failure Mother   . Hypertension Father   .  Diabetes Sister   . Kidney failure Brother    No Known Allergies Current Outpatient Medications on File Prior to Visit  Medication Sig Dispense Refill  . hydrochlorothiazide (HYDRODIURIL) 25 MG tablet Take 1 tablet (25 mg total) by mouth every other day. 15 tablet 11  . naproxen sodium (ALEVE) 220 MG tablet Take 440 mg by mouth 2 (two) times daily as needed. Reported on 07/05/2015     No current facility-administered medications on file prior to visit.     Review of Systems  Constitutional: Positive for fatigue. Negative for activity change, appetite change, fever and unexpected weight change.       Fatigue from busy schedule  HENT: Negative for congestion, ear pain, rhinorrhea, sinus pressure and sore throat.   Eyes: Negative for pain, redness and visual disturbance.  Respiratory: Negative for cough, shortness of breath and wheezing.    Cardiovascular: Negative for chest pain and palpitations.  Gastrointestinal: Negative for abdominal pain, blood in stool, constipation and diarrhea.  Endocrine: Negative for polydipsia and polyuria.  Genitourinary: Negative for dysuria, frequency and urgency.  Musculoskeletal: Negative for arthralgias, back pain and myalgias.  Skin: Negative for pallor and rash.  Allergic/Immunologic: Negative for environmental allergies.  Neurological: Negative for dizziness, syncope and headaches.  Hematological: Negative for adenopathy. Does not bruise/bleed easily.  Psychiatric/Behavioral: Negative for decreased concentration and dysphoric mood. The patient is not nervous/anxious.        Recent stress/caregiving       Objective:   Physical Exam  Constitutional: She appears well-developed and well-nourished. No distress.  obese and well appearing   HENT:  Head: Normocephalic and atraumatic.  Mouth/Throat: Oropharynx is clear and moist.  Eyes: Pupils are equal, round, and reactive to light. Conjunctivae and EOM are normal.  Neck: Normal range of motion. Neck supple. No JVD present. Carotid bruit is not present. No thyromegaly present.  Cardiovascular: Normal rate, regular rhythm, normal heart sounds and intact distal pulses. Exam reveals no gallop.  Pulmonary/Chest: Effort normal and breath sounds normal. No respiratory distress. She has no wheezes. She has no rales.  No crackles  Abdominal: Soft. Bowel sounds are normal. She exhibits no distension, no abdominal bruit and no mass. There is no tenderness.  Musculoskeletal: She exhibits no edema.  Lymphadenopathy:    She has no cervical adenopathy.  Neurological: She is alert. She has normal reflexes.  Skin: Skin is warm and dry. No rash noted.  Psychiatric: She has a normal mood and affect.  Pleasant           Assessment & Plan:   Problem List Items Addressed This Visit      Cardiovascular and Mediastinum   Essential hypertension -  Primary    bp in fair control at this time  BP Readings from Last 1 Encounters:  02/16/18 135/85   No changes needed Most recent labs reviewed  Disc lifstyle change with low sodium diet and exercise  Declines ace/arb      Relevant Orders   Renal function panel     Endocrine   Diabetes mellitus type 2, controlled, without complications ([place])    Lab Results  Component Value Date   HGBA1C 9.8 (H) 02/10/2018   This is up  No time for self care lately with care giving/school/work Now thinks she will be able to work on diet/exercise Has done DM teaching She has declined metformin in the past but wants to try it next (low dose to start) and hold the glipizide Disc monitoring  Will get flu shot at work Sent in metformin xr 500 mg daily (rev poss side eff will update)  Will likely need to adv dose later She declines ace/arb F/u 3 mo       Relevant Medications   metFORMIN (GLUCOPHAGE XR) 500 MG 24 hr tablet   Other Relevant Orders   Renal function panel   Hemoglobin A1c     Other   Hyperlipidemia    Disc goals for lipids and reasons to control them Rev last labs with pt Rev low sat fat diet in detail Pt declines tx/ statin  Will continue to work on diet       Morbid obesity ([place])    Discussed how this problem influences overall health and the risks it imposes  Reviewed plan for weight loss with lower calorie diet (via better food choices and also portion control or program like weight watchers) and exercise building up to or more than 30 minutes 5 days per week including some aerobic activity   No time for self care recently       Relevant Medications   metFORMIN (GLUCOPHAGE XR) 500 MG 24 hr tablet

## 2018-02-16 NOTE — Patient Instructions (Addendum)
Try to get most of your carbohydrates from produce (with the exception of white potatoes)  Eat less bread/pasta/rice/snack foods/cereals/sweets and other items from the middle of the grocery store (processed carbs)   Fit in exercise when you can (optimally 30 or more minutes five days per week)  When able- keep working on diet /exercise the best you can   Hold the glipizide Start metformin xr 500 mg daily with breakfast  If any intolerable side effects -hold it and let me know   Follow up in 3 months

## 2018-02-16 NOTE — Assessment & Plan Note (Signed)
Lab Results  Component Value Date   HGBA1C 9.8 (H) 02/10/2018   This is up  No time for self care lately with care giving/school/work Now thinks she will be able to work on diet/exercise Has done DM teaching She has declined metformin in the past but wants to try it next (low dose to start) and hold the glipizide Disc monitoring  Will get flu shot at work Sent in metformin xr 500 mg daily (rev poss side eff will update)  Will likely need to adv dose later She declines ace/arb F/u 3 mo

## 2018-02-16 NOTE — Assessment & Plan Note (Signed)
Disc goals for lipids and reasons to control them Rev last labs with pt Rev low sat fat diet in detail Pt declines tx/ statin  Will continue to work on diet

## 2018-02-17 ENCOUNTER — Other Ambulatory Visit: Payer: Self-pay | Admitting: Family Medicine

## 2018-03-10 ENCOUNTER — Other Ambulatory Visit: Payer: Self-pay | Admitting: *Deleted

## 2018-03-10 MED ORDER — HYDROCHLOROTHIAZIDE 25 MG PO TABS: 25.0000 mg | ORAL_TABLET | ORAL | 5 refills | Status: DC

## 2018-03-11 ENCOUNTER — Telehealth: Payer: Self-pay

## 2018-03-11 NOTE — Telephone Encounter (Signed)
Pt is aware as instructed and expressed understanding 

## 2018-03-11 NOTE — Telephone Encounter (Signed)
If she still has glipizide left, rec hold metformin and take glipizide with breakfast over weekend. Will have PCP make recs on Monday when she returns.

## 2018-03-11 NOTE — Telephone Encounter (Signed)
Copied from [place] [id number]. Topic: General - Other >> Mar 11, 2018  1:50 PM Janace Aris A wrote: Reason for CRM: pt called in, she says she doesn't like how she is feeling on the medication metFORMIN, she says her mouth had thrush recently, she reports a white kind of film and blistering, also when she swallows it hurts as if she had an allergic reaction, now pt has sore throat. Pt says she is having frequent urination, and stomach pains. Red eyes in the morning, also lower back pain.  She would like to have someone call her back in regards to this, as far as what to do going forward.

## 2018-03-11 NOTE — Telephone Encounter (Signed)
Spoke with pt informing her [name] is out of the office but her message has been forwarded to another provider to address.  Pt verbalizes understanding and gives permission to lvm.

## 2018-03-11 NOTE — Telephone Encounter (Signed)
Unable to reach pt by phone. Dr Glori Bickers out of office today and no computer access. Please advise. Pt last seen 02/16/18 and discussed if taking metformin XR had intolerable side effects to hold metformin and let Dr Glori Bickers know.

## 2018-03-11 NOTE — Telephone Encounter (Signed)
Patient called to speak with the nurse or doctor regarding the reactions she is having to metformin.  Please clarify and call patient back.  CB# [phone]

## 2018-03-13 NOTE — Telephone Encounter (Signed)
Please call her and see how she is feeling off the metformin  Let me know - I need to know if side effects are going away before making a plan

## 2018-03-14 MED ORDER — NYSTATIN 100000 UNIT/ML MT SUSP: 5.0000 mL | Freq: Three times a day (TID) | OROMUCOSAL | 0 refills | Status: DC

## 2018-03-14 NOTE — Telephone Encounter (Signed)
Pt said she had already taken her metformin on Friday so she has only been off of med for 2 days but her sxs haven't improved at all. Pt said she has a HA, and it felt like it burned her mouth and throat because she is hoarse and it felt like she had blisters in her mouth also possible thrush with white spots in her mouth but the white spots have resolved. She also had abd pain with it and she is also severely fatigued. Pt said the med also made her urinate frequently especially at night

## 2018-03-14 NOTE — Telephone Encounter (Signed)
I'm glad the mouth symptoms are improved - if she feels like she needs tx for thrush however please let me know  The frequent urination may be due to increased blood glucose off the metformin  If the headache and other symptoms do not improve in 2-3 more days please let me know/follow up   (if there is any chance there is another cause --? Viral) and it is not the metformin we will want to know   There is a condition we see in kids and some adults called hand/foot/mouth disease (viral)  please watch for any blisters on hands or feet as well   In the meantime please go ahead and get a list of covered diabetes medicines from her insurance to see what is affordable to try next for diabetes -and get me that list  Thanks

## 2018-03-14 NOTE — Telephone Encounter (Signed)
Pt notified of all of DR. Tower's instruction. Pt said she does want to try the med for thrush just to be on the safe side ([facility] [place] because it feels like her throat is burning when she eats or talks. Pt doesn't have any blisters on her hand and feet but I did advise pt if any occur to left [place] know asap. Pt will give it a few more days to see if sxs start to resolve because she's not sure if it's side eff of med or a virus either. Pt will f/u with [place] if sxs don't improve and if they do improve she will call back in 2-3 days with the list of alt DM meds

## 2018-03-14 NOTE — Telephone Encounter (Signed)
I sent nystatin solution to her pharmacy

## 2018-03-14 NOTE — Addendum Note (Signed)
Addended by: Loura Pardon A on: 03/14/2018 04:41 PM   Modules accepted: Orders

## 2018-04-07 MED ORDER — GLIPIZIDE ER 5 MG PO TB24: 5.0000 mg | ORAL_TABLET | Freq: Every day | ORAL | 1 refills | Status: DC

## 2018-04-07 NOTE — Telephone Encounter (Signed)
Rx sent and metformin removed from med list and pt notified

## 2018-04-07 NOTE — Telephone Encounter (Signed)
It was probably denied because she stopped it for a while- and it was not on med list  Then due to issues with metformin -I told her to go back to glipizide if tolerated Please refill for 6 mo  Thanks

## 2018-04-07 NOTE — Addendum Note (Signed)
Addended by: Tammi Sou on: 04/07/2018 02:37 PM   Modules accepted: Orders

## 2018-04-07 NOTE — Telephone Encounter (Signed)
Pt called asking why her glipizide rx is being denied. She said she cannot take the metformin due to side effects and does not feel she should have to come back in for an OV when she was just seen 02-16-18 for her Diabetes. She is aware Dr Glori Bickers is out of the office today and it will be tomorrow before she gets a response about the glipizide.

## 2018-05-20 ENCOUNTER — Other Ambulatory Visit (INDEPENDENT_AMBULATORY_CARE_PROVIDER_SITE_OTHER): Payer: 59

## 2018-05-20 DIAGNOSIS — E119 Type 2 diabetes mellitus without complications: Secondary | ICD-10-CM | POA: Diagnosis not present

## 2018-05-20 DIAGNOSIS — I10 Essential (primary) hypertension: Secondary | ICD-10-CM | POA: Diagnosis not present

## 2018-05-20 LAB — RENAL FUNCTION PANEL
Albumin: 3.8 g/dL (ref 3.5–5.2)
BUN: 11 mg/dL (ref 6–23)
CHLORIDE: 106 meq/L (ref 96–112)
CO2: 24 meq/L (ref 19–32)
CREATININE: 0.6 mg/dL (ref 0.40–1.20)
Calcium: 8.9 mg/dL (ref 8.4–10.5)
GFR: 133.83 mL/min (ref >60.00)
Glucose, Bld: 190 mg/dL — ABNORMAL HIGH (ref 70–99)
PHOSPHORUS: 3.6 mg/dL (ref 2.3–4.6)
Potassium: 3.7 mEq/L (ref 3.5–5.1)
SODIUM: 140 meq/L (ref 135–145)

## 2018-05-20 LAB — HEMOGLOBIN A1C: Hgb A1c MFr Bld: 10 % — ABNORMAL HIGH (ref 4.6–6.5)

## 2018-05-25 ENCOUNTER — Ambulatory Visit: Payer: 59 | Admitting: Family Medicine

## 2018-05-25 ENCOUNTER — Encounter: Payer: Self-pay | Admitting: Family Medicine

## 2018-05-25 VITALS — BP 132/76 | HR 95 | Temp 98.0°F | Ht 65.0 in | Wt 242.0 lb

## 2018-05-25 DIAGNOSIS — I10 Essential (primary) hypertension: Secondary | ICD-10-CM

## 2018-05-25 DIAGNOSIS — E119 Type 2 diabetes mellitus without complications: Secondary | ICD-10-CM | POA: Diagnosis not present

## 2018-05-25 MED ORDER — GLIPIZIDE ER 10 MG PO TB24: 10.0000 mg | ORAL_TABLET | Freq: Every day | ORAL | 3 refills | Status: DC

## 2018-05-25 NOTE — Assessment & Plan Note (Signed)
Lab Results  Component Value Date   HGBA1C 10.0 (H) 05/20/2018   This is up  Intol of metformin products  Will inc glipizide xl to 10 mg  Ref to DM teaching  Also ref to endocrinology for help with management in general  No time for self care- makes this tricky in terms of diet/execise and wt loss needed  Had flu shot  Declines arb or ace for renal protection

## 2018-05-25 NOTE — Assessment & Plan Note (Signed)
bp in fair control at this time  BP Readings from Last 1 Encounters:  05/25/18 132/76   No changes needed Most recent labs reviewed  Disc lifstyle change with low sodium diet and exercise

## 2018-05-25 NOTE — Patient Instructions (Signed)
Increase your glipizide ER 5 mg to 10 mg once daily  Pills you have left take 2 pills once daily   New px will be for 10 mg (printed)- fill when you need it and that will be one pill daily   We will refer you for diabetic teaching and also endocrinology (diabetes specialist) Our referral coordinator will call you about that   Keep working on health diet  Exercise if you

## 2018-05-25 NOTE — Assessment & Plan Note (Signed)
Discussed how this problem influences overall health and the risks it imposes  Reviewed plan for weight loss with lower calorie diet (via better food choices and also portion control or program like weight watchers) and exercise building up to or more than 30 minutes 5 days per week including some aerobic activity   Making effort re: no more sweet tea  No time for exercise

## 2018-05-25 NOTE — Progress Notes (Signed)
Subjective:    Patient ID: Krista Newton, female    DOB: December 25, 1965, 53 y.o.   MRN: [id number]  HPI Here for f/u of chronic health problems  Feels about the same  May have had thrush or rxn to metformin -throat burned  That is better now   Wt Readings from Last 3 Encounters:  05/25/18 242 lb (109.8 kg)  02/16/18 248 lb 8 oz (112.7 kg)  08/18/17 247 lb (112 kg)  down 6 lb  Working on it  Drinking sugar free lemonade (avoiding sugar in drinks) - in tea went from full sugar to 1/2 sugar (has a fair amt) -now no tea for a mo  Still no time to exercise  40.27 kg/m   Diabetes Home sugar results - are rising /no improvement  DM diet -tried 5 smaller meals per day Exercise -no time  Symptoms-none  A1C last  Lab Results  Component Value Date   HGBA1C 10.0 (H) 05/20/2018   Taking glipizide xl 5 mg daily  Did not tolerate metformin  Renal protection-has declined ace or arb Last eye exam    bp is stable today  No cp or palpitations or headaches or edema  No side effects to medicines  BP Readings from Last 3 Encounters:  05/25/18 132/76  02/16/18 135/85  08/18/17 126/84     Patient Active Problem List   Diagnosis Date Noted  . Neck pain 08/18/2017  . Productive cough 08/18/2017  . Exposure to communicable disease 01/19/2016  . Morbid obesity ([place]) 07/07/2015  . Essential hypertension 07/05/2015  . Hyperlipidemia 08/31/2014  . Diabetes mellitus type 2, controlled, without complications ([place]) 10/62/6948  . Leg cramps 02/06/2014  . Dry mouth 02/06/2014  . History of shingles 09/18/2013  . Carpal tunnel syndrome 05/31/2013  . Routine general medical examination at a health care facility 07/04/2012  . HEARING LOSS, RIGHT EAR 07/26/2008  . ALLERGIC RHINITIS 06/28/2008   Past Medical History:  Diagnosis Date  . Diabetes mellitus without complication ([place])   . Hypertension    Past Surgical History:  Procedure Laterality Date  . CARPAL TUNNEL RELEASE Right 04/24/14   . CESAREAN SECTION    . TUBAL LIGATION     Social History   Tobacco Use  . Smoking status: Never Smoker  . Smokeless tobacco: Never Used  Substance Use Topics  . Alcohol use: Yes    Alcohol/week: 0.0 standard drinks    Comment: Rare  . Drug use: No   Family History  Problem Relation Age of Onset  . Hypertension Mother   . Diabetes Mother   . Heart failure Mother   . Hypertension Father   . Diabetes Sister   . Kidney failure Brother    Allergies  Allergen Reactions  . Metformin And Related Other (See Comments)    The XR caused sore throat, HA, GI issues   Current Outpatient Medications on File Prior to Visit  Medication Sig Dispense Refill  . glipiZIDE (GLIPIZIDE XL) 5 MG 24 hr tablet Take 1 tablet (5 mg total) by mouth daily with breakfast. 90 tablet 1  . glucose blood (ONE TOUCH ULTRA TEST) test strip TEST SUGAR TWICE A DAY AND AS NEEDED (dx. E11.9) 100 each 3  . hydrochlorothiazide (HYDRODIURIL) 25 MG tablet Take 1 tablet (25 mg total) by mouth every other day. 15 tablet 5  . naproxen sodium (ALEVE) 220 MG tablet Take 440 mg by mouth 2 (two) times daily as needed. Reported on 07/05/2015  No current facility-administered medications on file prior to visit.     Review of Systems  Constitutional: Positive for fatigue. Negative for activity change, appetite change, fever and unexpected weight change.  HENT: Negative for congestion, ear pain, rhinorrhea, sinus pressure and sore throat.   Eyes: Negative for pain, redness and visual disturbance.  Respiratory: Negative for cough, shortness of breath and wheezing.   Cardiovascular: Negative for chest pain and palpitations.  Gastrointestinal: Negative for abdominal pain, blood in stool, constipation and diarrhea.  Endocrine: Negative for polydipsia and polyuria.  Genitourinary: Negative for dysuria, frequency and urgency.  Musculoskeletal: Negative for arthralgias, back pain and myalgias.  Skin: Negative for pallor and  rash.  Allergic/Immunologic: Negative for environmental allergies.  Neurological: Negative for dizziness, syncope and headaches.  Hematological: Negative for adenopathy. Does not bruise/bleed easily.  Psychiatric/Behavioral: Negative for decreased concentration and dysphoric mood. The patient is not nervous/anxious.        Objective:   Physical Exam Constitutional:      General: She is not in acute distress.    Appearance: Normal appearance. She is well-developed. She is obese. She is not ill-appearing.  HENT:     Head: Normocephalic and atraumatic.  Eyes:     Conjunctiva/sclera: Conjunctivae normal.     Pupils: Pupils are equal, round, and reactive to light.  Neck:     Musculoskeletal: Normal range of motion and neck supple.     Thyroid: No thyromegaly.     Vascular: No carotid bruit or JVD.  Cardiovascular:     Rate and Rhythm: Normal rate and regular rhythm.     Heart sounds: Normal heart sounds. No gallop.   Pulmonary:     Effort: Pulmonary effort is normal. No respiratory distress.     Breath sounds: Normal breath sounds. No wheezing or rales.  Abdominal:     General: Bowel sounds are normal. There is no distension or abdominal bruit.     Palpations: Abdomen is soft. There is no mass.     Tenderness: There is no abdominal tenderness.  Musculoskeletal:     Right lower leg: No edema.     Left lower leg: No edema.  Lymphadenopathy:     Cervical: No cervical adenopathy.  Skin:    General: Skin is warm and dry.     Capillary Refill: Capillary refill takes less than 2 seconds.     Findings: No rash.  Neurological:     Mental Status: She is alert. Mental status is at baseline.     Deep Tendon Reflexes: Reflexes are normal and symmetric.  Psychiatric:        Mood and Affect: Mood normal.           Assessment & Plan:   Problem List Items Addressed This Visit      Cardiovascular and Mediastinum   Essential hypertension    bp in fair control at this time  BP  Readings from Last 1 Encounters:  05/25/18 132/76   No changes needed Most recent labs reviewed  Disc lifstyle change with low sodium diet and exercise          Endocrine   Diabetes mellitus type 2, controlled, without complications ([place]) - Primary    Lab Results  Component Value Date   HGBA1C 10.0 (H) 05/20/2018   This is up  Intol of metformin products  Will inc glipizide xl to 10 mg  Ref to DM teaching  Also ref to endocrinology for help with management in general  No time for self care- makes this tricky in terms of diet/execise and wt loss needed  Had flu shot  Declines arb or ace for renal protection        Relevant Medications   glipiZIDE (GLUCOTROL XL) 10 MG 24 hr tablet   Other Relevant Orders   Ambulatory referral to diabetic education   Ambulatory referral to Endocrinology     Other   Morbid obesity [facility])    Discussed how this problem influences overall health and the risks it imposes  Reviewed plan for weight loss with lower calorie diet (via better food choices and also portion control or program like weight watchers) and exercise building up to or more than 30 minutes 5 days per week including some aerobic activity   Making effort re: no more sweet tea  No time for exercise       Relevant Medications   glipiZIDE (GLUCOTROL XL) 10 MG 24 hr tablet

## 2018-06-19 LAB — HM DIABETES EYE EXAM

## 2018-06-20 ENCOUNTER — Ambulatory Visit: Payer: 59 | Admitting: Endocrinology

## 2018-07-10 ENCOUNTER — Encounter: Payer: Self-pay | Admitting: Endocrinology

## 2018-07-10 NOTE — Progress Notes (Deleted)
Patient ID: Krista Newton, female   DOB: Oct 25, 1965, 53 y.o.   MRN: [id number]           Reason for Appointment: Consultation for Type 2 Diabetes  Referring PCP:   History of Present Illness:          Date of diagnosis of type 2 diabetes mellitus:        Background history:    Recent history:   Most recent A1c is done on  INSULIN regimen is:       Non-insulin hypoglycemic drugs the patient is taking are:  Current management, blood sugar patterns and problems identified:            Side effects from medications have been:      Meal times are:  Breakfast is at Lunch: Dinner:    Typical meal intake: Breakfast is                Exercise:    Glucose monitoring:  done  times a day         Glucometer: One Touch .       Blood Glucose readings by time of day and averages from meter download:  PREMEAL Breakfast Lunch Dinner Bedtime  Overall   Glucose range:       Median:        POST-MEAL PC Breakfast PC Lunch PC Dinner  Glucose range:     Median:       Dietician visit, most recent:  Weight history:  Wt Readings from Last 3 Encounters:  05/25/18 242 lb (109.8 kg)  02/16/18 248 lb 8 oz (112.7 kg)  08/18/17 247 lb (112 kg)    Glycemic control:   Lab Results  Component Value Date   HGBA1C 10.0 (H) 05/20/2018   HGBA1C 9.8 (H) 02/10/2018   HGBA1C 7.6 (H) 12/11/2016   Lab Results  Component Value Date   MICROALBUR <0.7 04/24/2016   LDLCALC 142 (H) 02/10/2018   CREATININE 0.60 05/20/2018   Lab Results  Component Value Date   MICRALBCREAT 0.6 04/24/2016    No results found for: FRUCTOSAMINE  No visits with results within 1 Week(s) from this visit.  Latest known visit with results is:  Lab on 05/20/2018  Component Date Value Ref Range Status  . Hgb A1c MFr Bld 05/20/2018 10.0* 4.6 - 6.5 % Final   Glycemic Control Guidelines for People with Diabetes:Non Diabetic:  <6%Goal of Therapy: <7%Additional Action Suggested:  >8%   . Sodium 05/20/2018 140   135 - 145 mEq/L Final  . Potassium 05/20/2018 3.7  3.5 - 5.1 mEq/L Final  . Chloride 05/20/2018 106  96 - 112 mEq/L Final  . CO2 05/20/2018 24  19 - 32 mEq/L Final  . Calcium 05/20/2018 8.9  8.4 - 10.5 mg/dL Final  . Albumin 05/20/2018 3.8  3.5 - 5.2 g/dL Final  . BUN 05/20/2018 11  6 - 23 mg/dL Final  . Creatinine, Ser 05/20/2018 0.60  0.40 - 1.20 mg/dL Final  . Glucose, Bld 05/20/2018 190* 70 - 99 mg/dL Final  . Phosphorus 05/20/2018 3.6  2.3 - 4.6 mg/dL Final  . GFR 05/20/2018 133.83  >60.00 mL/min Final    Allergies as of 07/11/2018      Reactions   Metformin And Related Other (See Comments)   The XR caused sore throat, HA, GI issues      Medication List       Accurate as of July 10, 2018  5:05 PM. Always use  your most recent med list.        ALEVE 220 MG tablet Generic drug:  naproxen sodium Take 440 mg by mouth 2 (two) times daily as needed. Reported on 07/05/2015   glipiZIDE 5 MG 24 hr tablet Commonly known as:  GLIPIZIDE XL Take 1 tablet (5 mg total) by mouth daily with breakfast.   glipiZIDE 10 MG 24 hr tablet Commonly known as:  GLUCOTROL XL Take 1 tablet (10 mg total) by mouth daily with breakfast.   glucose blood test strip Commonly known as:  ONE TOUCH ULTRA TEST TEST SUGAR TWICE A DAY AND AS NEEDED (dx. E11.9)   hydrochlorothiazide 25 MG tablet Commonly known as:  HYDRODIURIL Take 1 tablet (25 mg total) by mouth every other day.       Allergies:  Allergies  Allergen Reactions  . Metformin And Related Other (See Comments)    The XR caused sore throat, HA, GI issues    Past Medical History:  Diagnosis Date  . Diabetes mellitus without complication ([place])   . Hypertension     Past Surgical History:  Procedure Laterality Date  . CARPAL TUNNEL RELEASE Right 04/24/14  . CESAREAN SECTION    . TUBAL LIGATION      Family History  Problem Relation Age of Onset  . Hypertension Mother   . Diabetes Mother   . Heart failure Mother   .  Hypertension Father   . Diabetes Sister   . Kidney failure Brother     Social History:  reports that she has never smoked. She has never used smokeless tobacco. She reports current alcohol use. She reports that she does not use drugs.   Review of Systems   Lipid history:     Lab Results  Component Value Date   CHOL 224 (H) 02/10/2018   HDL 52.00 02/10/2018   LDLCALC 142 (H) 02/10/2018   TRIG 146.0 02/10/2018   CHOLHDL 4 02/10/2018           Hypertension: Has been present  BP Readings from Last 3 Encounters:  05/25/18 132/76  02/16/18 135/85  08/18/17 126/84    Most recent eye exam was in  Most recent foot exam:  Currently known complications of diabetes:  LABS:  No visits with results within 1 Week(s) from this visit.  Latest known visit with results is:  Lab on 05/20/2018  Component Date Value Ref Range Status  . Hgb A1c MFr Bld 05/20/2018 10.0* 4.6 - 6.5 % Final   Glycemic Control Guidelines for People with Diabetes:Non Diabetic:  <6%Goal of Therapy: <7%Additional Action Suggested:  >8%   . Sodium 05/20/2018 140  135 - 145 mEq/L Final  . Potassium 05/20/2018 3.7  3.5 - 5.1 mEq/L Final  . Chloride 05/20/2018 106  96 - 112 mEq/L Final  . CO2 05/20/2018 24  19 - 32 mEq/L Final  . Calcium 05/20/2018 8.9  8.4 - 10.5 mg/dL Final  . Albumin 05/20/2018 3.8  3.5 - 5.2 g/dL Final  . BUN 05/20/2018 11  6 - 23 mg/dL Final  . Creatinine, Ser 05/20/2018 0.60  0.40 - 1.20 mg/dL Final  . Glucose, Bld 05/20/2018 190* 70 - 99 mg/dL Final  . Phosphorus 05/20/2018 3.6  2.3 - 4.6 mg/dL Final  . GFR 05/20/2018 133.83  >60.00 mL/min Final    Physical Examination:  There were no vitals taken for this visit.  GENERAL:         Patient has generalized obesity.    HEENT:  Eye exam shows normal external appearance.  Fundus exam shows no retinopathy.  Oral exam shows normal mucosa .   NECK:   There is no lymphadenopathy  Thyroid is not enlarged and no nodules felt.    Carotids are normal to palpation and no bruit heard  LUNGS:         Chest is symmetrical. Lungs are clear to auscultation.Marland Kitchen   HEART:         Heart sounds:  S1 and S2 are normal. No murmur or click heard., no S3 or S4.   ABDOMEN:   There is no distention present. Liver and spleen are not palpable.  No other mass or tenderness present.    NEUROLOGICAL:   Ankle jerks are absent bilaterally.    Diabetic Foot Exam - Simple   No data filed             Vibration sense is  reduced in distal first toes.  MUSCULOSKELETAL:  There is no swelling or deformity of the peripheral joints.     EXTREMITIES:     There is no ankle edema.  SKIN:       No rash or lesions of concern.        ASSESSMENT:  Diabetes type 2  See history of present illness for detailed discussion of current diabetes management, blood sugar patterns and problems identified  Recent A1c indicates  Current treatment regimen is  Complications of diabetes:    PLAN:    1. Glucose monitoring: . Patient advised to check readings either fasting or 2 hours after meals  2.  Diabetes education: . Patient will need  3.  Lifestyle changes: . Dietary changes: . Exercise regimen:  4.  Medication changes needed: .   5.  Preventive care needed:  .   6.  Follow-up:    There are no Patient Instructions on file for this visit.   Consultation note has been sent to the referring physician  Elayne Snare 07/10/2018, 5:05 PM   Note: This office note was prepared with Dragon voice recognition system technology. Any transcriptional errors that result from this process are unintentional.

## 2018-07-11 ENCOUNTER — Ambulatory Visit: Payer: 59 | Admitting: Endocrinology

## 2018-08-01 ENCOUNTER — Other Ambulatory Visit: Payer: Self-pay

## 2018-08-01 ENCOUNTER — Other Ambulatory Visit: Payer: Self-pay | Admitting: Internal Medicine

## 2018-08-01 ENCOUNTER — Encounter: Payer: Self-pay | Admitting: Internal Medicine

## 2018-08-01 ENCOUNTER — Ambulatory Visit: Payer: 59 | Admitting: Internal Medicine

## 2018-08-01 VITALS — BP 162/98 | HR 80 | Ht 65.0 in | Wt 246.0 lb

## 2018-08-01 DIAGNOSIS — E1165 Type 2 diabetes mellitus with hyperglycemia: Secondary | ICD-10-CM | POA: Diagnosis not present

## 2018-08-01 MED ORDER — GLUCOSE BLOOD VI STRP: ORAL_STRIP | 3 refills | Status: DC

## 2018-08-01 MED ORDER — ONETOUCH ULTRA 2 W/DEVICE KIT: 1.0000 | PACK | 0 refills | Status: DC

## 2018-08-01 MED ORDER — SEMAGLUTIDE 3 MG PO TABS: 3.0000 mg | ORAL_TABLET | Freq: Every day | ORAL | 0 refills | Status: DC

## 2018-08-01 MED ORDER — SEMAGLUTIDE 7 MG PO TABS: 7.0000 mg | ORAL_TABLET | Freq: Every day | ORAL | 3 refills | Status: DC

## 2018-08-01 MED ORDER — ONETOUCH ULTRASOFT LANCETS MISC: 12 refills | Status: AC

## 2018-08-01 MED ORDER — GLIPIZIDE ER 10 MG PO TB24: 10.0000 mg | ORAL_TABLET | Freq: Two times a day (BID) | ORAL | 6 refills | Status: DC

## 2018-08-01 NOTE — Progress Notes (Signed)
Name: Krista Newton  MRN/ DOB: [id number], 09/26/1965   Age/ Sex: 53 y.o., female    PCP: Abner Greenspan, MD   Reason for Endocrinology Evaluation: Type 2 Diabetes Mellitus     Date of Initial Endocrinology Visit: 08/01/2018     PATIENT IDENTIFIER: Krista Newton is a 53 y.o. female with a past medical history of HTN and T2DM. The patient presented for initial endocrinology clinic visit on 08/01/2018 for consultative assistance with her diabetes management.    HPI: Krista Newton was    Diagnosed with T2DM since 2015 Prior Medications tried/Intolerance: Metformin- throat chemical burn  Currently checking blood sugars 1 x / day,  before breakfast Hypoglycemia episodes : no        Hemoglobin A1c has ranged from 7.3% in 2017, peaking at 10.0% in 2020. Patient required assistance for hypoglycemia: no Patient has required hospitalization within the last 1 year from hyper or hypoglycemia: no  In terms of diet, the patient drinks diet sodas , snacks on popcorn.   HOME DIABETES REGIMEN: Glipizide 10 mg QAM   Statin: no ACE-I/ARB: no Prior Diabetic Education: yes   METER DOWNLOAD SUMMARY: Did not bring    DIABETIC COMPLICATIONS: Microvascular complications:    Denies: CKD, neuropathy, retinopathy  Last eye exam: Completed 2019   Macrovascular complications:    Denies: CAD, PVD, CVA   PAST HISTORY: Past Medical History:  Past Medical History:  Diagnosis Date  . Diabetes mellitus without complication ([place])   . Hypertension    Past Surgical History:  Past Surgical History:  Procedure Laterality Date  . CARPAL TUNNEL RELEASE Right 04/24/14  . CESAREAN SECTION    . TUBAL LIGATION        Social History:  reports that she has never smoked. She has never used smokeless tobacco. She reports current alcohol use. She reports that she does not use drugs. Family History:  Family History  Problem Relation Age of Onset  . Hypertension Mother   . Diabetes Mother    . Heart failure Mother   . Hypertension Father   . Diabetes Sister   . Kidney failure Brother      HOME MEDICATIONS: Allergies as of 08/01/2018      Reactions   Metformin And Related Other (See Comments)   The XR caused sore throat, HA, GI issues      Medication List       Accurate as of August 01, 2018  1:08 PM. Always use your most recent med list.        Aleve 220 MG tablet Generic drug:  naproxen sodium Take 440 mg by mouth 2 (two) times daily as needed. Reported on 07/05/2015   glipiZIDE 10 MG 24 hr tablet Commonly known as:  GLUCOTROL XL Take 1 tablet (10 mg total) by mouth 2 (two) times daily with a meal.   glucose blood test strip Commonly known as:  ONE TOUCH ULTRA TEST TEST SUGAR TWICE A DAY AND AS NEEDED (dx. E11.9)   hydrochlorothiazide 25 MG tablet Commonly known as:  HYDRODIURIL Take 1 tablet (25 mg total) by mouth every other day.   Semaglutide 7 MG Tabs Commonly known as:  Rybelsus Take 7 mg by mouth daily.        ALLERGIES: Allergies  Allergen Reactions  . Metformin And Related Other (See Comments)    The XR caused sore throat, HA, GI issues     REVIEW OF SYSTEMS: A comprehensive ROS was conducted with  the patient and is negative except as per HPI and below:  Review of Systems  Constitutional: Negative for fever.  HENT: Negative for congestion and sore throat.   Eyes: Negative for blurred vision and pain.  Cardiovascular: Negative for chest pain and palpitations.  Genitourinary: Negative for frequency.  Endo/Heme/Allergies: Negative for polydipsia.      OBJECTIVE:   VITAL SIGNS: BP (!) 162/98 (BP Location: Right Arm, Patient Position: Sitting, Cuff Size: Small)   Pulse 80   Ht 5\' 5"  (1.651 m)   Wt 246 lb (111.6 kg)   SpO2 98%   BMI 40.94 kg/m    PHYSICAL EXAM:  General: Pt appears well and is in NAD  Hydration: Well-hydrated with moist mucous membranes and good skin turgor  HEENT: Head: Unremarkable with good dentition.  Oropharynx clear without exudate.  Eyes: External eye exam normal without stare, lid lag or exophthalmos.  EOM intact.  PERRL.  Neck: General: Supple without adenopathy or carotid bruits. Thyroid: Thyroid size normal.  No goiter or nodules appreciated. No thyroid bruit.  Lungs: Clear with good BS bilat with no rales, rhonchi, or wheezes  Heart: RRR with normal S1 and S2 and no gallops; no murmurs; no rub  Abdomen: Normoactive bowel sounds, soft, nontender, without masses or organomegaly palpable  Extremities:  Lower extremities - No pretibial edema. No lesions.  Skin: Normal texture and temperature to palpation. No rash noted. No Acanthosis nigricans/skin tags. No lipohypertrophy.  Neuro: MS is good with appropriate affect, pt is alert and Ox3    DM foot exam: 08/01/2018 The skin of the feet is intact without sores or ulcerations. The pedal pulses are 2+ on right and 2+ on left. The sensation is intact to a screening 5.07, 10 gram monofilament bilaterally   DATA REVIEWED:  Lab Results  Component Value Date   HGBA1C 10.0 (H) 05/20/2018   HGBA1C 9.8 (H) 02/10/2018   HGBA1C 7.6 (H) 12/11/2016   Lab Results  Component Value Date   MICROALBUR <0.7 04/24/2016   LDLCALC 142 (H) 02/10/2018   CREATININE 0.60 05/20/2018   Lab Results  Component Value Date   MICRALBCREAT 0.6 04/24/2016    Lab Results  Component Value Date   CHOL 224 (H) 02/10/2018   HDL 52.00 02/10/2018   LDLCALC 142 (H) 02/10/2018   TRIG 146.0 02/10/2018   CHOLHDL 4 02/10/2018        ASSESSMENT / PLAN / RECOMMENDATIONS:   1) Type 2 Diabetes Mellitus, Poorly controlled, With out complications - Most recent A1c of 10.0 %. Goal A1c < 7.0 %.    Plan: GENERAL: I have discussed with the patient the pathophysiology of diabetes. We went over the natural progression of the disease. We talked about both insulin resistance and insulin deficiency. We stressed the importance of lifestyle changes including diet and  exercise. I explained the complications associated with diabetes including retinopathy, nephropathy, neuropathy as well as increased risk of cardiovascular disease. We went over the benefit seen with glycemic control.   I explained to the patient that diabetic patients are at higher than normal risk for amputations. The patient was informed that diabetes is the number one cause of non-traumatic amputations in [place].  Patient is intolerant to metformin, she is already on glipizide we will increase the dose as below, we discussed the risk of weight gain with this.  I have explained to her what that with her and controlled diabetes, the quickest way to bring her glucose down is with insulin, patient  does not want to try this at this time, she would like to avoid any injectables.  I am hesitant to start her on any SGLT2 inhibitors at this time, due to severe hyperglycemia and increased risk of genital infections.  She is also not a good candidate for pioglitazone due to risk of weight gain.  I have recommended adding a GLP-1 agonist, and since she would like to avoid any injectables, we discussed the only option of oral GLP-1 agonist on the market, we discussed the risk of pancreatitis but more commonly the risk of nausea and diarrhea, she has no personal or family history of medullary thyroid cancer.  We discussed that this has been found in rats but no human data.    MEDICATIONS:  Increase glipizide 10 mg XL to twice a day with meals  Start Rybelsus  3 mg daily for 30 days, then increase to 7 mg daily -she was given samples of the 3 mg daily tablets, she was also given a co-pay card for $10 a month supply.   EDUCATION / INSTRUCTIONS:  BG monitoring instructions: Patient is instructed to check her blood sugars 2 times a day, fasting and bedtime.  Call [facility] if: BG persistently < 70 or > 300. . I reviewed the Rule of 15 for the treatment of hypoglycemia in detail with  the patient. Literature supplied.   2) Diabetic complications:   Eye: Does not have known diabetic retinopathy.   Neuro/ Feet: Does not have known diabetic peripheral neuropathy.  Renal: Patient does not have known baseline CKD. She is not on an ACEI/ARB at present.   3) Lipids: Patient is not on a statin.  Her 10-year ASCVD risk is at 26.7% , patient would benefit from a high intensity statin such as atorvastatin 40 mg or rosuvastatin 20 mg daily, I will defer this to her PCP assuming there are no contraindications.    4) Hypertension: She is above goal of < 140/90 mmHg. Pt did not take her meds today.    F/u in 6 weeks       Signed electronically by: Mack Guise, MD  [facility]  [facility] [address] [place], [place] [address] Phone: [phone] FAX: [phone]   CC: Tower, Wynelle Fanny, MD [place] [address] Phone: [phone]  Fax: [phone]    Return to Endocrinology clinic as below: Future Appointments  Date Time Provider [place]  09/12/2018 11:10 AM Denzell Colasanti, Melanie Crazier, MD [facility] None

## 2018-08-01 NOTE — Patient Instructions (Addendum)
-   Increase Glipizide 10 mg Xl to twice a day with meals - Start Rybelsus  3 mg daily for 30 days, then increase to 7 mg daily , if no nausea or diarrhea.   - Check sugar twice a day (fasting and bedtime) - Bring meter on next visit  - Choose healthy, lower carb lower calorie snacks: toss salad, cooked vegetables, cottage cheese, peanut butter, low fat cheese / string cheese, lower sodium deli meat, tuna salad or chicken salad

## 2018-09-12 ENCOUNTER — Ambulatory Visit (INDEPENDENT_AMBULATORY_CARE_PROVIDER_SITE_OTHER): Payer: 59 | Admitting: Internal Medicine

## 2018-09-12 ENCOUNTER — Other Ambulatory Visit: Payer: Self-pay

## 2018-09-12 ENCOUNTER — Encounter: Payer: Self-pay | Admitting: Internal Medicine

## 2018-09-12 DIAGNOSIS — E1165 Type 2 diabetes mellitus with hyperglycemia: Secondary | ICD-10-CM | POA: Diagnosis not present

## 2018-09-12 MED ORDER — EMPAGLIFLOZIN 10 MG PO TABS: 10.0000 mg | ORAL_TABLET | Freq: Every day | ORAL | 1 refills | Status: DC

## 2018-09-12 NOTE — Progress Notes (Signed)
Virtual Visit via Video Note  I connected with Krista Newton on 09/12/18 at 11:10 AM EDT by a video enabled telemedicine application and verified that I am speaking with the correct person using two identifiers.   I discussed the limitations of evaluation and management by telemedicine and the availability of in person appointments. The patient expressed understanding and agreed to proceed.   -Location of the patient : Home -Location of the provider : Office -The names of all persons participating in the telemedicine service : Pt and myself        Name: Krista Newton  Age/ Sex: 53 y.o., female   MRN/ DOB: [id number], Sep 17, 1965     PCP: Abner Greenspan, MD   Reason for Endocrinology Evaluation: Type 2 Diabetes Mellitus     Initial Endocrinology Clinic Visit: 08/01/2018      PATIENT IDENTIFIER: Krista Newton is a 53 y.o. female with a past medical history of HTN and T2DM. The patient has followed with Endocrinology clinic since 08/01/2018 for consultative assistance with management of her diabetes.  DIABETIC HISTORY:  [name] was diagnosed with T2DM in 2015. She reports intolerance to Metformin (Throat chemical burn ) . Her hemoglobin A1c has ranged from 7.3% in 2017, peaking at 10.0% in 2020.  ON her initial visit to our clinic her A1c was 10.0 % and was on Glipizide.   SUBJECTIVE:   During the last visit (08/01/2018): Increased Glipizide to 10 mg XL BID, started Rybelsus   Today (09/12/2018): [name] is here for a 6 week virtual follow up on diabetes management. She checks her blood sugars 2 times daily, preprandial to breakfast and supper. The patient has not had hypoglycemic episodes since the last clinic visit. Otherwise, the patient has not required any recent emergency interventions for hypoglycemia and has not had recent hospitalizations secondary to hyper or hypoglycemic episodes.  She did not tolerate the Rybelsus 3 mg daily, developed diarrhea.   ROS: As  per HPI and as detailed below: Review of Systems  Constitutional: Negative for fever.  Respiratory: Negative for cough.   Cardiovascular: Negative for chest pain and palpitations.  Gastrointestinal: Negative for diarrhea and nausea.  Genitourinary: Negative for frequency.  Endo/Heme/Allergies: Negative for polydipsia.      HOME DIABETES REGIMEN:  Glipizide XL 10 mg BID  Rybelsus- intolerant.      GLUCOSE LOG : Date Breakfast  Dinner Bedtime  09/12/18 180    4/26 172 136 158  4/25 254 180   4/24 211 159       HISTORY:  Past Medical History:  Past Medical History:  Diagnosis Date  . Diabetes mellitus without complication ([place])   . Hypertension     Past Surgical History:  Past Surgical History:  Procedure Laterality Date  . CARPAL TUNNEL RELEASE Right 04/24/14  . CESAREAN SECTION    . TUBAL LIGATION       Social History:  reports that she has never smoked. She has never used smokeless tobacco. She reports current alcohol use. She reports that she does not use drugs. Family History:  Family History  Problem Relation Age of Onset  . Hypertension Mother   . Diabetes Mother   . Heart failure Mother   . Hypertension Father   . Diabetes Sister   . Kidney failure Brother       HOME MEDICATIONS: Allergies as of 09/12/2018      Reactions   Metformin And Related Other (See Comments)  The XR caused sore throat, HA, GI issues      Medication List       Accurate as of September 12, 2018 10:35 AM. Always use your most recent med list.        Aleve 220 MG tablet Generic drug:  naproxen sodium Take 440 mg by mouth 2 (two) times daily as needed. Reported on 07/05/2015   Bydureon 2 MG Srer Generic drug:  Exenatide ER Please specify directions, refills and quantity   glipiZIDE 10 MG 24 hr tablet Commonly known as:  GLUCOTROL XL Take 1 tablet (10 mg total) by mouth 2 (two) times daily with a meal.   glucose blood test strip Commonly known as:  ONE TOUCH ULTRA  TEST TEST SUGAR TWICE A DAY(dx. E11.9)   hydrochlorothiazide 25 MG tablet Commonly known as:  HYDRODIURIL Take 1 tablet (25 mg total) by mouth every other day.   ONE TOUCH ULTRA 2 w/Device Kit 1 Device by Does not apply route as directed.   onetouch ultrasoft lancets Twice daily         DATA REVIEWED:  Lab Results  Component Value Date   HGBA1C 10.0 (H) 05/20/2018   HGBA1C 9.8 (H) 02/10/2018   HGBA1C 7.6 (H) 12/11/2016   Lab Results  Component Value Date   MICROALBUR <0.7 04/24/2016   LDLCALC 142 (H) 02/10/2018   CREATININE 0.60 05/20/2018   Lab Results  Component Value Date   MICRALBCREAT 0.6 04/24/2016     Lab Results  Component Value Date   CHOL 224 (H) 02/10/2018   HDL 52.00 02/10/2018   LDLCALC 142 (H) 02/10/2018   TRIG 146.0 02/10/2018   CHOLHDL 4 02/10/2018         ASSESSMENT / PLAN / RECOMMENDATIONS:   1) Type 2 Diabetes Mellitus, Poorly controlled, Without complications - Most recent A1c of 10.0 %. Goal A1c < 7.0 %.    Plan:  - Pt continues with the occasional hyperglycemia - She continues to try and do better with lifestyle changes.  - She is intolerant to metformin and Rybelsus.  - We discussed other add-on therapy such as TZD but patient reluctant to try this class due to weight gain, we also discussed SGLT-2 inhibitors, we discussed benefits and risks such as genital infections. Pt agreed to try Jardiance, she was given information for co-pay to active it online.   MEDICATIONS:  Continue Glipizide XL 10 mg BID   Jardiance 10 mg daily   EDUCATION / INSTRUCTIONS:  BG monitoring instructions: Patient is instructed to check her blood sugars 2 times a day, fasting and supper.  Call [facility] if: BG persistently < 70 or > 300. . I reviewed the Rule of 15 for the treatment of hypoglycemia in detail with the patient. Literature supplied.   I discussed the assessment and treatment plan with the patient. The patient was  provided an opportunity to ask questions and all were answered. The patient agreed with the plan and demonstrated an understanding of the instructions.   The patient was advised to call back or seek an in-person evaluation if the symptoms worsen or if the condition fails to improve as anticipated.    F/U in 8 weeks.    Signed electronically by: Mack Guise, MD  [facility]  [facility] [address] [place], [place] [address] Phone: [phone] FAX: [phone]   CC: Tower, Wynelle Fanny, MD [place] [address] Phone: [phone]  Fax: [phone]  Return to Endocrinology clinic as below: Future Appointments  Date Time Provider [place]  09/12/2018 11:10 AM Trevonn Hallum, Melanie Crazier, MD [facility] None

## 2018-09-14 DIAGNOSIS — E119 Type 2 diabetes mellitus without complications: Secondary | ICD-10-CM | POA: Diagnosis not present

## 2018-09-14 DIAGNOSIS — H35033 Hypertensive retinopathy, bilateral: Secondary | ICD-10-CM | POA: Diagnosis not present

## 2018-09-14 DIAGNOSIS — H2513 Age-related nuclear cataract, bilateral: Secondary | ICD-10-CM | POA: Diagnosis not present

## 2018-10-04 ENCOUNTER — Encounter: Payer: Self-pay | Admitting: Family Medicine

## 2018-10-04 ENCOUNTER — Ambulatory Visit (INDEPENDENT_AMBULATORY_CARE_PROVIDER_SITE_OTHER): Payer: 59 | Admitting: Family Medicine

## 2018-10-04 DIAGNOSIS — R200 Anesthesia of skin: Secondary | ICD-10-CM

## 2018-10-04 DIAGNOSIS — R202 Paresthesia of skin: Secondary | ICD-10-CM

## 2018-10-04 DIAGNOSIS — B373 Candidiasis of vulva and vagina: Secondary | ICD-10-CM

## 2018-10-04 DIAGNOSIS — B3731 Acute candidiasis of vulva and vagina: Secondary | ICD-10-CM

## 2018-10-04 MED ORDER — TERCONAZOLE 0.8 % VA CREA: TOPICAL_CREAM | VAGINAL | 0 refills | Status: DC

## 2018-10-04 MED ORDER — FLUCONAZOLE 150 MG PO TABS: ORAL_TABLET | ORAL | 0 refills | Status: DC

## 2018-10-04 NOTE — Assessment & Plan Note (Signed)
Virtual visit-could not do exam or wet prep but symptoms sound like yeast (in diabetic who has started Jardiance which has this side effect)  Px diflucan 150 mg now and repeat in 3 d  Also terazol cream to use externally as needed  Update if not starting to improve in a week or if worsening   Disc yogurt intake-may help prevent  If this becomes recurrent-may need to change medication

## 2018-10-04 NOTE — Patient Instructions (Signed)
The Jardiance will increase your risk of vaginal and skin yeast infection  I am unsure if area on your leg could also be from yeast  Take the diflucan as directed one pill today and one in 3 days Also use terazol cream on itchy areas daily  Update if not starting to improve in a week or if worsening    Eating yogurt may help prevent yeast  Also avoid sugar if you can  Continue follow up with your diabetes doctor  You may have to change the Jardiance if yeast infections become frequent

## 2018-10-04 NOTE — Assessment & Plan Note (Signed)
Per pt-ankle and outer leg without redness or other skin change No symptoms of neuropathy in feet or toes Asked her to watch for a rash or redness (esp blisters which could indicate shingles) If this is yeast-the diflucan may help  Alert [place] and endocrinology if this persists

## 2018-10-04 NOTE — Progress Notes (Signed)
Virtual Visit via Video Note  I connected with Krista Newton on 10/04/18 at 10:45 AM EDT by a video enabled telemedicine application and verified that I am speaking with the correct person using two identifiers.  Location: Patient: her car Provider: office    I discussed the limitations of evaluation and management by telemedicine and the availability of in person appointments. The patient expressed understanding and agreed to proceed.  History of Present Illness: She presents with concerns of yeast   Her DM doctor changed her to Jardiance  At risk for yeast infections   She has noticed 2 wk after taking it -significant itching  No vaginal d/c  No pain or cramping  Irritated   R inner thigh is itchy as well /almost tingling /burning  Also in ankle skin occasionally  No redness or skin color change and no swelling  This also started after starting the medicine  Not all the time /just on and off   Her blood glucose is improving some  She has also tried to change eating habits  130s-150s   Review of Systems  Constitutional: Negative for chills, diaphoresis, fever, malaise/fatigue and weight loss.  HENT: Negative for sore throat.   Eyes: Negative for blurred vision.  Respiratory: Negative for cough and shortness of breath.   Gastrointestinal: Negative for nausea and vomiting.  Genitourinary: Negative for dysuria.       Vaginal itching   Skin: Positive for itching. Negative for rash.  Endo/Heme/Allergies: Negative for polydipsia.      Patient Active Problem List   Diagnosis Date Noted  . Yeast vaginitis 10/04/2018  . Numbness and tingling sensation of skin 10/04/2018  . Neck pain 08/18/2017  . Productive cough 08/18/2017  . Exposure to communicable disease 01/19/2016  . Morbid obesity ([place]) 07/07/2015  . Essential hypertension 07/05/2015  . Hyperlipidemia 08/31/2014  . Diabetes mellitus type 2, controlled, without complications ([place]) 26/71/2458  . Leg cramps  02/06/2014  . Dry mouth 02/06/2014  . History of shingles 09/18/2013  . Carpal tunnel syndrome 05/31/2013  . Routine general medical examination at a health care facility 07/04/2012  . HEARING LOSS, RIGHT EAR 07/26/2008  . ALLERGIC RHINITIS 06/28/2008   Past Medical History:  Diagnosis Date  . Diabetes mellitus without complication ([place])   . Hypertension    Past Surgical History:  Procedure Laterality Date  . CARPAL TUNNEL RELEASE Right 04/24/14  . CESAREAN SECTION    . TUBAL LIGATION     Social History   Tobacco Use  . Smoking status: Never Smoker  . Smokeless tobacco: Never Used  Substance Use Topics  . Alcohol use: Yes    Alcohol/week: 0.0 standard drinks    Comment: Rare  . Drug use: No   Family History  Problem Relation Age of Onset  . Hypertension Mother   . Diabetes Mother   . Heart failure Mother   . Hypertension Father   . Diabetes Sister   . Kidney failure Brother    Allergies  Allergen Reactions  . Metformin And Related Other (See Comments)    The XR caused sore throat, HA, GI issues   Current Outpatient Medications on File Prior to Visit  Medication Sig Dispense Refill  . Blood Glucose Monitoring Suppl (ONE TOUCH ULTRA 2) w/Device KIT 1 Device by Does not apply route as directed. 1 each 0  . BYDUREON 2 MG SRER Please specify directions, refills and quantity (Patient not taking: Reported on 09/12/2018) 2 each 0  . empagliflozin (  JARDIANCE) 10 MG TABS tablet Take 10 mg by mouth daily. 30 tablet 1  . glipiZIDE (GLUCOTROL XL) 10 MG 24 hr tablet Take 1 tablet (10 mg total) by mouth 2 (two) times daily with a meal. 60 tablet 6  . glucose blood (ONE TOUCH ULTRA TEST) test strip TEST SUGAR TWICE A DAY(dx. E11.9) 100 each 3  . hydrochlorothiazide (HYDRODIURIL) 25 MG tablet Take 1 tablet (25 mg total) by mouth every other day. 15 tablet 5  . Lancets (ONETOUCH ULTRASOFT) lancets Twice daily 100 each 12  . naproxen sodium (ALEVE) 220 MG tablet Take 440 mg by mouth 2  (two) times daily as needed. Reported on 07/05/2015     No current facility-administered medications on file prior to visit.     Observations/Objective: Patient appears well, in no distress Weight is baseline (obese) No facial swelling or asymmetry Normal voice-not hoarse and no slurred speech No obvious tremor or mobility impairment Moving neck and UEs normally Able to hear the call well  No cough or shortness of breath during interview  Talkative and mentally sharp with no cognitive changes No skin changes on face or neck , no rash or pallor Affect is normal    Assessment and Plan: Problem List Items Addressed This Visit      Genitourinary   Yeast vaginitis - Primary    Virtual visit-could not do exam or wet prep but symptoms sound like yeast (in diabetic who has started Jardiance which has this side effect)  Px diflucan 150 mg now and repeat in 3 d  Also terazol cream to use externally as needed  Update if not starting to improve in a week or if worsening   Disc yogurt intake-may help prevent  If this becomes recurrent-may need to change medication       Relevant Medications   fluconazole (DIFLUCAN) 150 MG tablet     Other   Numbness and tingling sensation of skin    Per pt-ankle and outer leg without redness or other skin change No symptoms of neuropathy in feet or toes Asked her to watch for a rash or redness (esp blisters which could indicate shingles) If this is yeast-the diflucan may help  Alert [place] and endocrinology if this persists           Follow Up Instructions: The Jardiance will increase your risk of vaginal and skin yeast infection  I am unsure if area on your leg could also be from yeast  Take the diflucan as directed one pill today and one in 3 days Also use terazol cream on itchy areas daily  Update if not starting to improve in a week or if worsening    Eating yogurt may help prevent yeast  Also avoid sugar if you can  Continue follow up with  your diabetes doctor  You may have to change the Jardiance if yeast infections become frequent    I discussed the assessment and treatment plan with the patient. The patient was provided an opportunity to ask questions and all were answered. The patient agreed with the plan and demonstrated an understanding of the instructions.   The patient was advised to call back or seek an in-person evaluation if the symptoms worsen or if the condition fails to improve as anticipated.     Loura Pardon, MD

## 2018-10-06 ENCOUNTER — Telehealth: Payer: Self-pay | Admitting: Family Medicine

## 2018-10-06 MED ORDER — FLUCONAZOLE 150 MG PO TABS: 150.0000 mg | ORAL_TABLET | Freq: Once | ORAL | 0 refills | Status: AC

## 2018-10-06 NOTE — Telephone Encounter (Signed)
She is right-they just gave her one  I sent in the 2nd tablet

## 2018-10-06 NOTE — Telephone Encounter (Signed)
Patient called today in regards to a medication that she was prescribed at her virtual Visit on Tuesday for a yeast infection.   The instructions she was given was that there will be 2 tablets. Take the first tablet then 3 days later take the next tablet.  When she picked up the prescription from the pharmacy there was only one tablet attached and that she did not received a second tablet   She stated that on the prescription it gives the instructions for two tablets.   Patient would like clarification on the medication she was prescribed.   BEST PHONE - [phone]

## 2018-10-06 NOTE — Telephone Encounter (Signed)
Pt notified of the error and that the other does was sent in now

## 2018-10-11 ENCOUNTER — Telehealth: Payer: Self-pay | Admitting: Family Medicine

## 2018-10-11 NOTE — Telephone Encounter (Signed)
Left VM letting pt know immunization report is ready for pick up, and when she picks up imm. we ask that she have no sxs of covid-19 before entering in the building and also if she has a mask to wear it to pick up immunization report

## 2018-10-11 NOTE — Telephone Encounter (Signed)
Patient stated that she would like to see if she could receive a list of her immunizations that she has had.   Patient would like a call once this is ready for pick up  BEST PHONE- [phone]

## 2018-10-25 ENCOUNTER — Telehealth: Payer: Self-pay | Admitting: *Deleted

## 2018-10-25 MED ORDER — FLUCONAZOLE 150 MG PO TABS: 150.0000 mg | ORAL_TABLET | Freq: Once | ORAL | 1 refills | Status: AC

## 2018-10-25 NOTE — Telephone Encounter (Signed)
Patient called stating that she was switched to a new diabetic medication and has been getting yeast infections since starting it. Patient stated that she was recently treated for a yeast infection. Patient stated that she feels that she has another yeast infection because she is itching again Patient wants to know if you will prescribe medication again for her symptoms?

## 2018-10-25 NOTE — Telephone Encounter (Signed)
I sent [facility]lucan to her [facility] -I hope it helps and put a refill on it   If yeast infections continue to be frequent she should discuss it with endocrinology  Both diabetes itself as well as some diabetes medicines can increase risk of vaginal yeast infections  (risk may be better if diabetes is well controlled)   An over the counter probiotic or yogurt may help prevent them also

## 2018-10-26 NOTE — Telephone Encounter (Signed)
Pt notified Rx sent to pharmacy and I advised pt of all of Dr. Marliss Coots instructions and recommendations and pt verbalized understanding

## 2018-10-31 ENCOUNTER — Telehealth: Payer: Self-pay | Admitting: Internal Medicine

## 2018-10-31 NOTE — Telephone Encounter (Signed)
Please advise 

## 2018-10-31 NOTE — Telephone Encounter (Signed)
Patient stated that she has been on empagliflozin (JARDIANCE) 10 MG TABS tablet for maybe 3 months now and she has had two yeast infections.  Please Advise, Thanks

## 2018-11-01 NOTE — Telephone Encounter (Signed)
Pt informed

## 2018-11-07 ENCOUNTER — Ambulatory Visit: Payer: 59 | Admitting: Internal Medicine

## 2018-11-17 ENCOUNTER — Other Ambulatory Visit: Payer: Self-pay

## 2018-11-21 ENCOUNTER — Ambulatory Visit: Payer: 59 | Admitting: Internal Medicine

## 2018-11-21 ENCOUNTER — Other Ambulatory Visit: Payer: Self-pay

## 2018-11-21 VITALS — BP 148/88 | HR 81 | Temp 97.9°F | Ht 65.0 in | Wt 247.2 lb

## 2018-11-21 DIAGNOSIS — E1165 Type 2 diabetes mellitus with hyperglycemia: Secondary | ICD-10-CM | POA: Diagnosis not present

## 2018-11-21 DIAGNOSIS — E785 Hyperlipidemia, unspecified: Secondary | ICD-10-CM | POA: Diagnosis not present

## 2018-11-21 DIAGNOSIS — M79604 Pain in right leg: Secondary | ICD-10-CM | POA: Diagnosis not present

## 2018-11-21 DIAGNOSIS — E559 Vitamin D deficiency, unspecified: Secondary | ICD-10-CM

## 2018-11-21 LAB — LIPID PANEL
Cholesterol: 236 mg/dL — ABNORMAL HIGH (ref 0–200)
HDL: 58.2 mg/dL (ref >39.00)
LDL Cholesterol: 160 mg/dL — ABNORMAL HIGH (ref 0–99)
NonHDL: 177.5
Total CHOL/HDL Ratio: 4
Triglycerides: 90 mg/dL (ref 0.0–149.0)
VLDL: 18 mg/dL (ref 0.0–40.0)

## 2018-11-21 LAB — BASIC METABOLIC PANEL
BUN: 12 mg/dL (ref 6–23)
CO2: 25 mEq/L (ref 19–32)
Calcium: 9.2 mg/dL (ref 8.4–10.5)
Chloride: 105 mEq/L (ref 96–112)
Creatinine, Ser: 0.6 mg/dL (ref 0.40–1.20)
GFR: 125.68 mL/min (ref >60.00)
Glucose, Bld: 193 mg/dL — ABNORMAL HIGH (ref 70–99)
Potassium: 3.9 mEq/L (ref 3.5–5.1)
Sodium: 138 mEq/L (ref 135–145)

## 2018-11-21 LAB — POCT GLYCOSYLATED HEMOGLOBIN (HGB A1C): Hemoglobin A1C: 7.4 % — AB (ref 4.0–5.6)

## 2018-11-21 LAB — VITAMIN D 25 HYDROXY (VIT D DEFICIENCY, FRACTURES): VITD: 10.58 ng/mL — ABNORMAL LOW (ref 30.00–100.00)

## 2018-11-21 NOTE — Patient Instructions (Signed)
-   Continue  Glipizide 10 mg XL, 1 tablet with Breakfast and Supper - Take it before the meal and not after    - Check sugar twice a day (fasting and bedtime)  - Choose healthy, lower carb lower calorie snacks: toss salad, cooked vegetables, cottage cheese, peanut butter, low fat cheese / string cheese, lower sodium deli meat, tuna salad or chicken salad

## 2018-11-21 NOTE — Progress Notes (Signed)
Name: REJINA ODLE  Age/ Sex: 53 y.o., female   MRN/ DOB: [id number], [birth year]     PCP: Abner Greenspan, MD   Reason for Endocrinology Evaluation: Type 2 Diabetes Mellitus     Initial Endocrinology Clinic Visit: 08/01/2018      PATIENT IDENTIFIER: Ms. KIMIYAH BLICK is a 53 y.o. female with a past medical history of HTN and T2DM. The patient has followed with Endocrinology clinic since 08/01/2018 for consultative assistance with management of her diabetes.  DIABETIC HISTORY:  Ms. Kring was diagnosed with T2DM in 2015. She reports intolerance to Metformin (Throat chemical burn ) . Her hemoglobin A1c has ranged from 7.3% in 2017, peaking at 10.0% in 2020.  ON her initial visit to our clinic her A1c was 10.0 % and was on Glipizide.   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  Today (11/21/2018): Ms. Koltz is here for a 3 month  follow up on diabetes management. She checks her blood sugars 2 times daily, preprandial to breakfast and supper. The patient has not had hypoglycemic episodes since the last clinic visit. Otherwise, the patient has not required any recent emergency interventions for hypoglycemia and has not had recent hospitalizations secondary to hyper or hypoglycemic episodes.  She had 2 UTI while on Jardiance so this was discontinued.    Today she is c/o right leg pain, around the shin area, has been there for months , intermittent , no triggering or exacerbating factors. No tingling or numbness.     ROS: As per HPI and as detailed below: Review of Systems  Constitutional: Negative for fever.  Respiratory: Negative for cough.   Cardiovascular: Negative for chest pain and palpitations.  Gastrointestinal: Negative for diarrhea and nausea.  Genitourinary: Negative for frequency.   Endo/Heme/Allergies: Negative for polydipsia.      HOME DIABETES REGIMEN:  Glipizide XL 10 mg BID       GLUCOSE LOG : Date Breakfast  Bedtime  11/21/18 185   09/20/18 185 146  7/4 202 168  7/3 153 175      HISTORY:  Past Medical History:  Past Medical History:  Diagnosis Date  . Diabetes mellitus without complication ([place])   . Hypertension    Past Surgical History:  Past Surgical History:  Procedure Laterality Date  . CARPAL TUNNEL RELEASE Right 04/24/14  . CESAREAN SECTION    . TUBAL LIGATION      Social History:  reports that she has never smoked. She has never used smokeless tobacco. She reports current alcohol use. She reports that she does not use drugs. Family History:  Family History  Problem Relation Age of Onset  . Hypertension Mother   . Diabetes Mother   . Heart failure Mother   . Hypertension Father   . Diabetes Sister   . Kidney failure Brother      HOME MEDICATIONS: Allergies as of 11/21/2018      Reactions   Metformin And Related Other (See Comments)   The XR caused sore throat, HA, GI issues   Jardiance [empagliflozin] Other (See Comments)   Yeast infections      Medication List       Accurate as of November 21, 2018  3:58 PM. If you have any questions, ask your nurse or doctor.  STOP taking these medications   Bydureon 2 MG Srer Generic drug: Exenatide ER Stopped by: Dorita Sciara, MD   empagliflozin 10 MG Tabs tablet Commonly known as: Jardiance Stopped by: Dorita Sciara, MD     TAKE these medications   Aleve 220 MG tablet Generic drug: naproxen sodium Take 440 mg by mouth 2 (two) times daily as needed. Reported on 07/05/2015   glipiZIDE 10 MG 24 hr tablet Commonly known as: GLUCOTROL XL Take 1 tablet (10 mg total) by mouth 2 (two) times daily with a meal.   glucose blood test strip Commonly known as: ONE TOUCH ULTRA TEST TEST SUGAR TWICE A DAY(dx. E11.9)   hydrochlorothiazide 25 MG tablet Commonly  known as: HYDRODIURIL Take 1 tablet (25 mg total) by mouth every other day.   ONE TOUCH ULTRA 2 w/Device Kit 1 Device by Does not apply route as directed.   onetouch ultrasoft lancets Twice daily   terconazole 0.8 % vaginal cream Commonly known as: Terazol 3 Apply to affected (yeast, itching) areas once daily as needed       VITAL SIGNS: BP (!) 162/98 (BP Location: Right Arm, Patient Position: Sitting, Cuff Size: Small)   Pulse 80   Ht 5' 5"  (1.651 m)   Wt 246 lb (111.6 kg)   SpO2 98%   BMI 40.94 kg/m    PHYSICAL EXAM:  General: Pt appears well and is in NAD  Neck: General: Supple without adenopathy or carotid bruits. Thyroid: Thyroid size normal.  No goiter or nodules appreciated. No thyroid bruit.  Lungs: Clear with good BS bilat with no rales, rhonchi, or wheezes  Heart: RRR with normal S1 and S2 and no gallops; no murmurs; no rub  Abdomen: Normoactive bowel sounds, soft, nontender, without masses or organomegaly palpable  Extremities:  Lower extremities - No pretibial edema. No lesions.  Skin: Normal texture and temperature to palpation. No rash noted.   Neuro: MS is good with appropriate affect, pt is alert and Ox3    DM foot exam: 11/21/2018 The skin of the feet is intact without sores or ulcerations. The pedal pulses are 2+ on right and 2+ on left. The sensation is intact to a screening 5.07, 10 gram monofilament bilaterally    DATA REVIEWED:  Lab Results  Component Value Date   HGBA1C 7.4 (A) 11/21/2018   HGBA1C 10.0 (H) 05/20/2018   HGBA1C 9.8 (H) 02/10/2018   Lab Results  Component Value Date   MICROALBUR <0.7 04/24/2016   LDLCALC 142 (H) 02/10/2018   CREATININE 0.60 05/20/2018  Results for LIANY, MUMPOWER (MRN [id number]) as of 11/22/2018 12:33  Ref. Range 11/21/2018 12:01  Sodium Latest Ref Range: 135 - 145 mEq/L 138  Potassium Latest Ref Range: 3.5 - 5.1 mEq/L 3.9  Chloride Latest Ref Range: 96 - 112 mEq/L 105  CO2 Latest Ref Range: 19 - 32  mEq/L 25  Glucose Latest Ref Range: 70 - 99 mg/dL 193 (H)  BUN Latest Ref Range: 6 - 23 mg/dL 12  Creatinine Latest Ref Range: 0.40 - 1.20 mg/dL 0.60  Calcium Latest Ref Range: 8.4 - 10.5 mg/dL 9.2  GFR Latest Ref Range: >60.00 mL/min 125.68  Total CHOL/HDL Ratio Unknown 4  Cholesterol Latest Ref Range: 0 - 200 mg/dL 236 (H)  HDL Cholesterol Latest Ref Range: >39.00 mg/dL 58.20  LDL (calc) Latest Ref Range: 0 - 99 mg/dL 160 (H)  NonHDL Unknown 177.50  Triglycerides Latest Ref Range: 0.0 - 149.0 mg/dL 90.0  VLDL Latest  Ref Range: 0.0 - 40.0 mg/dL 18.0  VITD Latest Ref Range: 30.00 - 100.00 ng/mL 10.58 (L)    ASSESSMENT / PLAN / RECOMMENDATIONS:   1) Type 2 Diabetes Mellitus, Poorly controlled, Without complications - Most recent A1c of 7.4 %. Goal A1c < 7.0 %. Down from 10.0%    Plan:  - Praised the patient on near-optimal glucose control - I have encouraged her to continue with lifestyle changes and avoid sugar -sweetened beverages - She was also encouraged to exercise ~ 150 min/week  - She is intolerant to metformin, Rybelsus and Jardiance - She was advised to take Glipizide prior to meals and not after.   MEDICATIONS:  Continue Glipizide XL 10 mg BID    EDUCATION / INSTRUCTIONS:  BG monitoring instructions: Patient is instructed to check her blood sugars 2 times a day, fasting and supper.  Call [facility] if: BG persistently < 70 or > 300. . I reviewed the Rule of 15 for the treatment of hypoglycemia in detail with the patient. Literature supplied.  2) Diabetic complications:   Eye: Does not have known diabetic retinopathy.   Neuro/ Feet: Does not have known diabetic peripheral neuropathy.  Renal: Patient does not have known baseline CKD. She is not on an ACEI/ARB at present.   3) Lipids: Patient is not on a statin.  Her 10-year ASCVD risk is at 26.7% , patient would benefit from a high intensity statin such as rosuvastatin 20 mg daily, but given  her low vitamin d and her sensitivity to medications , will start with a small dose for now   4) Leg pain :   - Reassurance provided this is not neuropathy, not sure if she is dehydrated vs musculoskeletal, will check vitamin D levels today   5) Vitamin D Deficiency :   - Will replenish with ergocalciferol 50,000 iu weekly for 3 months, she was advised to switch to D3 1000 iu daily after completion.    F/U in 3 months   Addendum: lab results discussed with pt on 7/7/ at noon.   Signed electronically by: Mack Guise, MD  [facility]  [facility] [address] [place], [place] [address] Phone: [phone] FAX: [phone]   CC: Tower, Wynelle Fanny, MD [place] [address] Phone: [phone]  Fax: [phone]  Return to Endocrinology clinic as below: No future appointments.

## 2018-11-22 ENCOUNTER — Encounter: Payer: Self-pay | Admitting: Internal Medicine

## 2018-11-22 DIAGNOSIS — E559 Vitamin D deficiency, unspecified: Secondary | ICD-10-CM | POA: Insufficient documentation

## 2018-11-22 DIAGNOSIS — M79604 Pain in right leg: Secondary | ICD-10-CM | POA: Insufficient documentation

## 2018-11-22 DIAGNOSIS — E785 Hyperlipidemia, unspecified: Secondary | ICD-10-CM | POA: Insufficient documentation

## 2018-11-22 MED ORDER — ERGOCALCIFEROL 1.25 MG (50000 UT) PO CAPS: 50000.0000 [IU] | ORAL_CAPSULE | ORAL | 0 refills | Status: DC

## 2018-11-22 MED ORDER — ROSUVASTATIN CALCIUM 5 MG PO TABS: 5.0000 mg | ORAL_TABLET | Freq: Every day | ORAL | 6 refills | Status: DC

## 2019-02-08 ENCOUNTER — Encounter: Payer: Self-pay | Admitting: Gynecology

## 2019-02-14 ENCOUNTER — Other Ambulatory Visit: Payer: Self-pay | Admitting: Internal Medicine

## 2019-04-17 ENCOUNTER — Other Ambulatory Visit: Payer: Self-pay

## 2019-04-17 ENCOUNTER — Telehealth: Payer: Self-pay | Admitting: Family Medicine

## 2019-04-17 DIAGNOSIS — U071 COVID-19: Secondary | ICD-10-CM

## 2019-04-17 DIAGNOSIS — Z20822 Contact with and (suspected) exposure to covid-19: Secondary | ICD-10-CM

## 2019-04-17 DIAGNOSIS — I10 Essential (primary) hypertension: Secondary | ICD-10-CM

## 2019-04-17 DIAGNOSIS — E785 Hyperlipidemia, unspecified: Secondary | ICD-10-CM

## 2019-04-17 DIAGNOSIS — E1169 Type 2 diabetes mellitus with other specified complication: Secondary | ICD-10-CM

## 2019-04-17 DIAGNOSIS — E559 Vitamin D deficiency, unspecified: Secondary | ICD-10-CM

## 2019-04-17 DIAGNOSIS — E1165 Type 2 diabetes mellitus with hyperglycemia: Secondary | ICD-10-CM

## 2019-04-17 HISTORY — DX: COVID-19: U07.1

## 2019-04-17 NOTE — Telephone Encounter (Signed)
-----   Message from Cloyd Stagers, RT sent at 04/12/2019  3:13 PM EST ----- Regarding: Lab Orders for Friday 12.4.2020 Please place lab orders for Friday 12.4.2020, office visit for physical on Wednesday 12.9.2020 Thank you, Dyke Maes RT(R)

## 2019-04-19 LAB — NOVEL CORONAVIRUS, NAA: SARS-CoV-2, NAA: DETECTED — AB

## 2019-04-20 ENCOUNTER — Telehealth: Payer: Self-pay | Admitting: Nurse Practitioner

## 2019-04-20 NOTE — Telephone Encounter (Signed)
Called to Discuss with patient about Covid symptoms and the use of bamlanivimab, a monoclonal antibody infusion for those with mild to moderate Covid symptoms and at a high risk of hospitalization.     Pt is qualified for this infusion at the [facility] infusion center due to co-morbid conditions and/or a member of an at-risk group.    Patient is currently being managed for the following problems:  Patient Active Problem List   Diagnosis Date Noted  . Vitamin D deficiency 11/22/2018  . Leg pain, anterior, right 11/22/2018  . Dyslipidemia 11/22/2018  . Yeast vaginitis 10/04/2018  . Numbness and tingling sensation of skin 10/04/2018  . Neck pain 08/18/2017  . Productive cough 08/18/2017  . Exposure to communicable disease 01/19/2016  . Morbid obesity ([place]) 07/07/2015  . Essential hypertension 07/05/2015  . Hyperlipidemia associated with type 2 diabetes mellitus ([place]) 08/31/2014  . Type 2 diabetes mellitus with hyperglycemia, without long-term current use of insulin ([place]) 02/15/2014  . Leg cramps 02/06/2014  . Dry mouth 02/06/2014  . History of shingles 09/18/2013  . Carpal tunnel syndrome 05/31/2013  . Routine general medical examination at a health care facility 07/04/2012  . HEARING LOSS, RIGHT EAR 07/26/2008  . ALLERGIC RHINITIS 06/28/2008    Patient declines treatment at this time.

## 2019-04-21 ENCOUNTER — Other Ambulatory Visit: Payer: 59

## 2019-04-21 ENCOUNTER — Other Ambulatory Visit: Payer: Self-pay | Admitting: Family Medicine

## 2019-04-21 DIAGNOSIS — Z1231 Encounter for screening mammogram for malignant neoplasm of breast: Secondary | ICD-10-CM

## 2019-04-26 ENCOUNTER — Encounter: Payer: 59 | Admitting: Family Medicine

## 2019-05-01 ENCOUNTER — Telehealth: Payer: Self-pay

## 2019-05-01 MED ORDER — PROMETHAZINE-DM 6.25-15 MG/5ML PO SYRP: 5.0000 mL | ORAL_SOLUTION | Freq: Four times a day (QID) | ORAL | 0 refills | Status: DC | PRN

## 2019-05-01 NOTE — Telephone Encounter (Signed)
If she is still coughing- will have to re schedule it  It may take a while for symptoms to totally abate

## 2019-05-01 NOTE — Telephone Encounter (Signed)
Pt notified of DR. Tower's comments and verbalized understanding. Just a FYI given sxs pt does have a lab appt scheduled this Friday and a CPE scheduled next week

## 2019-05-01 NOTE — Telephone Encounter (Signed)
See prev message. Please r/s appts (lab and CPE)

## 2019-05-01 NOTE — Telephone Encounter (Signed)
I sent prometh DM to her pharmacy Alert it can sedate a bit  Use caution  Keep [place] updated   Don't hesitate to schedule virtual visit if needed

## 2019-05-01 NOTE — Telephone Encounter (Signed)
Patient was tested positive for COVID on 04/17/2019 (out of work since 04/14/2019). Patient has a cough still-coughing up yellowish color phlegm (phlegm was white first). Has been coughing for 2 weeks now. Had productive cough from day one. Has used diabetic Robitussin and taking Tylenol, using saline rinse (she was told by occupational health not to take anything else and to let it run its course per patient). Coughs at night per her husband. She is worried with the cough that she would not be able to tolerate wearing mask at work without controlling the cough. Health department/occupational health representative that has been following patient advised to ask PCP for cough medication. No fever. No SOB. No post nasal drip. Cough is getting aggravated. No other symptoms. Please review.

## 2019-05-02 NOTE — Telephone Encounter (Signed)
Patient said she had to have her paperwork for school filled out by the end of the year.  I rescheduled her appointment to 05/15/19 and she'll have her labs done the same day.  I let her know if she's still coughing, she'll have to reschedule appointment.

## 2019-05-05 ENCOUNTER — Other Ambulatory Visit: Payer: 59

## 2019-05-08 ENCOUNTER — Ambulatory Visit (INDEPENDENT_AMBULATORY_CARE_PROVIDER_SITE_OTHER): Payer: 59 | Admitting: Family Medicine

## 2019-05-08 ENCOUNTER — Encounter: Payer: Self-pay | Admitting: Family Medicine

## 2019-05-08 VITALS — HR 92

## 2019-05-08 DIAGNOSIS — U071 COVID-19: Secondary | ICD-10-CM | POA: Insufficient documentation

## 2019-05-08 DIAGNOSIS — R058 Other specified cough: Secondary | ICD-10-CM

## 2019-05-08 DIAGNOSIS — R05 Cough: Secondary | ICD-10-CM

## 2019-05-08 MED ORDER — HYDROCODONE-HOMATROPINE 5-1.5 MG/5ML PO SYRP: 5.0000 mL | ORAL_SOLUTION | Freq: Three times a day (TID) | ORAL | 0 refills | Status: DC | PRN

## 2019-05-08 MED ORDER — BENZONATATE 200 MG PO CAPS: 200.0000 mg | ORAL_CAPSULE | Freq: Three times a day (TID) | ORAL | 1 refills | Status: DC | PRN

## 2019-05-08 NOTE — Assessment & Plan Note (Signed)
This time from covid 19  Dx 04/17/19  Productive cough has been her only symptom Now she may have some degree of cyclic cough No signs of bacterial infx Sent in tessalon 200 mg to try tid  Also hycodan tid prn with caution of sedation (warned her)  Re eval 12/28 Will plan on filling out FMLA paperwork as well

## 2019-05-08 NOTE — Progress Notes (Signed)
Virtual Visit via Video Note  I connected with Krista Newton on 05/08/19 at  3:00 PM EST by a video enabled telemedicine application and verified that I am speaking with the correct person using two identifiers.  Location: Patient: home Provider: office    I discussed the limitations of evaluation and management by telemedicine and the availability of in person appointments. The patient expressed understanding and agreed to proceed.  Parties involved in encounter  Patient: Krista Newton   Provider:  Loura Pardon MD    History of Present Illness: Pt presents with positive covid 19 diagnosis with cough and nasal congestion  She has positive covid test on 04/17/19   Pulse ox is 98% today   We sent in promethazine dm for cough on 12/15   Cough continues  Her cough is productive - pale yellow  Sinus drainage is thick and clumpy and white  No wheezing or sob    No fever or other symptoms  Never had a fever  No GI symptoms   Has to return to work - she is still coughing  She has to wear a mask at work   The 30th was the first official day of missed work   Patient Active Problem List   Diagnosis Date Noted  . COVID-19 virus infection 05/08/2019  . Vitamin D deficiency 11/22/2018  . Leg pain, anterior, right 11/22/2018  . Dyslipidemia 11/22/2018  . Yeast vaginitis 10/04/2018  . Numbness and tingling sensation of skin 10/04/2018  . Neck pain 08/18/2017  . Productive cough 08/18/2017  . Exposure to communicable disease 01/19/2016  . Morbid obesity ([place]) 07/07/2015  . Essential hypertension 07/05/2015  . Hyperlipidemia associated with type 2 diabetes mellitus ([place]) 08/31/2014  . Type 2 diabetes mellitus with hyperglycemia, without long-term current use of insulin ([place]) 02/15/2014  . Leg cramps 02/06/2014  . Dry mouth 02/06/2014  . History of shingles 09/18/2013  . Carpal tunnel syndrome 05/31/2013  . Routine general medical examination at a health care facility  07/04/2012  . HEARING LOSS, RIGHT EAR 07/26/2008  . ALLERGIC RHINITIS 06/28/2008   Past Medical History:  Diagnosis Date  . Diabetes mellitus without complication ([place])   . Hypertension    Past Surgical History:  Procedure Laterality Date  . CARPAL TUNNEL RELEASE Right 04/24/14  . CESAREAN SECTION    . TUBAL LIGATION     Social History   Tobacco Use  . Smoking status: Never Smoker  . Smokeless tobacco: Never Used  Substance Use Topics  . Alcohol use: Yes    Alcohol/week: 0.0 standard drinks    Comment: Rare  . Drug use: No   Family History  Problem Relation Age of Onset  . Hypertension Mother   . Diabetes Mother   . Heart failure Mother   . Hypertension Father   . Diabetes Sister   . Kidney failure Brother    Allergies  Allergen Reactions  . Metformin And Related Other (See Comments)    The XR caused sore throat, HA, GI issues  . Jardiance [Empagliflozin] Other (See Comments)    Yeast infections   Current Outpatient Medications on File Prior to Visit  Medication Sig Dispense Refill  . Blood Glucose Monitoring Suppl (ONE TOUCH ULTRA 2) w/Device KIT 1 Device by Does not apply route as directed. 1 each 0  . glipiZIDE (GLUCOTROL XL) 10 MG 24 hr tablet Take 1 tablet (10 mg total) by mouth 2 (two) times daily with a meal. 60 tablet 6  .  glucose blood (ONE TOUCH ULTRA TEST) test strip TEST SUGAR TWICE A DAY(dx. E11.9) 100 each 3  . hydrochlorothiazide (HYDRODIURIL) 25 MG tablet Take 1 tablet (25 mg total) by mouth every other day. 15 tablet 5  . Lancets (ONETOUCH ULTRASOFT) lancets Twice daily 100 each 12  . promethazine-dextromethorphan (PROMETHAZINE-DM) 6.25-15 MG/5ML syrup Take 5 mLs by mouth 4 (four) times daily as needed for cough. 118 mL 0  . rosuvastatin (CRESTOR) 5 MG tablet Take 1 tablet (5 mg total) by mouth daily. 30 tablet 6  . Vitamin D, Ergocalciferol, (DRISDOL) 1.25 MG (50000 UT) CAPS capsule TAKE 1 CAPSULE BY MOUTH ONE TIME PER WEEK 4 capsule 3   No  current facility-administered medications on file prior to visit.   Review of Systems  Constitutional: Negative for chills, fever and malaise/fatigue.  HENT: Negative for congestion, ear pain, sinus pain and sore throat.   Eyes: Negative for blurred vision, discharge and redness.  Respiratory: Positive for cough and sputum production. Negative for hemoptysis, shortness of breath, wheezing and stridor.   Cardiovascular: Negative for chest pain, palpitations and leg swelling.  Gastrointestinal: Negative for abdominal pain, diarrhea, nausea and vomiting.  Musculoskeletal: Negative for myalgias.  Skin: Negative for rash.  Neurological: Negative for dizziness and headaches.      Observations/Objective: Patient appears well, in no distress Weight is baseline  No facial swelling or asymmetry Normal voice-not hoarse and no slurred speech No obvious tremor or mobility impairment Moving neck and UEs normally Able to hear the call well  Occasional junky sounding cough, no shortness of breath or wheezing noted during the interview  Talkative and mentally sharp with no cognitive changes No skin changes on face or neck , no rash or pallor Affect is normal    Assessment and Plan: Problem List Items Addressed This Visit      Other   Productive cough - Primary    This time from covid 19  Dx 04/17/19  Productive cough has been her only symptom Now she may have some degree of cyclic cough No signs of bacterial infx Sent in tessalon 200 mg to try tid  Also hycodan tid prn with caution of sedation (warned her)  Re eval 12/28 Will plan on filling out FMLA paperwork as well      COVID-19 virus infection    Pt tested positive on 04/17/19 She has had productive cough the whole time -no other symptoms fortunately  She is having a hard time with the cough  Will plan on sending FMLA paperwork- and would aim for a return to work date of 12/28 if we can get cough under control with medication            Follow Up Instructions: Try the tessalon and hycodan (caution of sedation with hycodan) to see if we can stop this cyclic cough  If cough worsens or you develop wheezing/ chest tightness or shortness of breath let us know  Also if fever or any new symptoms   If we can get cough under control we can aim for 12/28 for return to work  Please send me your FMLA paperwork    I discussed the assessment and treatment plan with the patient. The patient was provided an opportunity to ask questions and all were answered. The patient agreed with the plan and demonstrated an understanding of the instructions.   The patient was advised to call back or seek an in-person evaluation if the symptoms worsen or if the condition fails  to improve as anticipated.     Loura Pardon, MD

## 2019-05-08 NOTE — Patient Instructions (Signed)
Try the tessalon and hycodan (caution of sedation with hycodan) to see if we can stop this cyclic cough  If cough worsens or you develop wheezing/ chest tightness or shortness of breath let us know  Also if fever or any new symptoms   If we can get cough under control we can aim for 12/28 for return to work  Please send me your FMLA paperwork

## 2019-05-08 NOTE — Assessment & Plan Note (Signed)
Pt tested positive on 04/17/19 She has had productive cough the whole time -no other symptoms fortunately  She is having a hard time with the cough  Will plan on sending FMLA paperwork- and would aim for a return to work date of 12/28 if we can get cough under control with medication

## 2019-05-09 ENCOUNTER — Encounter: Payer: 59 | Admitting: Family Medicine

## 2019-05-10 ENCOUNTER — Encounter: Payer: 59 | Admitting: Family Medicine

## 2019-05-10 ENCOUNTER — Telehealth: Payer: Self-pay | Admitting: Family Medicine

## 2019-05-10 NOTE — Telephone Encounter (Signed)
She is long past the isolation time line for covid- but if still coughing a lot will have to re schedule.  If cough is significantly improved it is ok

## 2019-05-10 NOTE — Telephone Encounter (Signed)
FMLA paperwork in Dr tower's in box

## 2019-05-10 NOTE — Telephone Encounter (Signed)
See prev note given sxs are still there, please advise  FYI when [facility] 1st r/s appt pt stated she needed CPE done before end of year but given sxs is she able to still come, please advise

## 2019-05-10 NOTE — Telephone Encounter (Signed)
im filling out FMLA for pt  At office visit on 12/21 pt stated she tested positive for covid 11/30 office visit stated cough/nasal congestion.  She has physical scheduled  12/28 is it ok for pt to come in for appointment or do you want it rescheduled.  If reschedule how far out do you want this schedule??

## 2019-05-10 NOTE — Telephone Encounter (Signed)
Signed and in IN box 

## 2019-05-11 NOTE — Telephone Encounter (Signed)
Pt aware of dr towers comments

## 2019-05-11 NOTE — Telephone Encounter (Signed)
Paperwork faxed Pt aware  

## 2019-05-15 ENCOUNTER — Encounter: Payer: Self-pay | Admitting: Family Medicine

## 2019-05-15 ENCOUNTER — Ambulatory Visit (INDEPENDENT_AMBULATORY_CARE_PROVIDER_SITE_OTHER): Payer: 59 | Admitting: Family Medicine

## 2019-05-15 ENCOUNTER — Other Ambulatory Visit: Payer: Self-pay

## 2019-05-15 VITALS — BP 134/80 | HR 85 | Temp 96.3°F | Ht 64.25 in | Wt 235.3 lb

## 2019-05-15 DIAGNOSIS — Z209 Contact with and (suspected) exposure to unspecified communicable disease: Secondary | ICD-10-CM

## 2019-05-15 DIAGNOSIS — E1169 Type 2 diabetes mellitus with other specified complication: Secondary | ICD-10-CM | POA: Diagnosis not present

## 2019-05-15 DIAGNOSIS — E1165 Type 2 diabetes mellitus with hyperglycemia: Secondary | ICD-10-CM

## 2019-05-15 DIAGNOSIS — I10 Essential (primary) hypertension: Secondary | ICD-10-CM

## 2019-05-15 DIAGNOSIS — Z1211 Encounter for screening for malignant neoplasm of colon: Secondary | ICD-10-CM | POA: Insufficient documentation

## 2019-05-15 DIAGNOSIS — E559 Vitamin D deficiency, unspecified: Secondary | ICD-10-CM

## 2019-05-15 DIAGNOSIS — Z114 Encounter for screening for human immunodeficiency virus [HIV]: Secondary | ICD-10-CM | POA: Insufficient documentation

## 2019-05-15 DIAGNOSIS — E785 Hyperlipidemia, unspecified: Secondary | ICD-10-CM

## 2019-05-15 DIAGNOSIS — Z0000 Encounter for general adult medical examination without abnormal findings: Secondary | ICD-10-CM

## 2019-05-15 DIAGNOSIS — Z111 Encounter for screening for respiratory tuberculosis: Secondary | ICD-10-CM | POA: Insufficient documentation

## 2019-05-15 DIAGNOSIS — Z1159 Encounter for screening for other viral diseases: Secondary | ICD-10-CM | POA: Insufficient documentation

## 2019-05-15 LAB — CBC WITH DIFFERENTIAL/PLATELET
Basophils Absolute: 0 10*3/uL (ref 0.0–0.1)
Basophils Relative: 0.6 % (ref 0.0–3.0)
Eosinophils Absolute: 0.2 10*3/uL (ref 0.0–0.7)
Eosinophils Relative: 2.7 % (ref 0.0–5.0)
HCT: 38.1 % (ref 36.0–46.0)
Hemoglobin: 12.9 g/dL (ref 12.0–15.0)
Lymphocytes Relative: 36.5 % (ref 12.0–46.0)
Lymphs Abs: 2.4 10*3/uL (ref 0.7–4.0)
MCHC: 33.9 g/dL (ref 30.0–36.0)
MCV: 90.1 fl (ref 78.0–100.0)
Monocytes Absolute: 0.4 10*3/uL (ref 0.1–1.0)
Monocytes Relative: 5.8 % (ref 3.0–12.0)
Neutro Abs: 3.6 10*3/uL (ref 1.4–7.7)
Neutrophils Relative %: 54.4 % (ref 43.0–77.0)
Platelets: 247 10*3/uL (ref 150.0–400.0)
RBC: 4.23 Mil/uL (ref 3.87–5.11)
RDW: 13.3 % (ref 11.5–15.5)
WBC: 6.7 10*3/uL (ref 4.0–10.5)

## 2019-05-15 LAB — POC URINALSYSI DIPSTICK (AUTOMATED)
Bilirubin, UA: NEGATIVE
Blood, UA: NEGATIVE
Glucose, UA: NEGATIVE
Ketones, UA: NEGATIVE
Leukocytes, UA: NEGATIVE
Nitrite, UA: NEGATIVE
Protein, UA: NEGATIVE
Spec Grav, UA: >=1.03 — AB (ref 1.010–1.025)
Urobilinogen, UA: 0.2 E.U./dL
pH, UA: 6 (ref 5.0–8.0)

## 2019-05-15 LAB — COMPREHENSIVE METABOLIC PANEL
ALT: 12 U/L (ref 0–35)
AST: 14 U/L (ref 0–37)
Albumin: 4.1 g/dL (ref 3.5–5.2)
Alkaline Phosphatase: 65 U/L (ref 39–117)
BUN: 13 mg/dL (ref 6–23)
CO2: 25 mEq/L (ref 19–32)
Calcium: 9.3 mg/dL (ref 8.4–10.5)
Chloride: 106 mEq/L (ref 96–112)
Creatinine, Ser: 0.68 mg/dL (ref 0.40–1.20)
GFR: 108.58 mL/min (ref >60.00)
Glucose, Bld: 192 mg/dL — ABNORMAL HIGH (ref 70–99)
Potassium: 3.5 mEq/L (ref 3.5–5.1)
Sodium: 138 mEq/L (ref 135–145)
Total Bilirubin: 0.6 mg/dL (ref 0.2–1.2)
Total Protein: 7.4 g/dL (ref 6.0–8.3)

## 2019-05-15 LAB — LIPID PANEL
Cholesterol: 247 mg/dL — ABNORMAL HIGH (ref 0–200)
HDL: 47.1 mg/dL (ref >39.00)
LDL Cholesterol: 182 mg/dL — ABNORMAL HIGH (ref 0–99)
NonHDL: 199.59
Total CHOL/HDL Ratio: 5
Triglycerides: 89 mg/dL (ref 0.0–149.0)
VLDL: 17.8 mg/dL (ref 0.0–40.0)

## 2019-05-15 LAB — VITAMIN D 25 HYDROXY (VIT D DEFICIENCY, FRACTURES): VITD: 17.92 ng/mL — ABNORMAL LOW (ref 30.00–100.00)

## 2019-05-15 LAB — TSH: TSH: 1.97 u[IU]/mL (ref 0.35–4.50)

## 2019-05-15 NOTE — Assessment & Plan Note (Signed)
Quant gold TB test for upcoming work training

## 2019-05-15 NOTE — Assessment & Plan Note (Signed)
Some wt loss since last visit  Discussed how this problem influences overall health and the risks it imposes  Reviewed plan for weight loss with lower calorie diet (via better food choices and also portion control or program like weight watchers) and exercise building up to or more than 30 minutes 5 days per week including some aerobic activity   commended

## 2019-05-15 NOTE — Assessment & Plan Note (Signed)
bp in fair control at this time  BP Readings from Last 1 Encounters:  05/15/19 134/80   No changes needed Most recent labs reviewed  Disc lifstyle change with low sodium diet and exercise

## 2019-05-15 NOTE — Progress Notes (Signed)
Subjective:    Patient ID: Krista Newton, female    DOB: 1966/03/12, 53 y.o.   MRN: [id number]  This visit occurred during the SARS-CoV-2 public health emergency.  Safety protocols were in place, including screening questions prior to the visit, additional usage of staff PPE, and extensive cleaning of exam room while observing appropriate contact time as indicated for disinfecting solutions.    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   Cough has slowed down Phlegm is yellow     Hearing Screening   125Hz  250Hz  500Hz  1000Hz  2000Hz  3000Hz  4000Hz  6000Hz  8000Hz   Right ear:   40 40 40  0    Left ear:   40 40 40  40      Visual Acuity Screening   Right eye Left eye Both eyes  Without correction:     With correction: 20/20 20/15 20/15    In the past a little hearing loss in R - but performed well today    Wt Readings from Last 3 Encounters:  05/15/19 235 lb 5 oz (106.7 kg)  11/21/18 247 lb 3.2 oz (112.1 kg)  08/01/18 246 lb (111.6 kg)  wt loss noted - 12 lb  Is not back to exercise since she had covid  40.08 kg/m   Pt had the covid 19 virus in November  Hep C/ HIV screen-will do   Flu vaccine-had at work in October  Zoster status - may be interested in shingrix  Tdap 9/17   Pap 1/19 -neg with pos HPV (Dr Phineas Real)  She will schedule at that office  No problems   Colon cancer screening -interested in colonoscopy  Mammogram (ordered on 04/21/19) She will re schedule (had covid)  Self breast exam -no lumps   In terms of immunizations- has access from school   Had chicken pox before she started elementary school   Hypertension  bp is stable today  No cp or palpitations or headaches or edema  No side effects to medicines  BP Readings from Last 3 Encounters:  05/15/19 134/80  11/21/18 (!) 148/88  08/01/18 (!) 162/98      DM2 Lab  Results  Component Value Date   HGBA1C 7.4 (A) 11/21/2018  improved at that time  Had a virtual visit last  Due for labs  Cannot take metformin  Last visit we inc glipizide and ref to endocrinology  (Dr Kelton Pillar) Declines ace or arb Last eye exam : 2/20 -normal   Hyperlipidemia Lab Results  Component Value Date   CHOL 236 (H) 11/21/2018   HDL 58.20 11/21/2018   LDLCALC 160 (H) 11/21/2018   TRIG 90.0 11/21/2018   CHOLHDL 4 11/21/2018  taking low dose crestor   H/o low vit D level  10.58 in July Her endocrinologist tx her with high dose ergocalciferol weekly for 3 mo and inst to switch to 1000 iu D3 daily after that  Had some funny sensation in R leg-it is better now   Patient Active Problem List   Diagnosis Date Noted  . Need for hepatitis C screening test 05/15/2019  . Encounter for screening for HIV 05/15/2019  . Screening-pulmonary TB 05/15/2019  . Colon cancer screening 05/15/2019  . Vitamin D deficiency 11/22/2018  . Numbness and tingling sensation of skin 10/04/2018  . Neck pain 08/18/2017  . Productive cough 08/18/2017  . Exposure to communicable  disease 01/19/2016  . Morbid obesity ([place]) 07/07/2015  . Essential hypertension 07/05/2015  . Hyperlipidemia associated with type 2 diabetes mellitus ([place]) 08/31/2014  . Type 2 diabetes mellitus with hyperglycemia, without long-term current use of insulin ([place]) 02/15/2014  . Leg cramps 02/06/2014  . Dry mouth 02/06/2014  . History of shingles 09/18/2013  . Carpal tunnel syndrome 05/31/2013  . Routine general medical examination at a health care facility 07/04/2012  . HEARING LOSS, RIGHT EAR 07/26/2008  . ALLERGIC RHINITIS 06/28/2008   Past Medical History:  Diagnosis Date  . Diabetes mellitus without complication ([place])   . Hypertension    Past Surgical History:  Procedure Laterality Date  . CARPAL TUNNEL RELEASE Right 04/24/14  . CESAREAN SECTION    . TUBAL LIGATION     Social History   Tobacco Use  .  Smoking status: Never Smoker  . Smokeless tobacco: Never Used  Substance Use Topics  . Alcohol use: Yes    Alcohol/week: 0.0 standard drinks    Comment: Rare  . Drug use: No   Family History  Problem Relation Age of Onset  . Hypertension Mother   . Diabetes Mother   . Heart failure Mother   . Hypertension Father   . Diabetes Sister   . Kidney failure Brother    Allergies  Allergen Reactions  . Metformin And Related Other (See Comments)    The XR caused sore throat, HA, GI issues  . Jardiance [Empagliflozin] Other (See Comments)    Yeast infections   Current Outpatient Medications on File Prior to Visit  Medication Sig Dispense Refill  . benzonatate (TESSALON) 200 MG capsule Take 1 capsule (200 mg total) by mouth 3 (three) times daily as needed for cough. Swallow whole, to not bite pill 30 capsule 1  . Blood Glucose Monitoring Suppl (ONE TOUCH ULTRA 2) w/Device KIT 1 Device by Does not apply route as directed. 1 each 0  . glipiZIDE (GLUCOTROL XL) 10 MG 24 hr tablet Take 1 tablet (10 mg total) by mouth 2 (two) times daily with a meal. 60 tablet 6  . glucose blood (ONE TOUCH ULTRA TEST) test strip TEST SUGAR TWICE A DAY(dx. E11.9) 100 each 3  . hydrochlorothiazide (HYDRODIURIL) 25 MG tablet Take 1 tablet (25 mg total) by mouth every other day. 15 tablet 5  . HYDROcodone-homatropine (HYCODAN) 5-1.5 MG/5ML syrup Take 5 mLs by mouth every 8 (eight) hours as needed for cough. Caution of sedation 60 mL 0  . Lancets (ONETOUCH ULTRASOFT) lancets Twice daily 100 each 12  . promethazine-dextromethorphan (PROMETHAZINE-DM) 6.25-15 MG/5ML syrup Take 5 mLs by mouth 4 (four) times daily as needed for cough. 118 mL 0  . rosuvastatin (CRESTOR) 5 MG tablet Take 1 tablet (5 mg total) by mouth daily. 30 tablet 6  . Vitamin D, Ergocalciferol, (DRISDOL) 1.25 MG (50000 UT) CAPS capsule TAKE 1 CAPSULE BY MOUTH ONE TIME PER WEEK 4 capsule 3   No current facility-administered medications on file prior to  visit.    Review of Systems  Constitutional: Negative for activity change, appetite change, fatigue, fever and unexpected weight change.  HENT: Negative for congestion, ear pain, rhinorrhea, sinus pressure and sore throat.        Past h/o R sided hearing loss  Eyes: Negative for pain, redness and visual disturbance.  Respiratory: Negative for cough, chest tightness, shortness of breath, wheezing and stridor.        Cough is much improved Still some phlegm  Cardiovascular: Negative for  chest pain and palpitations.  Gastrointestinal: Negative for abdominal pain, blood in stool, constipation and diarrhea.  Endocrine: Negative for polydipsia and polyuria.  Genitourinary: Negative for dysuria, frequency and urgency.  Musculoskeletal: Negative for arthralgias, back pain and myalgias.  Skin: Negative for pallor and rash.  Allergic/Immunologic: Negative for environmental allergies.  Neurological: Negative for dizziness, syncope and headaches.  Hematological: Negative for adenopathy. Does not bruise/bleed easily.  Psychiatric/Behavioral: Negative for decreased concentration and dysphoric mood. The patient is not nervous/anxious.        Objective:   Physical Exam Constitutional:      General: She is not in acute distress.    Appearance: Normal appearance. She is well-developed. She is obese. She is not ill-appearing or diaphoretic.  HENT:     Head: Normocephalic and atraumatic.     Right Ear: Tympanic membrane, ear canal and external ear normal.     Left Ear: Tympanic membrane, ear canal and external ear normal.     Nose: Nose normal. No congestion.     Mouth/Throat:     Mouth: Mucous membranes are moist.     Pharynx: Oropharynx is clear. No posterior oropharyngeal erythema.  Eyes:     General: No scleral icterus.    Extraocular Movements: Extraocular movements intact.     Conjunctiva/sclera: Conjunctivae normal.     Pupils: Pupils are equal, round, and reactive to light.  Neck:      Thyroid: No thyromegaly.     Vascular: No carotid bruit or JVD.  Cardiovascular:     Rate and Rhythm: Normal rate and regular rhythm.     Pulses: Normal pulses.     Heart sounds: Normal heart sounds. No gallop.   Pulmonary:     Effort: Pulmonary effort is normal. No respiratory distress.     Breath sounds: Normal breath sounds. No wheezing.     Comments: Good air exch Chest:     Chest wall: No tenderness.  Abdominal:     General: Bowel sounds are normal. There is no distension or abdominal bruit.     Palpations: Abdomen is soft. There is no mass.     Tenderness: There is no abdominal tenderness.     Hernia: No hernia is present.  Genitourinary:    Comments: Breast exam: No mass, nodules, thickening, tenderness, bulging, retraction, inflamation, nipple discharge or skin changes noted.  No axillary or clavicular LA.     Musculoskeletal:        General: No tenderness. Normal range of motion.     Cervical back: Normal range of motion and neck supple. No rigidity. No muscular tenderness.     Right lower leg: No edema.     Left lower leg: No edema.  Lymphadenopathy:     Cervical: No cervical adenopathy.  Skin:    General: Skin is warm and dry.     Coloration: Skin is not pale.     Findings: No erythema or rash.     Comments: Scattered skin tags  Neurological:     Mental Status: She is alert. Mental status is at baseline.     Cranial Nerves: No cranial nerve deficit.     Motor: No abnormal muscle tone.     Coordination: Coordination normal.     Gait: Gait normal.     Deep Tendon Reflexes: Reflexes are normal and symmetric. Reflexes normal.  Psychiatric:        Mood and Affect: Mood normal.        Cognition and Memory: Cognition  and memory normal.           Assessment & Plan:   Problem List Items Addressed This Visit      Cardiovascular and Mediastinum   Essential hypertension    bp in fair control at this time  BP Readings from Last 1 Encounters:  05/15/19 134/80    No changes needed Most recent labs reviewed  Disc lifstyle change with low sodium diet and exercise        Relevant Orders   CBC w/Diff (Completed)   Comprehensive metabolic panel (Completed)   Lipid panel (Completed)   TSH (Completed)   POCT Urinalysis Dipstick (Automated) (Completed)     Endocrine   Type 2 diabetes mellitus with hyperglycemia, without long-term current use of insulin ([facility])    Pt will continue to f/u with endocrinology (had improvement this summer)  Noted eye exam-due in feb Nl foot exam  Enc wt loss and low glycemic diet         Relevant Orders   POCT Urinalysis Dipstick (Automated) (Completed)   Hyperlipidemia associated with type 2 diabetes mellitus ([facility])    Disc goals for lipids and reasons to control them Rev last labs with pt Rev low sat fat diet in detail Pending lipid profile today  On low dose crestor      Relevant Orders   Lipid panel (Completed)     Other   Routine general medical examination at a health care facility - Primary    Reviewed health habits including diet and exercise and skin cancer prevention Reviewed appropriate screening tests for age  Also reviewed health mt list, fam hx and immunization status , as well as social and family history   See HPI Labs ordered Forms filled out for upcoming training / med assist Hep C and HIV screen done today Discussed the shingrix vaccine Enc pt to f/u with gyn for her pap (overdue)  Also to schedule her mammogram (also overdue due to pandemic)         Relevant Orders   POCT Urinalysis Dipstick (Automated) (Completed)   Morbid obesity ([place])    Some wt loss since last visit  Discussed how this problem influences overall health and the risks it imposes  Reviewed plan for weight loss with lower calorie diet (via better food choices and also portion control or program like weight watchers) and exercise building up to or more than 30 minutes 5 days per week including some aerobic  activity   commended      Exposure to communicable disease    Pt needs TB screen and varicella ab for her work/training form Done today      Relevant Orders   Varicella zoster antibody, IgG   Vitamin D deficiency    Level of 10.58 in July  Took 3 mo of weekly high dose tx from endocrinology Has taken 1000 iu daily D3 since then  Level drawn today      Relevant Orders   VITAMIN D 25 Hydroxy (Vit-D Deficiency, Fractures) (Completed)   Need for hepatitis C screening test    Hep C screen done      Relevant Orders   Hepatitis C antibody   Encounter for screening for HIV    HIV screen done      Relevant Orders   HIV antibody (with reflex)   Screening-pulmonary TB    Quant gold TB test for upcoming work training      Relevant Orders   QuantiFERON-TB Gold Plus  Colon cancer screening    Ref done for first screening colonoscopy  The office will call her       Relevant Orders   Ambulatory referral to Gastroenterology

## 2019-05-15 NOTE — Assessment & Plan Note (Signed)
Level of 10.58 in July  Took 3 mo of weekly high dose tx from endocrinology Has taken 1000 iu daily D3 since then  Level drawn today

## 2019-05-15 NOTE — Assessment & Plan Note (Signed)
Reviewed health habits including diet and exercise and skin cancer prevention Reviewed appropriate screening tests for age  Also reviewed health mt list, fam hx and immunization status , as well as social and family history   See HPI Labs ordered Forms filled out for upcoming training / med assist Hep C and HIV screen done today Discussed the shingrix vaccine Enc pt to f/u with gyn for her pap (overdue)  Also to schedule her mammogram (also overdue due to pandemic)

## 2019-05-15 NOTE — Assessment & Plan Note (Signed)
Disc goals for lipids and reasons to control them Rev last labs with pt Rev low sat fat diet in detail Pending lipid profile today  On low dose crestor

## 2019-05-15 NOTE — Assessment & Plan Note (Signed)
Ref done for first screening colonoscopy  The office will call her

## 2019-05-15 NOTE — Assessment & Plan Note (Signed)
HIV screen done

## 2019-05-15 NOTE — Assessment & Plan Note (Signed)
Pt needs TB screen and varicella ab for her work/training form Done today

## 2019-05-15 NOTE — Patient Instructions (Addendum)
If you are interested in the shingles vaccine series (Shingrix), call your insurance or pharmacy to check on coverage and location it must be given.  If affordable - you can schedule it here or at your pharmacy depending on coverage    Make sure to schedule your gyn follow up / pap   Our office will call you regarding a colonoscopy referral   I need your date of polio vaccines, and measles/mumps/rubella  Need dates of shots and diseases   Follow up with endocrinology when you you can   I'm glad you are feeling better

## 2019-05-15 NOTE — Assessment & Plan Note (Signed)
Pt will continue to f/u with endocrinology (had improvement this summer)  Noted eye exam-due in feb Nl foot exam  Enc wt loss and low glycemic diet

## 2019-05-15 NOTE — Assessment & Plan Note (Signed)
Hep C screen done

## 2019-05-16 ENCOUNTER — Encounter: Payer: 59 | Admitting: Family Medicine

## 2019-05-16 LAB — HIV ANTIBODY (ROUTINE TESTING W REFLEX): HIV 1&2 Ab, 4th Generation: NONREACTIVE

## 2019-05-16 LAB — VARICELLA ZOSTER ANTIBODY, IGG: Varicella IgG: 1706 index

## 2019-05-16 LAB — HEPATITIS C ANTIBODY
Hepatitis C Ab: NONREACTIVE
SIGNAL TO CUT-OFF: 0.02 (ref <1.00)

## 2019-05-17 ENCOUNTER — Encounter: Payer: Self-pay | Admitting: Gastroenterology

## 2019-05-17 LAB — QUANTIFERON-TB GOLD PLUS
Mitogen-NIL: >10 IU/mL
NIL: 0.04 IU/mL
QuantiFERON-TB Gold Plus: NEGATIVE
TB1-NIL: 0 IU/mL
TB2-NIL: 0 IU/mL

## 2019-05-17 NOTE — Telephone Encounter (Signed)
Copy for pt °Copy for scan °

## 2019-05-18 ENCOUNTER — Telehealth: Payer: Self-pay | Admitting: *Deleted

## 2019-05-18 NOTE — Telephone Encounter (Signed)
Addressed through result notes  

## 2019-05-18 NOTE — Telephone Encounter (Signed)
Left VM requesting pt to call the office back regarding labs 

## 2019-05-22 ENCOUNTER — Telehealth: Payer: Self-pay | Admitting: Family Medicine

## 2019-05-22 DIAGNOSIS — Z209 Contact with and (suspected) exposure to unspecified communicable disease: Secondary | ICD-10-CM

## 2019-05-22 NOTE — Telephone Encounter (Signed)
Order is done for MMR immunity Let me know if this is not the correct test for titers for each Thanks

## 2019-05-22 NOTE — Telephone Encounter (Signed)
Patient called. She stated that she received paperwork with her Immunizations .  Patient stated that a TB test was missing. At the time of this test She stated that Dr Glori Bickers stated there was a shortage with TB test so she would order a TB gold plus. But the patient did not see anything about this on the paperwork

## 2019-05-22 NOTE — Telephone Encounter (Signed)
Spoke with pt and her job didn't accept the TB lab test they said that she is required to get the TB skin test. Also the MMR titer is out of date and pt needs another one, she wants to make sure it's the lab test that checks all 3 components of MMR. I got the OK from [name] so lab appt scheduled for tomorrow to check MMR titers and I will do the TB skin test. Please put orders in for labs

## 2019-05-23 ENCOUNTER — Other Ambulatory Visit (INDEPENDENT_AMBULATORY_CARE_PROVIDER_SITE_OTHER): Payer: 59

## 2019-05-23 ENCOUNTER — Other Ambulatory Visit: Payer: Self-pay

## 2019-05-23 DIAGNOSIS — Z111 Encounter for screening for respiratory tuberculosis: Secondary | ICD-10-CM

## 2019-05-23 DIAGNOSIS — Z209 Contact with and (suspected) exposure to unspecified communicable disease: Secondary | ICD-10-CM | POA: Diagnosis not present

## 2019-05-23 NOTE — Addendum Note (Signed)
Addended by: Tammi Sou on: 05/23/2019 09:28 AM   Modules accepted: Orders

## 2019-05-24 LAB — MEASLES/MUMPS/RUBELLA IMMUNITY
Mumps IgG: 27 AU/mL
Rubella: 22.6 Index
Rubeola IgG: 105 AU/mL

## 2019-05-25 LAB — TB SKIN TEST
Induration: 0 mm
TB Skin Test: NEGATIVE

## 2019-07-07 ENCOUNTER — Encounter: Payer: 59 | Admitting: Gastroenterology

## 2019-07-14 ENCOUNTER — Other Ambulatory Visit: Payer: Self-pay | Admitting: Internal Medicine

## 2019-07-21 ENCOUNTER — Other Ambulatory Visit: Payer: Self-pay

## 2019-07-21 ENCOUNTER — Ambulatory Visit (AMBULATORY_SURGERY_CENTER): Payer: Self-pay | Admitting: *Deleted

## 2019-07-21 VITALS — Temp 98.0°F | Ht 64.25 in | Wt 236.0 lb

## 2019-07-21 DIAGNOSIS — Z1211 Encounter for screening for malignant neoplasm of colon: Secondary | ICD-10-CM

## 2019-07-21 MED ORDER — SUPREP BOWEL PREP KIT 17.5-3.13-1.6 GM/177ML PO SOLN: 1.0000 | Freq: Once | ORAL | 0 refills | Status: AC

## 2019-07-21 NOTE — Progress Notes (Signed)
[facility] covid test 3-17 Wednesday   suprep Code and coupon $15   No egg or soy allergy known to patient  No issues with past sedation with any surgeries  or procedures, no intubation problems  No diet pills per patient No home 02 use per patient  No blood thinners per patient  Pt denies issues with constipation  No A fib or A flutter  EMMI video sent to pt's e mail   Due to the COVID-19 pandemic we are asking patients to follow these guidelines. Please only bring one care partner. Please be aware that your care partner may wait in the car in the parking lot or if they feel like they will be too hot to wait in the car, they may wait in the lobby on the 4th floor. All care partners are required to wear a mask the entire time (we do not have any that we can provide them), they need to practice social distancing, and we will do a Covid check for all patient's and care partners when you arrive. Also we will check their temperature and your temperature. If the care partner waits in their car they need to stay in the parking lot the entire time and we will call them on their cell phone when the patient is ready for discharge so they can bring the car to the front of the building. Also all patient's will need to wear a mask into building.

## 2019-08-01 ENCOUNTER — Encounter: Payer: Self-pay | Admitting: Gastroenterology

## 2019-08-02 ENCOUNTER — Other Ambulatory Visit: Payer: Self-pay

## 2019-08-02 ENCOUNTER — Other Ambulatory Visit
Admission: RE | Admit: 2019-08-02 | Discharge: 2019-08-02 | Disposition: A | Discharge status: Home / Self Care | Payer: 59 | Source: Ambulatory Visit | Attending: Gastroenterology | Admitting: Gastroenterology

## 2019-08-02 DIAGNOSIS — Z01812 Encounter for preprocedural laboratory examination: Secondary | ICD-10-CM | POA: Diagnosis not present

## 2019-08-02 DIAGNOSIS — Z20822 Contact with and (suspected) exposure to covid-19: Secondary | ICD-10-CM | POA: Diagnosis not present

## 2019-08-02 LAB — SARS CORONAVIRUS 2 (TAT 6-24 HRS): SARS Coronavirus 2: NEGATIVE

## 2019-08-04 ENCOUNTER — Other Ambulatory Visit: Payer: Self-pay | Admitting: Internal Medicine

## 2019-08-04 ENCOUNTER — Other Ambulatory Visit: Payer: Self-pay

## 2019-08-04 ENCOUNTER — Encounter: Payer: Self-pay | Admitting: Gastroenterology

## 2019-08-04 ENCOUNTER — Ambulatory Visit (AMBULATORY_SURGERY_CENTER): Payer: 59 | Admitting: Gastroenterology

## 2019-08-04 VITALS — BP 120/79 | HR 78 | Temp 96.9°F | Resp 9 | Ht 64.0 in | Wt 236.0 lb

## 2019-08-04 DIAGNOSIS — D123 Benign neoplasm of transverse colon: Secondary | ICD-10-CM

## 2019-08-04 DIAGNOSIS — D125 Benign neoplasm of sigmoid colon: Secondary | ICD-10-CM | POA: Diagnosis not present

## 2019-08-04 DIAGNOSIS — D124 Benign neoplasm of descending colon: Secondary | ICD-10-CM

## 2019-08-04 DIAGNOSIS — Z1211 Encounter for screening for malignant neoplasm of colon: Secondary | ICD-10-CM | POA: Diagnosis present

## 2019-08-04 MED ORDER — SODIUM CHLORIDE 0.9 % IV SOLN: 500.0000 mL | Freq: Once | INTRAVENOUS | Status: DC

## 2019-08-04 NOTE — Progress Notes (Signed)
A and O x3. Report to RN. Tolerated MAC anesthesia well.

## 2019-08-04 NOTE — Op Note (Signed)
[place] Patient Name: Krista Newton Procedure Date: 08/04/2019 11:46 AM MRN: [id number] Endoscopist: Remo Lipps P. Havery Moros , MD Age: 54 Referring MD:  Date of Birth: 05/22/1965 Gender: Female Account #: [id number] Procedure:                Colonoscopy Indications:              Screening for colorectal malignant neoplasm, This                            is the patient's first colonoscopy Medicines:                Monitored Anesthesia Care Procedure:                Pre-Anesthesia Assessment:                           - Prior to the procedure, a History and Physical                            was performed, and patient medications and                            allergies were reviewed. The patient's tolerance of                            previous anesthesia was also reviewed. The risks                            and benefits of the procedure and the sedation                            options and risks were discussed with the patient.                            All questions were answered, and informed consent                            was obtained. Prior Anticoagulants: The patient has                            taken no previous anticoagulant or antiplatelet                            agents. ASA Grade Assessment: III - A patient with                            severe systemic disease. After reviewing the risks                            and benefits, the patient was deemed in                            satisfactory condition to undergo the procedure.  After obtaining informed consent, the colonoscope                            was passed under direct vision. Throughout the                            procedure, the patient's blood pressure, pulse, and                            oxygen saturations were monitored continuously. The                            Colonoscope was introduced through the anus and                            advanced to the  the cecum, identified by                            appendiceal orifice and ileocecal valve. The                            colonoscopy was performed without difficulty. The                            patient tolerated the procedure well. The quality                            of the bowel preparation was good. The ileocecal                            valve, appendiceal orifice, and rectum were                            photographed. Scope In: 11:49:44 AM Scope Out: 12:08:50 PM Scope Withdrawal Time: 0 hours 13 minutes 53 seconds  Total Procedure Duration: 0 hours 19 minutes 6 seconds  Findings:                 The perianal and digital rectal examinations were                            normal.                           A 4 mm polyp was found in the transverse colon. The                            polyp was sessile. The polyp was removed with a                            cold snare. Resection and retrieval were complete.                           A 5 mm polyp was found in the descending colon. The  polyp was sessile. The polyp was removed with a                            cold snare. Resection and retrieval were complete.                           A 4 mm polyp was found in the sigmoid colon. The                            polyp was sessile. The polyp was removed with a                            cold snare. Resection and retrieval were complete.                           Multiple small-mouthed diverticula were found in                            the sigmoid colon.                           The exam was otherwise without abnormality. Complications:            No immediate complications. Estimated blood loss:                            Minimal. Estimated Blood Loss:     Estimated blood loss was minimal. Impression:               - One 4 mm polyp in the transverse colon, removed                            with a cold snare. Resected and retrieved.                            - One 5 mm polyp in the descending colon, removed                            with a cold snare. Resected and retrieved.                           - One 4 mm polyp in the sigmoid colon, removed with                            a cold snare. Resected and retrieved.                           - Diverticulosis in the sigmoid colon.                           - The examination was otherwise normal. Recommendation:           - Patient has a contact number available for  emergencies. The signs and symptoms of potential                            delayed complications were discussed with the                            patient. Return to normal activities tomorrow.                            Written discharge instructions were provided to the                            patient.                           - Resume previous diet.                           - Continue present medications.                           - Await pathology results. Remo Lipps P. Theodoro Koval, MD 08/04/2019 12:12:29 PM This report has been signed electronically.

## 2019-08-04 NOTE — Progress Notes (Signed)
Called to room to assist during endoscopic procedure.  Patient ID and intended procedure confirmed with present staff. Received instructions for my participation in the procedure from the performing physician.  

## 2019-08-04 NOTE — Patient Instructions (Signed)
Please, read all of the handouts given to you by your recovery room nurse.  Thank-you for choosing us for your healthcare needs today.  YOU HAD AN ENDOSCOPIC PROCEDURE TODAY AT THE [facility]:   Refer to the procedure report that was given to you for any specific questions about what was found during the examination.  If the procedure report does not answer your questions, please call your gastroenterologist to clarify.  If you requested that your care partner not be given the details of your procedure findings, then the procedure report has been included in a sealed envelope for you to review at your convenience later.  YOU SHOULD EXPECT: Some feelings of bloating in the abdomen. Passage of more gas than usual.  Walking can help get rid of the air that was put into your GI tract during the procedure and reduce the bloating. If you had a lower endoscopy (such as a colonoscopy or flexible sigmoidoscopy) you may notice spotting of blood in your stool or on the toilet paper. If you underwent a bowel prep for your procedure, you may not have a normal bowel movement for a few days.  Please Note:  You might notice some irritation and congestion in your nose or some drainage.  This is from the oxygen used during your procedure.  There is no need for concern and it should clear up in a day or so.  SYMPTOMS TO REPORT IMMEDIATELY:   Following lower endoscopy (colonoscopy or flexible sigmoidoscopy):  Excessive amounts of blood in the stool  Significant tenderness or worsening of abdominal pains  Swelling of the abdomen that is new, acute  Fever of 100F or higher   For urgent or emergent issues, a gastroenterologist can be reached at any hour by calling [phone]. Do not use MyChart messaging for urgent concerns.    DIET:  We do recommend a small meal at first, but then you may proceed to your regular diet.  Drink plenty of fluids but you should avoid alcoholic beverages for 24  hours.  ACTIVITY:  You should plan to take it easy for the rest of today and you should NOT DRIVE or use heavy machinery until tomorrow (because of the sedation medicines used during the test).    FOLLOW UP: Our staff will call the number listed on your records 48-72 hours following your procedure to check on you and address any questions or concerns that you may have regarding the information given to you following your procedure. If we do not reach you, we will leave a message.  We will attempt to reach you two times.  During this call, we will ask if you have developed any symptoms of COVID 19. If you develop any symptoms (ie: fever, flu-like symptoms, shortness of breath, cough etc.) before then, please call [phone].  If you test positive for Covid 19 in the 2 weeks post procedure, please call and report this information to us.    If any biopsies were taken you will be contacted by phone or by letter within the next 1-3 weeks.  Please call us at [phone] if you have not heard about the biopsies in 3 weeks.    SIGNATURES/CONFIDENTIALITY: You and/or your care partner have signed paperwork which will be entered into your electronic medical record.  These signatures attest to the fact that that the information above on your After Visit Summary has been reviewed and is understood.  Full responsibility of the confidentiality of this discharge   information lies with you and/or your care-partner. 

## 2019-08-04 NOTE — Progress Notes (Signed)
Pt's states no medical or surgical changes since previsit or office visit.  LC temp, CW vitals, AB Iv.

## 2019-08-07 ENCOUNTER — Other Ambulatory Visit: Payer: Self-pay | Admitting: Internal Medicine

## 2019-08-08 ENCOUNTER — Telehealth: Payer: Self-pay

## 2019-08-08 NOTE — Telephone Encounter (Signed)
  Follow up Call-  Call back number 08/04/2019  Post procedure Call Back phone  # [phone]  Permission to leave phone message Yes  Some recent data might be hidden     Patient questions:  Do you have a fever, pain , or abdominal swelling? No. Pain Score  0 *  Have you tolerated food without any problems? Yes.    Have you been able to return to your normal activities? Yes.    Do you have any questions about your discharge instructions: Diet   No. Medications  No. Follow up visit  No.  Do you have questions or concerns about your Care? No.  Actions: * If pain score is 4 or above: 1. No action needed, pain <4.Have you developed a fever since your procedure? no  2.   Have you had an respiratory symptoms (SOB or cough) since your procedure? no  3.   Have you tested positive for COVID 19 since your procedure no  4.   Have you had any family members/close contacts diagnosed with the COVID 19 since your procedure?  no   If yes to any of these questions please route to Joylene John, RN and Alphonsa Gin, Therapist, sports.

## 2019-08-12 ENCOUNTER — Ambulatory Visit: Payer: 59 | Attending: Internal Medicine

## 2019-08-12 DIAGNOSIS — Z23 Encounter for immunization: Secondary | ICD-10-CM

## 2019-08-12 NOTE — Progress Notes (Signed)
   Covid-19 Vaccination Clinic  Name:  Krista Newton    MRN: [id number] DOB: 20-Nov-1965  08/12/2019  Ms. Holstad was observed post Covid-19 immunization for 15 minutes without incident. She was provided with Vaccine Information Sheet and instruction to access the V-Safe system.   Ms. Bishara was instructed to call 911 with any severe reactions post vaccine: Marland Kitchen Difficulty breathing  . Swelling of face and throat  . A fast heartbeat  . A bad rash all over body  . Dizziness and weakness   Immunizations Administered    Name Date Dose VIS Date Route   Pfizer COVID-19 Vaccine 08/12/2019  4:59 PM 0.3 mL 04/28/2019 Intramuscular   Manufacturer: [place]   Lot: [id number]   [place]: [id number]

## 2019-09-06 ENCOUNTER — Ambulatory Visit: Payer: 59 | Attending: Internal Medicine

## 2019-09-06 DIAGNOSIS — Z23 Encounter for immunization: Secondary | ICD-10-CM

## 2019-09-06 NOTE — Progress Notes (Signed)
   Covid-19 Vaccination Clinic  Name:  Krista Newton    MRN: [id number] DOB: 04/23/1966  09/06/2019  [name] was observed post Covid-19 immunization for 15 minutes without incident. She was provided with Vaccine Information Sheet and instruction to access the V-Safe system.   [name] was instructed to call 911 with any severe reactions post vaccine: Marland Kitchen Difficulty breathing  . Swelling of face and throat  . A fast heartbeat  . A bad rash all over body  . Dizziness and weakness   Immunizations Administered    Name Date Dose VIS Date Route   Pfizer COVID-19 Vaccine 09/06/2019  3:59 PM 0.3 mL 07/12/2018 Intramuscular   Manufacturer: [place]   Lot: [id number]   [place]: [id number]      Covid-19 Vaccination Clinic  Name:  Krista Newton    MRN: [id number] DOB: January 26, 1966  09/06/2019  [name] was observed post Covid-19 immunization for 15 minutes without incident. She was provided with Vaccine Information Sheet and instruction to access the V-Safe system.   [name] was instructed to call 911 with any severe reactions post vaccine: Marland Kitchen Difficulty breathing  . Swelling of face and throat  . A fast heartbeat  . A bad rash all over body  . Dizziness and weakness   Immunizations Administered    Name Date Dose VIS Date Route   Pfizer COVID-19 Vaccine 09/06/2019  3:59 PM 0.3 mL 07/12/2018 Intramuscular   Manufacturer: [place]   Lot: [id number]   [place]: [id number]

## 2019-10-24 ENCOUNTER — Other Ambulatory Visit: Payer: Self-pay | Admitting: *Deleted

## 2019-10-24 MED ORDER — HYDROCHLOROTHIAZIDE 25 MG PO TABS: 25.0000 mg | ORAL_TABLET | ORAL | 5 refills | Status: DC

## 2019-10-24 NOTE — Telephone Encounter (Signed)
Patient left a voicemail stating that she is trying to get a refill on her HCTZ. Patient stated that the pharmacist told her that they are waiting on an okay from her doctor.

## 2019-10-24 NOTE — Telephone Encounter (Signed)
No refill request in her chart but med hast been filled since 2019, will route to PCP for approval

## 2019-11-13 ENCOUNTER — Other Ambulatory Visit: Payer: Self-pay | Admitting: Internal Medicine

## 2019-12-06 ENCOUNTER — Other Ambulatory Visit: Payer: Self-pay | Admitting: Internal Medicine

## 2019-12-06 ENCOUNTER — Other Ambulatory Visit: Payer: Self-pay

## 2019-12-06 ENCOUNTER — Telehealth: Payer: Self-pay | Admitting: Internal Medicine

## 2019-12-06 MED ORDER — GLIPIZIDE ER 10 MG PO TB24: 10.0000 mg | ORAL_TABLET | Freq: Two times a day (BID) | ORAL | 0 refills | Status: DC

## 2019-12-06 NOTE — Telephone Encounter (Signed)
sent 

## 2019-12-06 NOTE — Telephone Encounter (Signed)
Medication Refill Request  Did you call your pharmacy and request this refill first? Yes  . If patient has not contacted pharmacy first, instruct them to do so for future refills.  . Remind them that contacting the pharmacy for their refill is the quickest method to get the refill.  . Refill policy also stated that it will take anywhere between 24-72 hours to receive the refill.   Name of medication?  glipiZIDE 10 MG      Is this a 90 day supply? Unknown  Name and location of pharmacy? [id number] [place], [place] - [address]

## 2019-12-08 ENCOUNTER — Other Ambulatory Visit: Payer: Self-pay | Admitting: Internal Medicine

## 2019-12-08 ENCOUNTER — Other Ambulatory Visit: Payer: Self-pay

## 2019-12-08 ENCOUNTER — Encounter: Payer: Self-pay | Admitting: Internal Medicine

## 2019-12-08 ENCOUNTER — Ambulatory Visit: Payer: 59 | Admitting: Internal Medicine

## 2019-12-08 VITALS — BP 134/84 | HR 94 | Ht 64.0 in | Wt 241.8 lb

## 2019-12-08 DIAGNOSIS — E1165 Type 2 diabetes mellitus with hyperglycemia: Secondary | ICD-10-CM | POA: Diagnosis not present

## 2019-12-08 DIAGNOSIS — E785 Hyperlipidemia, unspecified: Secondary | ICD-10-CM | POA: Insufficient documentation

## 2019-12-08 DIAGNOSIS — E559 Vitamin D deficiency, unspecified: Secondary | ICD-10-CM

## 2019-12-08 LAB — POCT GLYCOSYLATED HEMOGLOBIN (HGB A1C): Hemoglobin A1C: 7.3 % — AB (ref 4.0–5.6)

## 2019-12-08 LAB — GLUCOSE, POCT (MANUAL RESULT ENTRY): POC Glucose: 170 mg/dl — AB (ref 70–99)

## 2019-12-08 MED ORDER — GLIPIZIDE ER 10 MG PO TB24: 10.0000 mg | ORAL_TABLET | Freq: Two times a day (BID) | ORAL | 3 refills | Status: DC

## 2019-12-08 MED ORDER — ROSUVASTATIN CALCIUM 5 MG PO TABS: 5.0000 mg | ORAL_TABLET | ORAL | 1 refills | Status: DC

## 2019-12-08 NOTE — Patient Instructions (Addendum)
-   Continue Glipizide XL 10 mg Twice daily  -  Vitamin D3 1000 iu daily  - Take crestor 5 mg Three times a week    - Brisk walking 150 minutes a week     HOW TO TREAT LOW BLOOD SUGARS (Blood sugar LESS THAN 70 MG/DL)  Please follow the RULE OF 15 for the treatment of hypoglycemia treatment (when your (blood sugars are less than 70 mg/dL)    STEP 1: Take 15 grams of carbohydrates when your blood sugar is low, which includes:   3-4 GLUCOSE TABS  OR  3-4 OZ OF JUICE OR REGULAR SODA OR  ONE TUBE OF GLUCOSE GEL     STEP 2: RECHECK blood sugar in 15 MINUTES STEP 3: If your blood sugar is still low at the 15 minute recheck --> then, go back to STEP 1 and treat AGAIN with another 15 grams of carbohydrates.

## 2019-12-08 NOTE — Progress Notes (Signed)
Name: Krista Newton  Age/ Sex: 54 y.o., female   MRN/ DOB: [id number], August 25, 1965     PCP: Abner Greenspan, MD   Reason for Endocrinology Evaluation: Type 2 Diabetes Mellitus     Initial Endocrinology Clinic Visit: 08/01/2018      PATIENT IDENTIFIER: [name] is a 54 y.o. female with a past medical history of HTN and T2DM. The patient has followed with Endocrinology clinic since 08/01/2018 for consultative assistance with management of her diabetes.  DIABETIC HISTORY:  Krista Newton was diagnosed with T2DM in 2015. She reports intolerance to Metformin (Throat chemical burn ) . Her hemoglobin A1c has ranged from 7.3% in 2017, peaking at 10.0% in 2020.  On her initial visit to our clinic her A1c was 10.0 % and was on Glipizide.   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  Today (12/08/2019): Krista Newton is here for a   follow up on diabetes management.She has not been to our office in 1 yr.  She checks her blood sugars 2 times daily, preprandial to breakfast and supper. The patient has not had hypoglycemic episodes since the last clinic visit.      HOME ENDOCRINE REGIMEN:  Glipizide XL 10 mg BID  Rosuvastatin 5 mg daily - uses it occasionally  Vitamin D 400 iu daily    GLUCOSE LOG : N/A  DIABETIC COMPLICATIONS: Microvascular complications:    Denies: CKD, neuropathy, retinopathy  Last eye exam: Completed 12/2018   Macrovascular complications:    Denies: CAD, PVD, CVA   HISTORY:  Past Medical History:  Past Medical History:  Diagnosis Date   Allergy    Anemia    with pregnancy    COVID-19 virus infection 04/17/2019   Diabetes mellitus without complication ([place])    Hyperlipidemia    Hypertension    Past Surgical History:  Past Surgical History:  Procedure Laterality Date    CARPAL TUNNEL RELEASE Bilateral 04/24/2014   CESAREAN SECTION     TUBAL LIGATION      Social History:  reports that she has never smoked. She has never used smokeless tobacco. She reports current alcohol use. She reports that she does not use drugs. Family History:  Family History  Problem Relation Age of Onset   Hypertension Mother    Diabetes Mother    Heart failure Mother    Hypertension Father    Diabetes Sister    Kidney failure Brother    Colon cancer Neg Hx    Colon polyps Neg Hx    Esophageal cancer Neg Hx    Rectal cancer Neg Hx    Stomach cancer Neg Hx      HOME MEDICATIONS: Allergies as of 12/08/2019      Reactions   Metformin And Related Other (See Comments)   The XR caused sore throat, HA, GI issues   Jardiance [empagliflozin] Other (See Comments)   Yeast infections      Medication List       Accurate as of December 08, 2019  3:36 PM. If you have any questions, ask your nurse or doctor.        STOP taking these medications   Vitamin D (Ergocalciferol) 1.25 MG (50000 UNIT) Caps capsule Commonly known as: DRISDOL Stopped by: Dorita Sciara, MD  TAKE these medications   benzonatate 200 MG capsule Commonly known as: TESSALON Take 1 capsule (200 mg total) by mouth 3 (three) times daily as needed for cough. Swallow whole, to not bite pill   glipiZIDE 10 MG 24 hr tablet Commonly known as: GLUCOTROL XL Take 1 tablet (10 mg total) by mouth 2 (two) times daily with a meal.   glucose blood test strip Commonly known as: ONE TOUCH ULTRA TEST TEST SUGAR TWICE A DAY(dx. E11.9)   hydrochlorothiazide 25 MG tablet Commonly known as: HYDRODIURIL Take 1 tablet (25 mg total) by mouth every other day.   HYDROcodone-homatropine 5-1.5 MG/5ML syrup Commonly known as: HYCODAN Take 5 mLs by mouth every 8 (eight) hours as needed for cough. Caution of sedation   naproxen sodium 220 MG tablet Commonly known as: ALEVE Take 220 mg by mouth 2 (two)  times daily as needed.   ONE TOUCH ULTRA 2 w/Device Kit 1 Device by Does not apply route as directed.   onetouch ultrasoft lancets Twice daily   promethazine-dextromethorphan 6.25-15 MG/5ML syrup Commonly known as: PROMETHAZINE-DM Take 5 mLs by mouth 4 (four) times daily as needed for cough.   rosuvastatin 5 MG tablet Commonly known as: Crestor Take 1 tablet (5 mg total) by mouth as directed. Three times a week What changed:   when to take this  additional instructions Changed by: Dorita Sciara, MD       VITAL SIGNS: BP (!) 162/98 (BP Location: Right Arm, Patient Position: Sitting, Cuff Size: Small)    Pulse 80    Ht 5' 5"  (1.651 m)    Wt 246 lb (111.6 kg)    SpO2 98%    BMI 40.94 kg/m    PHYSICAL EXAM:  General: Pt appears well and is in NAD  Neck: Thyroid: Thyroid size normal.  No goiter or nodules appreciated. No thyroid bruit.  Lungs: Clear with good BS bilat with no rales, rhonchi, or wheezes  Heart: RRR with normal S1 and S2 and no gallops; no murmurs; no rub  Abdomen: Normoactive bowel sounds, soft, nontender, without masses or organomegaly palpable  Extremities:  Lower extremities - No pretibial edema. No lesions.  Neuro: MS is good with appropriate affect, pt is alert and Ox3    DM foot exam: 12/08/2019 The skin of the feet is intact without sores or ulcerations. The pedal pulses are 2+ on right and 2+ on left. The sensation is intact to a screening 5.07, 10 gram monofilament bilaterally    DATA REVIEWED:  Lab Results  Component Value Date   HGBA1C 7.3 (A) 12/08/2019   HGBA1C 7.4 (A) 11/21/2018   HGBA1C 10.0 (H) 05/20/2018   Lab Results  Component Value Date   MICROALBUR <0.7 04/24/2016   LDLCALC 182 (H) 05/15/2019   CREATININE 0.68 05/15/2019    ASSESSMENT / PLAN / RECOMMENDATIONS:   1) Type 2 Diabetes Mellitus, Sub-OPtimally controlled, Without complications - Most recent A1c of 7.3 %. Goal A1c < 7.0 %. Down from 10.0%     - A1c  stable, I think she can improve this with lifestyle changes, she would like a new referral to our RD. - She was also encouraged to exercise ~ 150 min/week  - She is intolerant to metformin, Rybelsus and Jardiance   MEDICATIONS:  Continue Glipizide XL 10 mg BID    EDUCATION / INSTRUCTIONS:  BG monitoring instructions: Patient is instructed to check her blood sugars 2 times a day, fasting and supper.  Call [facility] if: BG persistently < 70   I reviewed the Rule of 15 for the treatment of hypoglycemia in detail with the patient. Literature supplied.  2) Diabetic complications:   Eye: Does not have known diabetic retinopathy.   Neuro/ Feet: Does not have known diabetic peripheral neuropathy.  Renal: Patient does not have known baseline CKD. She is not on an ACEI/ARB at present.   3) Dyslipidemia :  - Intolerant to daily rosuvastatin due to myalgia, discussed the cardiovascular benefits of statins and to try and take it 3x weekly for now    Medications Rosuvastatin 5 mg , three times a week   4) Vitamin D Deficiency :   - Pt to start  OTC  D3 1000 iu daily    F/U in 6 months    Signed electronically by: Mack Guise, MD  [facility]  [facility] [address] [place], [place] [address] Phone: [phone] FAX: [phone]   CC: Tower, Wynelle Fanny, MD [place] [address] Phone: [phone]  Fax: [phone]  Return to Endocrinology clinic as below: Future Appointments  Date Time Provider [place]  06/14/2020  3:40 PM [place] Krempasky, Melanie Crazier, MD [facility] None

## 2019-12-22 LAB — HM DIABETES EYE EXAM

## 2019-12-28 ENCOUNTER — Encounter: Payer: Self-pay | Admitting: Family Medicine

## 2020-01-04 ENCOUNTER — Encounter: Payer: 59 | Attending: Internal Medicine | Admitting: Dietician

## 2020-01-04 ENCOUNTER — Encounter: Payer: Self-pay | Admitting: Dietician

## 2020-01-04 ENCOUNTER — Other Ambulatory Visit: Payer: Self-pay

## 2020-01-04 DIAGNOSIS — E785 Hyperlipidemia, unspecified: Secondary | ICD-10-CM | POA: Diagnosis present

## 2020-01-04 DIAGNOSIS — E1169 Type 2 diabetes mellitus with other specified complication: Secondary | ICD-10-CM | POA: Insufficient documentation

## 2020-01-04 NOTE — Patient Instructions (Addendum)
Consider alternative sweeteners such as stevia. Rethink what you drink.  Aim for beverages without carbohydrates. Buy a new crock pot. How can you incorporate exercise into your day?  Walk during lunch.

## 2020-01-04 NOTE — Progress Notes (Signed)
Diabetes Self-Management Education  Visit Type: First/Initial  Appt. Start Time: 0915 Appt. End Time: 1030  01/05/2020  [name], identified by name and date of birth, is a 54 y.o. female with a diagnosis of Diabetes: Type 2.   ASSESSMENT Patient is here today alone.  She wants to learn more about diabetes and controlling her cholesterol.  She states that her mother had diabetes and wants to keep up to date on anything new.  She states that her A1C is lower now but has not changed anything about her eating habits.  She last had education in 2015.  History includes Type 2 Diabetes, HLD, HTN, vitamin D deficiency, myalgias with statins per patient Labs noted to include:  Cholesterol 247, HDL 47, LDL 182, Triglycerides 89, vitamin D 17.9 05/15/2019 A1C 7.3% 12/08/2019 decreased from 10 12/08/2019  Weight hx: 243 lbs 01/04/2020 254 lbs 2020  Highest adult weight  Patient lives with her husband and 2 adult sons.  She does the shopping and cooking.  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  She states that she has a problem with time.  Weight 243 lb (110.2 kg). Body mass index is 41.71 kg/m.   Diabetes Self-Management Education - 01/04/20 0933      Visit Information   Visit Type First/Initial      Initial Visit   Diabetes Type Type 2    Are you currently following a meal plan? No    Are you taking your medications as prescribed? Yes    Date Diagnosed 2015      Health Coping   How would you rate your overall health? Good      Psychosocial Assessment   Patient Belief/Attitude about Diabetes Motivated to manage diabetes    Self-care barriers None    Self-management support Doctor's office    Other persons present Patient    Patient Concerns Nutrition/Meal planning;Glycemic Control;Healthy Lifestyle    Special Needs None    Preferred Learning Style No preference indicated    Learning Readiness Ready    How often do you need to  have someone help you when you read instructions, pamphlets, or other written materials from your doctor or pharmacy? 1 - Never    What is the last grade level you completed in school? 3 years college      Pre-Education Assessment   Patient understands the diabetes disease and treatment process. Needs Review    Patient understands incorporating nutritional management into lifestyle. Needs Review    Patient undertands incorporating physical activity into lifestyle. Needs Review    Patient understands using medications safely. Needs Review    Patient understands monitoring blood glucose, interpreting and using results Needs Review    Patient understands prevention, detection, and treatment of acute complications. Needs Review    Patient understands prevention, detection, and treatment of chronic complications. Needs Review    Patient understands how to develop strategies to address psychosocial issues. Needs Review    Patient understands how to develop strategies to promote health/change behavior. Needs Review      Complications   Last HgB A1C per patient/outside source 7.3 %   12/08/19, 10% 05/20/2018   How often do you check your blood sugar? 1-2 times/day    Fasting Blood glucose range (mg/dL) 130-179    Postprandial Blood glucose range (mg/dL) 130-179    Have you had a dilated eye exam in the past 12 months? Yes  Have you had a dental exam in the past 12 months? No    Are you checking your feet? Yes    How many days per week are you checking your feet? 7      Dietary Intake   Breakfast bacon, egg, and cheese biscuit OR honey nut cheerios, 2% milk, fruit    Lunch rotissorie chicken sandwich on [employer] bread, sweet potato chips    Dinner fried seafood, collard greens (out to eat) OR spaghetti, spinach, peppers, onions OR meat, vegetable, starch OR hamburger soup    Snack (evening) occasional popcorn or nuts    Beverage(s) water, sugar free flavor packs, half a half sweet/unsweet tea, coffee  with splenda, occasional Coke zero, rare 1/2 regular Pepsi      Exercise   Exercise Type ADL's      Patient Education   Previous Diabetes Education Yes (please comment)   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    Physical activity and exercise  Role of exercise on diabetes management, blood pressure control and cardiac health.    Medications Reviewed patients medication for diabetes, action, purpose, timing of dose and side effects.    Monitoring Taught/discussed recording of test results and interpretation of SMBG.;Identified appropriate SMBG and/or A1C goals.;Daily foot exams;Yearly dilated eye exam    Acute complications Taught treatment of hypoglycemia - the 15 rule.    Chronic complications Relationship between chronic complications and blood glucose control    Psychosocial adjustment Worked with patient to identify barriers to care and solutions      Individualized Goals (developed by patient)   Nutrition General guidelines for healthy choices and portions discussed    Physical Activity Exercise 3-5 times per week;30 minutes per day    Medications take my medication as prescribed    Monitoring  test my blood glucose as discussed      Post-Education Assessment   Patient understands the diabetes disease and treatment process. Demonstrates understanding / competency    Patient understands incorporating nutritional management into lifestyle. Needs Review    Patient undertands incorporating physical activity into lifestyle. Needs Review    Patient  understands using medications safely. Demonstrates understanding / competency    Patient understands monitoring blood glucose, interpreting and using results Demonstrates understanding / competency    Patient understands prevention, detection, and treatment of acute complications. Demonstrates understanding / competency    Patient understands prevention, detection, and treatment of chronic complications. Demonstrates understanding / competency    Patient understands how to develop strategies to address psychosocial issues. Demonstrates understanding / competency    Patient understands how to develop strategies to promote health/change behavior. Needs Review      Outcomes   Expected Outcomes Demonstrated interest in learning. Expect positive outcomes    Future DMSE PRN    Program Status Completed           Individualized Plan for Diabetes Self-Management Training:   Learning Objective:  Patient will have a greater understanding of diabetes self-management. Patient education plan is to attend individual and/or group sessions per assessed needs and concerns.  Plan:   Patient Instructions  Consider alternative sweeteners such as stevia. Rethink what you drink.  Aim for beverages without carbohydrates. Buy a new crock pot. How can you incorporate exercise into your day?  Walk during lunch.   Expected Outcomes:  Demonstrated interest in learning. Expect positive outcomes  Education material provided: ADA - How to Thrive: A Guide for Your Journey with Diabetes and Meal plan card; eating out tips, snack ideas with focus on low carb options if hungry; types of fat  If problems or questions, patient to contact team via:  Phone and Email  Future DSME appointment: PRN

## 2020-01-08 ENCOUNTER — Ambulatory Visit: Payer: 59 | Admitting: Dietician

## 2020-05-01 ENCOUNTER — Other Ambulatory Visit: Payer: Self-pay | Admitting: Family Medicine

## 2020-05-01 DIAGNOSIS — Z1231 Encounter for screening mammogram for malignant neoplasm of breast: Secondary | ICD-10-CM

## 2020-05-27 ENCOUNTER — Telehealth: Payer: Self-pay | Admitting: *Deleted

## 2020-05-27 MED ORDER — HYDROCODONE-HOMATROPINE 5-1.5 MG/5ML PO SYRP
5.0000 mL | ORAL_SOLUTION | Freq: Three times a day (TID) | ORAL | 0 refills | Status: DC | PRN
Start: 2020-05-27 — End: 2020-05-31

## 2020-05-27 NOTE — Telephone Encounter (Signed)
Patient called stating that she started with covid symptoms on Tuesday and tested positive at [facility] Wednesday. Patient stated that she has had a productive cough/yellow. Patient denies a fever, SOB or difficulty breathing. Patient stated that she had covid back the end of 2020. Patient stated when she had covid in 2020 Dr.Tower gave her Hycodan cough medication. Patient requested that [name] send her in a script for the cough medication again. Patient stated that her cough is real bad especially at night. Patient was given ER precautions and she verbalized understanding [facility]acy [facility],

## 2020-05-27 NOTE — Telephone Encounter (Signed)
Please schedule virtual visit when able  I refilled cough medicine- make her aware, some pharmacies have not been able to get it and will have to let me know tomorrow if so Thanks

## 2020-05-28 MED ORDER — PROMETHAZINE-DM 6.25-15 MG/5ML PO SYRP: 5.0000 mL | ORAL_SOLUTION | Freq: Four times a day (QID) | ORAL | 0 refills | Status: DC | PRN

## 2020-05-28 NOTE — Telephone Encounter (Signed)
Patient left a voicemail requesting a call back. 

## 2020-05-28 NOTE — Telephone Encounter (Signed)
Patient wants to know if [name] will give her a back to work note when she feels that she is able to go back or does she need a virtual visit.

## 2020-05-28 NOTE — Telephone Encounter (Signed)
Pt notified of Dr. Marliss Coots comments and new Rx sent to pharmacy, pt will keep [place] posted

## 2020-05-28 NOTE — Telephone Encounter (Signed)
Needs a virtual visit.  

## 2020-05-28 NOTE — Telephone Encounter (Signed)
Just received fax say Rx is on back order and they requested PCP to send in an alt med

## 2020-05-28 NOTE — Telephone Encounter (Signed)
I pended prometh Dm- I think she has had it before  Send if she is ok with that  Otherwise let me know

## 2020-05-31 ENCOUNTER — Other Ambulatory Visit: Payer: Self-pay

## 2020-05-31 ENCOUNTER — Telehealth (INDEPENDENT_AMBULATORY_CARE_PROVIDER_SITE_OTHER): Payer: 59 | Admitting: Family Medicine

## 2020-05-31 ENCOUNTER — Encounter: Payer: Self-pay | Admitting: Family Medicine

## 2020-05-31 DIAGNOSIS — U071 COVID-19: Secondary | ICD-10-CM

## 2020-05-31 NOTE — Progress Notes (Signed)
Virtual Visit via Video Note  I connected with Krista Newton on 05/31/20 at 11:00 AM EST by a video enabled telemedicine application and verified that I am speaking with the correct person using two identifiers.  Location: Patient: home Provider: office   I discussed the limitations of evaluation and management by telemedicine and the availability of in person appointments. The patient expressed understanding and agreed to proceed.  Parties involved in encounter  Patient: Krista Newton   Provider:  Roxy Manns MD   History of Present Illness: Pt presents with covid 19   Had covid symptoms on Tuesday and tested pos on wed ([facility])  Still a little cough with yellow phlegm  Improved a bit   Started symptoms 4th jan   No sob or wheezing  No fever either   Did not loose taste /smell  No diarrhea or GI issues   Vaccine status  [employer] in April   otc :  Took tylenol  Took off brand of nyquil cough/cold on first night-did not help  Uses sugar free lozenges   Her pharmacy is out of cough medicine   Patient Active Problem List   Diagnosis Date Noted  . Dyslipidemia 12/08/2019  . Need for hepatitis C screening test 05/15/2019  . Encounter for screening for HIV 05/15/2019  . Screening-pulmonary TB 05/15/2019  . Colon cancer screening 05/15/2019  . COVID-19 05/08/2019  . Vitamin D deficiency 11/22/2018  . Numbness and tingling sensation of skin 10/04/2018  . Neck pain 08/18/2017  . Productive cough 08/18/2017  . Exposure to communicable disease 01/19/2016  . Morbid obesity (HCC) 07/07/2015  . Essential hypertension 07/05/2015  . Hyperlipidemia associated with type 2 diabetes mellitus (HCC) 08/31/2014  . Type 2 diabetes mellitus with hyperglycemia, without long-term current use of insulin (HCC) 02/15/2014  . Leg cramps 02/06/2014  . Dry mouth 02/06/2014  . History of shingles 09/18/2013  . Carpal tunnel syndrome 05/31/2013  . Routine general medical examination at  a health care facility 07/04/2012  . HEARING LOSS, RIGHT EAR 07/26/2008  . ALLERGIC RHINITIS 06/28/2008   Past Medical History:  Diagnosis Date  . Allergy   . Anemia    with pregnancy   . COVID-19 virus infection 04/17/2019  . Diabetes mellitus without complication (HCC)   . Hyperlipidemia   . Hypertension    Past Surgical History:  Procedure Laterality Date  . CARPAL TUNNEL RELEASE Bilateral 04/24/2014  . CESAREAN SECTION    . TUBAL LIGATION     Social History   Tobacco Use  . Smoking status: Never Smoker  . Smokeless tobacco: Never Used  Vaping Use  . Vaping Use: Never used  Substance Use Topics  . Alcohol use: Yes    Alcohol/week: 0.0 standard drinks    Comment: Rare  . Drug use: No   Family History  Problem Relation Age of Onset  . Hypertension Mother   . Diabetes Mother   . Heart failure Mother   . Hypertension Father   . Diabetes Sister   . Kidney failure Brother   . Colon cancer Neg Hx   . Colon polyps Neg Hx   . Esophageal cancer Neg Hx   . Rectal cancer Neg Hx   . Stomach cancer Neg Hx    Allergies  Allergen Reactions  . Metformin And Related Other (See Comments)    The XR caused sore throat, HA, GI issues  . Jardiance [Empagliflozin] Other (See Comments)    Yeast infections   Current  Outpatient Medications on File Prior to Visit  Medication Sig Dispense Refill  . Blood Glucose Monitoring Suppl (ONE TOUCH ULTRA 2) w/Device KIT 1 Device by Does not apply route as directed. 1 each 0  . cholecalciferol (VITAMIN D3) 25 MCG (1000 UNIT) tablet Take 1,000 Units by mouth daily.    Marland Kitchen glipiZIDE (GLUCOTROL XL) 10 MG 24 hr tablet Take 1 tablet (10 mg total) by mouth 2 (two) times daily with a meal. 180 tablet 3  . glucose blood (ONE TOUCH ULTRA TEST) test strip TEST SUGAR TWICE A DAY(dx. E11.9) 100 each 3  . hydrochlorothiazide (HYDRODIURIL) 25 MG tablet Take 1 tablet (25 mg total) by mouth every other day. 15 tablet 5  . Lancets (ONETOUCH ULTRASOFT) lancets  Twice daily 100 each 12  . naproxen sodium (ALEVE) 220 MG tablet Take 220 mg by mouth 2 (two) times daily as needed.    . promethazine-dextromethorphan (PROMETHAZINE-DM) 6.25-15 MG/5ML syrup Take 5 mLs by mouth 4 (four) times daily as needed for cough. 118 mL 0  . rosuvastatin (CRESTOR) 5 MG tablet Take 1 tablet (5 mg total) by mouth as directed. Three times a week 90 tablet 1   No current facility-administered medications on file prior to visit.   Review of Systems  Constitutional: Negative for chills, fever and malaise/fatigue.  HENT: Negative for congestion, ear pain, sinus pain and sore throat.   Eyes: Negative for blurred vision, discharge and redness.  Respiratory: Positive for cough and sputum production. Negative for shortness of breath and stridor.   Cardiovascular: Negative for chest pain, palpitations and leg swelling.  Gastrointestinal: Negative for abdominal pain, diarrhea, nausea and vomiting.  Musculoskeletal: Negative for myalgias.  Skin: Negative for rash.  Neurological: Negative for dizziness and headaches.    Observations/Objective: Patient appears well, in no distress Weight is baseline  No facial swelling or asymmetry Normal voice-not hoarse and no slurred speech No obvious tremor or mobility impairment Moving neck and UEs normally Able to hear the call well  No cough or shortness of breath during interview  Pt does occ clear her throat  Talkative and mentally sharp with no cognitive changes No skin changes on face or neck , no rash or pallor Affect is normal    Assessment and Plan: Problem List Items Addressed This Visit      Other   COVID-19    Day 10 of symptoms relatively mild  If feeling better ca return to work on Monday 1/17 Disc cough control (pharmacy is out of px cough med)  Delsym or expectorant with Dm prn otc  Lozenges Fluids  Er parameters noted Update if not starting to improve in a week or if worsening            Follow Up  Instructions: Drink fluids and rest  You can try delsym or mucinex dm or robitussin dm for cough  If worse or any shortness of breath let us know (if severe go to the ER)   I wrote a note to return to work Monday as long as you feel better    I discussed the assessment and treatment plan with the patient. The patient was provided an opportunity to ask questions and all were answered. The patient agreed with the plan and demonstrated an understanding of the instructions.   The patient was advised to call back or seek an in-person evaluation if the symptoms worsen or if the condition fails to improve as anticipated.  Loura Pardon,  MD   

## 2020-05-31 NOTE — Patient Instructions (Signed)
Drink fluids and rest  You can try delsym or mucinex dm or robitussin dm for cough  If worse or any shortness of breath let us know (if severe go to the ER)   I wrote a note to return to work Monday as long as you feel better

## 2020-05-31 NOTE — Assessment & Plan Note (Signed)
Day 10 of symptoms relatively mild  If feeling better ca return to work on Monday 1/17 Disc cough control (pharmacy is out of px cough med)  Delsym or expectorant with Dm prn otc  Lozenges Fluids  Er parameters noted Update if not starting to improve in a week or if worsening

## 2020-06-11 ENCOUNTER — Other Ambulatory Visit: Payer: Self-pay | Admitting: Internal Medicine

## 2020-06-12 ENCOUNTER — Other Ambulatory Visit: Payer: Self-pay

## 2020-06-12 ENCOUNTER — Ambulatory Visit
Admission: RE | Admit: 2020-06-12 | Discharge: 2020-06-12 | Disposition: A | Discharge status: Home / Self Care | Payer: 59 | Source: Ambulatory Visit | Attending: Family Medicine | Admitting: Family Medicine

## 2020-06-12 DIAGNOSIS — Z1231 Encounter for screening mammogram for malignant neoplasm of breast: Secondary | ICD-10-CM

## 2020-06-14 ENCOUNTER — Encounter: Payer: Self-pay | Admitting: Internal Medicine

## 2020-06-14 ENCOUNTER — Other Ambulatory Visit: Payer: Self-pay

## 2020-06-14 ENCOUNTER — Ambulatory Visit: Payer: 59 | Admitting: Internal Medicine

## 2020-06-14 VITALS — BP 150/90 | HR 90 | Ht 64.0 in | Wt 242.5 lb

## 2020-06-14 DIAGNOSIS — E1165 Type 2 diabetes mellitus with hyperglycemia: Secondary | ICD-10-CM | POA: Diagnosis not present

## 2020-06-14 DIAGNOSIS — E785 Hyperlipidemia, unspecified: Secondary | ICD-10-CM

## 2020-06-14 LAB — POCT GLYCOSYLATED HEMOGLOBIN (HGB A1C): Hemoglobin A1C: 7.4 % — AB (ref 4.0–5.6)

## 2020-06-14 LAB — POCT GLUCOSE (DEVICE FOR HOME USE): POC Glucose: 211 mg/dl — AB (ref 70–99)

## 2020-06-14 MED ORDER — GLIPIZIDE 10 MG PO TABS: 10.0000 mg | ORAL_TABLET | Freq: Two times a day (BID) | ORAL | 3 refills | Status: DC

## 2020-06-14 MED ORDER — DAPAGLIFLOZIN PROPANEDIOL 5 MG PO TABS: 5.0000 mg | ORAL_TABLET | Freq: Every day | ORAL | 4 refills | Status: DC

## 2020-06-14 NOTE — Progress Notes (Signed)
Name: Krista Newton  Age/ Sex: 55 y.o., female   MRN/ DOB: [id number], June 23, 1965     PCP: Abner Greenspan, MD   Reason for Endocrinology Evaluation: Type 2 Diabetes Mellitus     Initial Endocrinology Clinic Visit: 08/01/2018      PATIENT IDENTIFIER: Krista Newton is a 55 y.o. female with a past medical history of HTN and T2DM. The patient has followed with Endocrinology clinic since 08/01/2018 for consultative assistance with management of her diabetes.  DIABETIC HISTORY:  Krista Newton was diagnosed with T2DM in 2015. She reports intolerance to Metformin (Throat chemical burn ) . Her hemoglobin A1c has ranged from 7.3% in 2017, peaking at 10.0% in 2020.  On her initial visit to our clinic her A1c was 10.0 % and was on Glipizide.   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       Today (06/14/2020): Krista Newton is here for a   follow up on diabetes management. She checks her blood sugars 2 times daily, preprandial to breakfast and supper. The patient has not had hypoglycemic episodes since the last clinic visit.   Has COVID infection earlier in the month    HOME ENDOCRINE REGIMEN:  Glipizide XL 10 mg BID  Rosuvastatin 5 mg daily - uses it occasionally     GLUCOSE LOG :  112- 238 mg/dL   DIABETIC COMPLICATIONS: Microvascular complications:    Denies: CKD, neuropathy, retinopathy  Last eye exam: Completed 12/2018   Macrovascular complications:    Denies: CAD, PVD, CVA   HISTORY:  Past Medical History:  Past Medical History:  Diagnosis Date  . Allergy   . Anemia    with pregnancy   . COVID-19 virus infection 04/17/2019  . Diabetes mellitus without complication ([place])   . Hyperlipidemia   . Hypertension    Past Surgical History:  Past Surgical History:  Procedure Laterality Date  .  CARPAL TUNNEL RELEASE Bilateral 04/24/2014  . CESAREAN SECTION    . TUBAL LIGATION      Social History:  reports that she has never smoked. She has never used smokeless tobacco. She reports current alcohol use. She reports that she does not use drugs. Family History:  Family History  Problem Relation Age of Onset  . Hypertension Mother   . Diabetes Mother   . Heart failure Mother   . Hypertension Father   . Diabetes Sister   . Kidney failure Brother   . Colon cancer Neg Hx   . Colon polyps Neg Hx   . Esophageal cancer Neg Hx   . Rectal cancer Neg Hx   . Stomach cancer Neg Hx      HOME MEDICATIONS: Allergies as of 06/14/2020      Reactions   Metformin And Related Other (See Comments)   The XR caused sore throat, HA, GI issues   Jardiance [empagliflozin] Other (See Comments)   Yeast infections      Medication List       Accurate as of June 14, 2020  4:30 PM. If you have any questions, ask your nurse or doctor.        STOP taking these medications   glipiZIDE 10 MG 24 hr tablet Commonly known as: GLUCOTROL XL Replaced by: glipiZIDE 10 MG tablet Stopped by:  Dorita Sciara, MD     TAKE these medications   cholecalciferol 25 MCG (1000 UNIT) tablet Commonly known as: VITAMIN D3 Take 1,000 Units by mouth daily.   dapagliflozin propanediol 5 MG Tabs tablet Commonly known as: Farxiga Take 1 tablet (5 mg total) by mouth daily before breakfast. Started by: Dorita Sciara, MD   glipiZIDE 10 MG tablet Commonly known as: GLUCOTROL Take 1 tablet (10 mg total) by mouth 2 (two) times daily before a meal. Replaces: glipiZIDE 10 MG 24 hr tablet Started by: Dorita Sciara, MD   hydrochlorothiazide 25 MG tablet Commonly known as: HYDRODIURIL Take 1 tablet (25 mg total) by mouth every other day.   naproxen sodium 220 MG tablet Commonly known as: ALEVE Take 220 mg by mouth 2 (two) times daily as needed.   ONE TOUCH ULTRA 2 w/Device Kit 1 Device by  Does not apply route as directed.   OneTouch Ultra test strip Generic drug: glucose blood TEST SUGAR TWICE A DAY   onetouch ultrasoft lancets Twice daily   promethazine-dextromethorphan 6.25-15 MG/5ML syrup Commonly known as: PROMETHAZINE-DM Take 5 mLs by mouth 4 (four) times daily as needed for cough.   rosuvastatin 5 MG tablet Commonly known as: Crestor Take 1 tablet (5 mg total) by mouth as directed. Three times a week       VITAL SIGNS: BP (!) 150/90   Pulse 90   Ht 5' 4"  (1.626 m)   Wt 242 lb 8 oz (110 kg)   SpO2 98%   BMI 41.63 kg/m    PHYSICAL EXAM:  General: Pt appears well and is in NAD  Neck: Thyroid: Thyroid size normal.  No goiter or nodules appreciated. No thyroid bruit.  Lungs: Clear with good BS bilat with no rales, rhonchi, or wheezes  Heart: RRR with normal S1 and S2 and no gallops; no murmurs; no rub  Abdomen: Normoactive bowel sounds, soft, nontender, without masses or organomegaly palpable  Extremities:  Lower extremities - No pretibial edema.   Neuro: MS is good with appropriate affect, pt is alert and Ox3    DM foot exam: 12/08/2019 The skin of the feet is intact without sores or ulcerations. The pedal pulses are 2+ on right and 2+ on left. The sensation is intact to a screening 5.07, 10 gram monofilament bilaterally    DATA REVIEWED:  Lab Results  Component Value Date   HGBA1C 7.4 (A) 06/14/2020   HGBA1C 7.3 (A) 12/08/2019   HGBA1C 7.4 (A) 11/21/2018   Lab Results  Component Value Date   MICROALBUR <0.7 04/24/2016   LDLCALC 182 (H) 05/15/2019   CREATININE 0.68 05/15/2019    ASSESSMENT / PLAN / RECOMMENDATIONS:   1) Type 2 Diabetes Mellitus, Sub-OPtimally controlled, Without complications - Most recent A1c of 7.4 %. Goal A1c < 7.0 %. Down from 10.0%     - A1c stable, but above goal  - Intolerant to Metformin - Developed UTI to jardiance in 2020 would like to retry but will prescribe Farxiga  - I will change Glipizide from  extended release to regular release as below    MEDICATIONS:  - Stop Glipizide 10 XL  - Start Glipizide 10 mg, 1 tablet before Breakfast and 1 tablet before Supper - Start Farxiga 5 mg, 1 tablet before Breakfast    EDUCATION / INSTRUCTIONS:  BG monitoring instructions: Patient is instructed to check her blood sugars 2 times a day, fasting and supper.  Call [facility] if: BG persistently <  70  . I reviewed the Rule of 15 for the treatment of hypoglycemia in detail with the patient. Literature supplied.  2) Diabetic complications:   Eye: Does not have known diabetic retinopathy.   Neuro/ Feet: Does not have known diabetic peripheral neuropathy.  Renal: Patient does not have known baseline CKD. She is not on an ACEI/ARB at present.   3) Dyslipidemia :  - Intolerant to daily rosuvastatin due to myalgia,she has not been compliant with  3x weekly  Of rosuvastatin, I offered switching to PCSK-9 inhibitors, discussed cardiovascular benefits.  - Pt would like to postpone this until next visit    Medications Rosuvastatin 5 mg , three times a week     F/U in 4 months    Signed electronically by: Mack Guise, MD  [facility]  [facility] [address] [place], [place] [address] Phone: [phone] FAX: [phone]   CC: Tower, Wynelle Fanny, MD [place] [address] Phone: [phone]  Fax: [phone]  Return to Endocrinology clinic as below: No future appointments.

## 2020-06-14 NOTE — Patient Instructions (Addendum)
-   Stop Glipizide 10 XL  - Start Glipizide 10 mg, 1 tablet before Breakfast and 1 tablet before Supper - Start Farxiga 5 mg, 1 tablet before Breakfast  - Take crestor 5 mg Three times a week      HOW TO TREAT LOW BLOOD SUGARS (Blood sugar LESS THAN 70 MG/DL)  Please follow the RULE OF 15 for the treatment of hypoglycemia treatment (when your (blood sugars are less than 70 mg/dL)    STEP 1: Take 15 grams of carbohydrates when your blood sugar is low, which includes:   3-4 GLUCOSE TABS  OR  3-4 OZ OF JUICE OR REGULAR SODA OR  ONE TUBE OF GLUCOSE GEL     STEP 2: RECHECK blood sugar in 15 MINUTES STEP 3: If your blood sugar is still low at the 15 minute recheck --> then, go back to STEP 1 and treat AGAIN with another 15 grams of carbohydrates.

## 2020-06-17 ENCOUNTER — Encounter: Payer: Self-pay | Admitting: Internal Medicine

## 2020-06-17 MED ORDER — EMPAGLIFLOZIN 10 MG PO TABS: 10.0000 mg | ORAL_TABLET | Freq: Every day | ORAL | 6 refills | Status: DC

## 2020-06-24 ENCOUNTER — Encounter: Payer: Self-pay | Admitting: Internal Medicine

## 2020-06-24 DIAGNOSIS — E1165 Type 2 diabetes mellitus with hyperglycemia: Secondary | ICD-10-CM

## 2020-06-25 MED ORDER — ONETOUCH VERIO REFLECT W/DEVICE KIT: PACK | 0 refills | Status: AC

## 2020-06-26 ENCOUNTER — Telehealth: Payer: Self-pay | Admitting: Internal Medicine

## 2020-06-26 MED ORDER — ONETOUCH VERIO VI STRP: ORAL_STRIP | 12 refills | Status: DC

## 2020-06-26 NOTE — Telephone Encounter (Signed)
Pt called because she received her new meter machine but it did not have a prescription for new test strips. Pt requests a prescription for test strips for her One Touch meter  [id number] [place], [place] - [address] [place] Phone:  [phone]  Fax:  [phone]

## 2020-06-26 NOTE — Telephone Encounter (Signed)
Sent as requested.

## 2020-06-28 MED ORDER — ONETOUCH VERIO VI STRP: ORAL_STRIP | 12 refills | Status: DC

## 2020-06-28 NOTE — Addendum Note (Signed)
Addended by: Jacqualin Combes on: 06/28/2020 07:59 AM   Modules accepted: Orders

## 2020-10-05 ENCOUNTER — Other Ambulatory Visit: Payer: Self-pay | Admitting: Family Medicine

## 2020-10-07 NOTE — Telephone Encounter (Signed)
No recent or future f/u or CPE appts., please advise  

## 2020-10-07 NOTE — Telephone Encounter (Signed)
HCTZ refilled once and message routed to [facility] to f/u with pt regarding appt

## 2020-10-07 NOTE — Telephone Encounter (Signed)
Please schedule f/u when able and refill until then.  Summer is ok

## 2020-10-11 ENCOUNTER — Other Ambulatory Visit: Payer: Self-pay

## 2020-10-11 ENCOUNTER — Encounter: Payer: Self-pay | Admitting: Internal Medicine

## 2020-10-11 ENCOUNTER — Ambulatory Visit (INDEPENDENT_AMBULATORY_CARE_PROVIDER_SITE_OTHER): Payer: 59 | Admitting: Internal Medicine

## 2020-10-11 VITALS — BP 136/86 | HR 84 | Ht 64.0 in | Wt 232.4 lb

## 2020-10-11 DIAGNOSIS — E559 Vitamin D deficiency, unspecified: Secondary | ICD-10-CM | POA: Diagnosis not present

## 2020-10-11 DIAGNOSIS — E785 Hyperlipidemia, unspecified: Secondary | ICD-10-CM

## 2020-10-11 DIAGNOSIS — E1165 Type 2 diabetes mellitus with hyperglycemia: Secondary | ICD-10-CM | POA: Diagnosis not present

## 2020-10-11 LAB — LIPID PANEL
Cholesterol: 271 mg/dL — ABNORMAL HIGH (ref 0–200)
HDL: 58.4 mg/dL (ref >39.00)
LDL Cholesterol: 182 mg/dL — ABNORMAL HIGH (ref 0–99)
NonHDL: 212.86
Total CHOL/HDL Ratio: 5
Triglycerides: 156 mg/dL — ABNORMAL HIGH (ref 0.0–149.0)
VLDL: 31.2 mg/dL (ref 0.0–40.0)

## 2020-10-11 LAB — MICROALBUMIN / CREATININE URINE RATIO
Creatinine,U: 115.3 mg/dL
Microalb Creat Ratio: 1.3 mg/g (ref 0.0–30.0)
Microalb, Ur: 1.5 mg/dL (ref 0.0–1.9)

## 2020-10-11 LAB — BASIC METABOLIC PANEL
BUN: 19 mg/dL (ref 6–23)
CO2: 26 mEq/L (ref 19–32)
Calcium: 10.1 mg/dL (ref 8.4–10.5)
Chloride: 99 mEq/L (ref 96–112)
Creatinine, Ser: 0.69 mg/dL (ref 0.40–1.20)
GFR: 96.83 mL/min (ref >60.00)
Glucose, Bld: 119 mg/dL — ABNORMAL HIGH (ref 70–99)
Potassium: 4.1 mEq/L (ref 3.5–5.1)
Sodium: 135 mEq/L (ref 135–145)

## 2020-10-11 LAB — POCT GLYCOSYLATED HEMOGLOBIN (HGB A1C): Hemoglobin A1C: 6.7 % — AB (ref 4.0–5.6)

## 2020-10-11 LAB — POCT GLUCOSE (DEVICE FOR HOME USE): POC Glucose: 135 mg/dl — AB (ref 70–99)

## 2020-10-11 LAB — VITAMIN D 25 HYDROXY (VIT D DEFICIENCY, FRACTURES): VITD: 20.14 ng/mL — ABNORMAL LOW (ref 30.00–100.00)

## 2020-10-11 LAB — TSH: TSH: 2.05 u[IU]/mL (ref 0.35–4.50)

## 2020-10-11 MED ORDER — ROSUVASTATIN CALCIUM 5 MG PO TABS: 5.0000 mg | ORAL_TABLET | ORAL | 1 refills | Status: DC

## 2020-10-11 MED ORDER — EMPAGLIFLOZIN 10 MG PO TABS: 10.0000 mg | ORAL_TABLET | Freq: Every day | ORAL | 6 refills | Status: DC

## 2020-10-11 MED ORDER — GLIPIZIDE 10 MG PO TABS: 10.0000 mg | ORAL_TABLET | Freq: Two times a day (BID) | ORAL | 3 refills | Status: DC

## 2020-10-11 NOTE — Telephone Encounter (Signed)
Spoke with patient scheduled CPE with labs

## 2020-10-11 NOTE — Progress Notes (Signed)
Name: Krista Newton  Age/ Sex: 55 y.o., female   MRN/ DOB: [id number], 05/10/1966     PCP: Abner Greenspan, MD   Reason for Endocrinology Evaluation: Type 2 Diabetes Mellitus     Initial Endocrinology Clinic Visit: 08/01/2018      PATIENT IDENTIFIER: Krista Newton is a 55 y.o. female with a past medical history of HTN and T2DM. The patient has followed with Endocrinology clinic since 08/01/2018 for consultative assistance with management of her diabetes.  DIABETIC HISTORY:  [name] was diagnosed with T2DM in 2015. She reports intolerance to Metformin (Throat chemical burn ) . Her hemoglobin A1c has ranged from 7.3% in 2017, peaking at 10.0% in 2020.  On her initial visit to our clinic her A1c was 10.0 % and was on Glipizide.   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 Kobler is here for a   follow up on diabetes management. She checks her blood sugars 2 times daily, preprandial to breakfast and supper. The patient has not had hypoglycemic episodes since the last clinic visit.   She eats  dinner late  She is still having right leg crawling feeling. Initially vitamin D helped but she believes OTC Vitamin D 3 1000 iu daily   HOME ENDOCRINE REGIMEN:  Glipizide 10 mg BID  Jardiance 10 mg, 1 tablet before Breakfast  Rosuvastatin 5 mg three times a week     GLUCOSE LOG :  104- 194  mg/dL   DIABETIC COMPLICATIONS: Microvascular complications:    Denies: CKD, neuropathy, retinopathy  Last eye exam: Completed 12/2019   Macrovascular complications:    Denies: CAD, PVD, CVA   HISTORY:  Past Medical History:  Past Medical History:  Diagnosis Date  . Allergy   . Anemia    with pregnancy   . COVID-19 virus infection 04/17/2019  . Diabetes mellitus  without complication ([place])   . Hyperlipidemia   . Hypertension    Past Surgical History:  Past Surgical History:  Procedure Laterality Date  . CARPAL TUNNEL RELEASE Bilateral 04/24/2014  . CESAREAN SECTION    . TUBAL LIGATION      Social History:  reports that she has never smoked. She has never used smokeless tobacco. She reports current alcohol use. She reports that she does not use drugs. Family History:  Family History  Problem Relation Age of Onset  . Hypertension Mother   . Diabetes Mother   . Heart failure Mother   . Hypertension Father   . Diabetes Sister   . Kidney failure Brother   . Colon cancer Neg Hx   . Colon polyps Neg Hx   . Esophageal cancer Neg Hx   . Rectal cancer Neg Hx   . Stomach cancer Neg Hx      HOME MEDICATIONS: Allergies as of 10/11/2020      Reactions   Metformin And Related Other (See Comments)   The XR caused sore throat, HA, GI issues      Medication List       Accurate as of Oct 11, 2020  3:51 PM. If you have any questions, ask your nurse or doctor.        cholecalciferol 25 MCG (1000 UNIT) tablet Commonly known as: VITAMIN D3  Take 1,000 Units by mouth daily.   empagliflozin 10 MG Tabs tablet Commonly known as: Jardiance Take 1 tablet (10 mg total) by mouth daily before breakfast.   glipiZIDE 10 MG tablet Commonly known as: GLUCOTROL Take 1 tablet (10 mg total) by mouth 2 (two) times daily before a meal.   hydrochlorothiazide 25 MG tablet Commonly known as: HYDRODIURIL TAKE 1 TABLET BY MOUTH EVERY OTHER DAY   naproxen sodium 220 MG tablet Commonly known as: ALEVE Take 220 mg by mouth 2 (two) times daily as needed.   onetouch ultrasoft lancets Twice daily   OneTouch Verio Reflect w/Device Kit Use to check blood sugar 2 times a day   OneTouch Verio test strip Generic drug: glucose blood Use as instructed to check blood sugar once daily   promethazine-dextromethorphan 6.25-15 MG/5ML syrup Commonly known as:  PROMETHAZINE-DM Take 5 mLs by mouth 4 (four) times daily as needed for cough.   rosuvastatin 5 MG tablet Commonly known as: Crestor Take 1 tablet (5 mg total) by mouth as directed. Three times a week       VITAL SIGNS: BP 136/86   Pulse 84   Ht $R'5\' 4"'oG$  (1.626 m)   Wt 232 lb 6 oz (105.4 kg)   SpO2 98%   BMI 39.89 kg/m    PHYSICAL EXAM:  General: Pt appears well and is in NAD  Neck: Thyroid: Thyroid size normal.  No goiter or nodules appreciated. No thyroid bruit.  Lungs: Clear with good BS bilat with no rales, rhonchi, or wheezes  Heart: RRR with normal S1 and S2 and no gallops; no murmurs; no rub  Abdomen: Normoactive bowel sounds, soft, nontender, without masses or organomegaly palpable  Extremities:  Lower extremities - No pretibial edema.   Neuro: MS is good with appropriate affect, pt is alert and Ox3    DM foot exam: 10/11/2020  The skin of the feet is intact without sores or ulcerations. The pedal pulses are 2+ on right and 2+ on left. The sensation is intact to a screening 5.07, 10 gram monofilament bilaterally    DATA REVIEWED:  Lab Results  Component Value Date   HGBA1C 6.7 (A) 10/11/2020   HGBA1C 7.4 (A) 06/14/2020   HGBA1C 7.3 (A) 12/08/2019   Lab Results  Component Value Date   MICROALBUR <0.7 04/24/2016   LDLCALC 182 (H) 05/15/2019   CREATININE 0.68 05/15/2019    ASSESSMENT / PLAN / RECOMMENDATIONS:   1) Type 2 Diabetes Mellitus, OPtimally controlled, Without complications - Most recent A1c of 6.7 %. Goal A1c < 7.0 %. Down from 10.0%     - Praised the pt on optimal glycemic control  - Intolerant to Metformin - Developed UTI to [place] in 2020 , but currently tolerating well the small dose    MEDICATIONS:  - Continue Glipizide 10 mg, 1 tablet before Breakfast and 1 tablet before Supper - Continue  mg, 1 tablet before Breakfast    EDUCATION / INSTRUCTIONS:  BG monitoring instructions: Patient is instructed to check her blood sugars  2 times a day, fasting and supper.  Call [facility] if: BG persistently < 70  . I reviewed the Rule of 15 for the treatment of hypoglycemia in detail with the patient. Literature supplied.  2) Diabetic complications:   Eye: Does not have known diabetic retinopathy.   Neuro/ Feet: Does not have known diabetic peripheral neuropathy.  Renal: Patient does not have known baseline CKD. She is not on an ACEI/ARB at present.  3) Dyslipidemia :  - Intolerant to daily rosuvastatin due to myalgia,she has not been compliant with  3x weekly  Of rosuvastatin, I offered switching to PCSK-9 inhibitors, discussed cardiovascular benefits.  - Tolerating current dose of rosuvastatin , but LDL continues to be high, will try increasing to 4 days a week, if intolerant to that, will add Zetia    Medications Increase Rosuvastatin 5 mg , four  times a week   4) Vitamin D deficiency :  - This was as low as 10.58 in  2020 . Currentl OTC is not sufficient at 1000 iu daily. Will restart Ergocalciferol for 3 months follow by OTC VItamin D 2000 iu daily     Start Ergocalciferol 50000 iu weekly     F/U in 6  months    Signed electronically by: Mack Guise, MD  [facility]  [facility] [address] [place], [place] [address] Phone: [phone] FAX: [phone]   CC: Tower, Wynelle Fanny, MD [place] [address] Phone: [phone]  Fax: [phone]  Return to Endocrinology clinic as below: Future Appointments  Date Time Provider [place]  11/01/2020  8:20 AM [facility]  11/08/2020  9:00 AM Tower, Wynelle Fanny, MD [facility]

## 2020-10-11 NOTE — Patient Instructions (Signed)
-   Keep up the Good Work! - Continue Glipizide 10 mg, 1 tablet before Breakfast and 1 tablet before Supper - Continue Jardiance 10 mg, 1 tablet before Breakfast  - Take crestor 5 mg Three times a week      HOW TO TREAT LOW BLOOD SUGARS (Blood sugar LESS THAN 70 MG/DL)  Please follow the RULE OF 15 for the treatment of hypoglycemia treatment (when your (blood sugars are less than 70 mg/dL)    STEP 1: Take 15 grams of carbohydrates when your blood sugar is low, which includes:   3-4 GLUCOSE TABS  OR  3-4 OZ OF JUICE OR REGULAR SODA OR  ONE TUBE OF GLUCOSE GEL     STEP 2: RECHECK blood sugar in 15 MINUTES STEP 3: If your blood sugar is still low at the 15 minute recheck --> then, go back to STEP 1 and treat AGAIN with another 15 grams of carbohydrates.

## 2020-10-14 MED ORDER — VITAMIN D (ERGOCALCIFEROL) 1.25 MG (50000 UNIT) PO CAPS: 50000.0000 [IU] | ORAL_CAPSULE | ORAL | 0 refills | Status: DC

## 2020-10-14 MED ORDER — ROSUVASTATIN CALCIUM 5 MG PO TABS: 5.0000 mg | ORAL_TABLET | ORAL | 3 refills | Status: DC

## 2020-10-31 ENCOUNTER — Telehealth: Payer: Self-pay | Admitting: Family Medicine

## 2020-10-31 DIAGNOSIS — E559 Vitamin D deficiency, unspecified: Secondary | ICD-10-CM

## 2020-10-31 DIAGNOSIS — E785 Hyperlipidemia, unspecified: Secondary | ICD-10-CM

## 2020-10-31 DIAGNOSIS — E1165 Type 2 diabetes mellitus with hyperglycemia: Secondary | ICD-10-CM

## 2020-10-31 DIAGNOSIS — Z111 Encounter for screening for respiratory tuberculosis: Secondary | ICD-10-CM

## 2020-10-31 DIAGNOSIS — E1169 Type 2 diabetes mellitus with other specified complication: Secondary | ICD-10-CM

## 2020-10-31 DIAGNOSIS — I10 Essential (primary) hypertension: Secondary | ICD-10-CM

## 2020-10-31 NOTE — Telephone Encounter (Signed)
-----   Message from Ellamae Sia sent at 10/15/2020  2:22 PM EDT ----- Regarding: Lab orders for Friday, 6.17.22 Patient is scheduled for CPX labs, please order future labs, Thanks , Karna Christmas

## 2020-11-01 ENCOUNTER — Other Ambulatory Visit (INDEPENDENT_AMBULATORY_CARE_PROVIDER_SITE_OTHER): Payer: 59

## 2020-11-01 ENCOUNTER — Other Ambulatory Visit: Payer: Self-pay

## 2020-11-01 DIAGNOSIS — E1169 Type 2 diabetes mellitus with other specified complication: Secondary | ICD-10-CM

## 2020-11-01 DIAGNOSIS — E559 Vitamin D deficiency, unspecified: Secondary | ICD-10-CM

## 2020-11-01 DIAGNOSIS — E785 Hyperlipidemia, unspecified: Secondary | ICD-10-CM

## 2020-11-01 DIAGNOSIS — I10 Essential (primary) hypertension: Secondary | ICD-10-CM | POA: Diagnosis not present

## 2020-11-01 LAB — COMPREHENSIVE METABOLIC PANEL
ALT: 13 U/L (ref 0–35)
AST: 12 U/L (ref 0–37)
Albumin: 4.1 g/dL (ref 3.5–5.2)
Alkaline Phosphatase: 69 U/L (ref 39–117)
BUN: 18 mg/dL (ref 6–23)
CO2: 24 mEq/L (ref 19–32)
Calcium: 9.3 mg/dL (ref 8.4–10.5)
Chloride: 107 mEq/L (ref 96–112)
Creatinine, Ser: 0.69 mg/dL (ref 0.40–1.20)
GFR: 96.79 mL/min (ref >60.00)
Glucose, Bld: 126 mg/dL — ABNORMAL HIGH (ref 70–99)
Potassium: 4.2 mEq/L (ref 3.5–5.1)
Sodium: 139 mEq/L (ref 135–145)
Total Bilirubin: 0.5 mg/dL (ref 0.2–1.2)
Total Protein: 6.8 g/dL (ref 6.0–8.3)

## 2020-11-01 LAB — LIPID PANEL
Cholesterol: 233 mg/dL — ABNORMAL HIGH (ref 0–200)
HDL: 52.2 mg/dL (ref >39.00)
LDL Cholesterol: 154 mg/dL — ABNORMAL HIGH (ref 0–99)
NonHDL: 181.11
Total CHOL/HDL Ratio: 4
Triglycerides: 137 mg/dL (ref 0.0–149.0)
VLDL: 27.4 mg/dL (ref 0.0–40.0)

## 2020-11-01 LAB — TSH: TSH: 2.53 u[IU]/mL (ref 0.35–4.50)

## 2020-11-01 LAB — CBC WITH DIFFERENTIAL/PLATELET
Basophils Absolute: 0.1 10*3/uL (ref 0.0–0.1)
Basophils Relative: 1.1 % (ref 0.0–3.0)
Eosinophils Absolute: 0.2 10*3/uL (ref 0.0–0.7)
Eosinophils Relative: 2.8 % (ref 0.0–5.0)
HCT: 40.1 % (ref 36.0–46.0)
Hemoglobin: 13.7 g/dL (ref 12.0–15.0)
Lymphocytes Relative: 41.8 % (ref 12.0–46.0)
Lymphs Abs: 2.7 10*3/uL (ref 0.7–4.0)
MCHC: 34.1 g/dL (ref 30.0–36.0)
MCV: 89.6 fl (ref 78.0–100.0)
Monocytes Absolute: 0.5 10*3/uL (ref 0.1–1.0)
Monocytes Relative: 7.7 % (ref 3.0–12.0)
Neutro Abs: 3 10*3/uL (ref 1.4–7.7)
Neutrophils Relative %: 46.6 % (ref 43.0–77.0)
Platelets: 254 10*3/uL (ref 150.0–400.0)
RBC: 4.48 Mil/uL (ref 3.87–5.11)
RDW: 13.8 % (ref 11.5–15.5)
WBC: 6.5 10*3/uL (ref 4.0–10.5)

## 2020-11-01 LAB — VITAMIN D 25 HYDROXY (VIT D DEFICIENCY, FRACTURES): VITD: 17.58 ng/mL — ABNORMAL LOW (ref 30.00–100.00)

## 2020-11-08 ENCOUNTER — Other Ambulatory Visit: Payer: Self-pay

## 2020-11-08 ENCOUNTER — Ambulatory Visit (INDEPENDENT_AMBULATORY_CARE_PROVIDER_SITE_OTHER): Payer: 59 | Admitting: Family Medicine

## 2020-11-08 ENCOUNTER — Encounter: Payer: Self-pay | Admitting: Family Medicine

## 2020-11-08 VITALS — BP 134/82 | HR 82 | Temp 96.9°F | Ht 64.25 in | Wt 230.2 lb

## 2020-11-08 DIAGNOSIS — I10 Essential (primary) hypertension: Secondary | ICD-10-CM | POA: Diagnosis not present

## 2020-11-08 DIAGNOSIS — E1165 Type 2 diabetes mellitus with hyperglycemia: Secondary | ICD-10-CM | POA: Diagnosis not present

## 2020-11-08 DIAGNOSIS — E1169 Type 2 diabetes mellitus with other specified complication: Secondary | ICD-10-CM

## 2020-11-08 DIAGNOSIS — E785 Hyperlipidemia, unspecified: Secondary | ICD-10-CM

## 2020-11-08 DIAGNOSIS — E559 Vitamin D deficiency, unspecified: Secondary | ICD-10-CM

## 2020-11-08 DIAGNOSIS — Z0000 Encounter for general adult medical examination without abnormal findings: Secondary | ICD-10-CM

## 2020-11-08 MED ORDER — HYDROCHLOROTHIAZIDE 25 MG PO TABS: 1.0000 | ORAL_TABLET | ORAL | 3 refills | Status: DC

## 2020-11-08 NOTE — Patient Instructions (Addendum)
If you are interested in the shingles vaccine series (Shingrix), call your insurance or pharmacy to check on coverage and location it must be given.  If affordable - you can schedule it here or at your pharmacy depending on coverage   Continue taking care of yourself   Ice your knee   For cholesterol  Avoid red meat/ fried foods/ egg yolks/ fatty breakfast meats/ butter, cheese and high fat dairy/ and shellfish

## 2020-11-08 NOTE — Assessment & Plan Note (Signed)
Discussed how this problem influences overall health and the risks it imposes  Reviewed plan for weight loss with lower calorie diet (via better food choices and also portion control or program like weight watchers) and exercise building up to or more than 30 minutes 5 days per week including some aerobic activity    

## 2020-11-08 NOTE — Assessment & Plan Note (Signed)
bp in fair control at this time  BP Readings from Last 1 Encounters:  11/08/20 134/82   No changes needed Most recent labs reviewed  Disc lifstyle change with low sodium diet and exercise  Plan to continue hctz 25 mg every other day

## 2020-11-08 NOTE — Assessment & Plan Note (Signed)
Low at 17.5  Was low at endocrinology visit and high dose weekly tx was started in late may  Will continue f/u for that Disc imp to bone and overall health

## 2020-11-08 NOTE — Assessment & Plan Note (Addendum)
Lab Results  Component Value Date   HGBA1C 6.7 (A) 10/11/2020   Improved  Under card of endocrinology On a statin  Better diet /adding exercise  Commended Eye exam and microalb are up to date

## 2020-11-08 NOTE — Assessment & Plan Note (Signed)
Reviewed health habits including diet and exercise and skin cancer prevention Reviewed appropriate screening tests for age  Also reviewed health mt list, fam hx and immunization status , as well as social and family history   See HPI Labs reviewed  Disc shingrix vaccine, may be interested if covered  covid immunized with booster  Colonoscopy and mammogram utd Pap utd

## 2020-11-08 NOTE — Assessment & Plan Note (Signed)
Disc goals for lipids and reasons to control them Rev last labs with pt Rev low sat fat diet in detail Some improvement in LDL with intermittent crestor dosing but not at goal  Managed by endocrinology  May be candidate for pcyk9 in the future  Commended on improved diet so far

## 2020-11-08 NOTE — Progress Notes (Signed)
Subjective:    Patient ID: Krista Newton, female    DOB: [birth year], 55 y.o.   MRN: [id number]  This visit occurred during the SARS-CoV-2 public health emergency.  Safety protocols were in place, including screening questions prior to the visit, additional usage of staff PPE, and extensive cleaning of exam room while observing appropriate contact time as indicated for disinfecting solutions.   HPI Here for health maintenance exam and to review chronic medical problems    Wt Readings from Last 3 Encounters:  11/08/20 230 lb 3 oz (104.4 kg)  10/11/20 232 lb 6 oz (105.4 kg)  06/14/20 242 lb 8 oz (110 kg)   39.20 kg/m   Her BIL passed recently  9 y wedding Krista Newton is upcoming   Feeling better overall  Changed the way she eats  Walks as tolerated   Zoster status -may be interested in shingrix Covid immunized - had the booster in April  Had covid 19 in January  (feels like her tonsils swell oc after that)  Tdap 9/17   Pap 1/19 -neg with neg HPV Goes to gyn and due for f/u (Dr Phineas Real retired)   Colonoscopy 3/21 wit h7 y recall   Mammogram 1/22 Self breast exam - no lumps   HTN bp is stable today  No cp or palpitations or headaches or edema  No side effects to medicines  BP Readings from Last 3 Encounters:  11/08/20 134/82  10/11/20 136/86  06/14/20 (!) 150/90     Pulse Readings from Last 3 Encounters:  11/08/20 82  10/11/20 84  06/14/20 90    DM2 Sees endocrinology  Improved and pleased with that  6.7 last a1c Rev notes    Hyperlipidemia Lab Results  Component Value Date   CHOL 233 (H) 11/01/2020   CHOL 271 (H) 10/11/2020   CHOL 247 (H) 05/15/2019   Lab Results  Component Value Date   HDL 52.20 11/01/2020   HDL 58.40 10/11/2020   HDL 47.10 05/15/2019   Lab Results  Component Value Date   LDLCALC 154 (H) 11/01/2020   LDLCALC 182 (H) 10/11/2020   LDLCALC 182 (H) 05/15/2019   Lab Results  Component Value Date   TRIG 137.0 11/01/2020   TRIG  156.0 (H) 10/11/2020   TRIG 89.0 05/15/2019   Lab Results  Component Value Date   CHOLHDL 4 11/01/2020   CHOLHDL 5 10/11/2020   CHOLHDL 5 05/15/2019   No results found for: LDLDIRECT Impressive dec in that  Taking crestor 5 mg every other day - due to muscle aches and pain  Discussed pcyk9-may be an option    H/o low D Vit D level is 17.5 Px weekly tx now from endocrinology -in may   Lab Results  Component Value Date   WBC 6.5 11/01/2020   HGB 13.7 11/01/2020   HCT 40.1 11/01/2020   MCV 89.6 11/01/2020   PLT 254.0 11/01/2020   Lab Results  Component Value Date   CREATININE 0.69 11/01/2020   BUN 18 11/01/2020   NA 139 11/01/2020   K 4.2 11/01/2020   CL 107 11/01/2020   CO2 24 11/01/2020   Lab Results  Component Value Date   ALT 13 11/01/2020   AST 12 11/01/2020   ALKPHOS 69 11/01/2020   BILITOT 0.5 11/01/2020   Lab Results  Component Value Date   TSH 2.53 11/01/2020    Patient Active Problem List   Diagnosis Date Noted   Dyslipidemia 12/08/2019   Need for  hepatitis C screening test 05/15/2019   Encounter for screening for HIV 05/15/2019   Screening-pulmonary TB 05/15/2019   Colon cancer screening 05/15/2019   Vitamin D deficiency 11/22/2018   Numbness and tingling sensation of skin 10/04/2018   Neck pain 08/18/2017   Productive cough 08/18/2017   Exposure to communicable disease 01/19/2016   Morbid obesity ([place]) 07/07/2015   Essential hypertension 07/05/2015   Hyperlipidemia associated with type 2 diabetes mellitus ([place]) 08/31/2014   Type 2 diabetes mellitus with hyperglycemia, without long-term current use of insulin ([place]) 02/15/2014   Leg cramps 02/06/2014   History of shingles 09/18/2013   Carpal tunnel syndrome 05/31/2013   Routine general medical examination at a health care facility 07/04/2012   HEARING LOSS, RIGHT EAR 07/26/2008   ALLERGIC RHINITIS 06/28/2008   Past Medical History:  Diagnosis Date   Allergy    Anemia    with pregnancy     COVID-19 virus infection 04/17/2019   Diabetes mellitus without complication ([place])    Hyperlipidemia    Hypertension    Past Surgical History:  Procedure Laterality Date   CARPAL TUNNEL RELEASE Bilateral 04/24/2014   CESAREAN SECTION     TUBAL LIGATION     Social History   Tobacco Use   Smoking status: Never   Smokeless tobacco: Never  Vaping Use   Vaping Use: Never used  Substance Use Topics   Alcohol use: Yes    Alcohol/week: 0.0 standard drinks    Comment: Rare   Drug use: No   Family History  Problem Relation Age of Onset   Hypertension Mother    Diabetes Mother    Heart failure Mother    Hypertension Father    Diabetes Sister    Kidney failure Brother    Colon cancer Neg Hx    Colon polyps Neg Hx    Esophageal cancer Neg Hx    Rectal cancer Neg Hx    Stomach cancer Neg Hx    Allergies  Allergen Reactions   Metformin And Related Other (See Comments)    The XR caused sore throat, HA, GI issues   Current Outpatient Medications on File Prior to Visit  Medication Sig Dispense Refill   Blood Glucose Monitoring Suppl (ONETOUCH VERIO REFLECT) w/Device KIT Use to check blood sugar 2 times a day 1 kit 0   empagliflozin (JARDIANCE) 10 MG TABS tablet Take 1 tablet (10 mg total) by mouth daily before breakfast. 30 tablet 6   glipiZIDE (GLUCOTROL) 10 MG tablet Take 1 tablet (10 mg total) by mouth 2 (two) times daily before a meal. 180 tablet 3   Lancets (ONETOUCH ULTRASOFT) lancets Twice daily 100 each 12   naproxen sodium (ALEVE) 220 MG tablet Take 220 mg by mouth 2 (two) times daily as needed.     ONETOUCH VERIO test strip Use as instructed to check blood sugar once daily 100 each 12   rosuvastatin (CRESTOR) 5 MG tablet Take 1 tablet (5 mg total) by mouth as directed. Four  times a week 52 tablet 3   Vitamin D, Ergocalciferol, (DRISDOL) 1.25 MG (50000 UNIT) CAPS capsule Take 1 capsule (50,000 Units total) by mouth every 7 (seven) days. 13 capsule 0   cholecalciferol  (VITAMIN D3) 25 MCG (1000 UNIT) tablet Take 1,000 Units by mouth daily. (Patient not taking: Reported on 11/08/2020)     No current facility-administered medications on file prior to visit.     Review of Systems  Constitutional:  Negative for activity change, appetite  change, fatigue, fever and unexpected weight change.  HENT:  Negative for congestion, ear pain, rhinorrhea, sinus pressure and sore throat.   Eyes:  Negative for pain, redness and visual disturbance.  Respiratory:  Negative for cough, shortness of breath and wheezing.   Cardiovascular:  Negative for chest pain and palpitations.  Gastrointestinal:  Negative for abdominal pain, blood in stool, constipation and diarrhea.  Endocrine: Negative for polydipsia and polyuria.  Genitourinary:  Negative for dysuria, frequency and urgency.  Musculoskeletal:  Negative for arthralgias, back pain and myalgias.       Right knee pain   Skin:  Negative for pallor and rash.  Allergic/Immunologic: Negative for environmental allergies.  Neurological:  Negative for dizziness, syncope and headaches.  Hematological:  Negative for adenopathy. Does not bruise/bleed easily.  Psychiatric/Behavioral:  Negative for decreased concentration and dysphoric mood. The patient is not nervous/anxious.       Objective:   Physical Exam Constitutional:      General: She is not in acute distress.    Appearance: Normal appearance. She is well-developed. She is obese. She is not ill-appearing or diaphoretic.  HENT:     Head: Normocephalic and atraumatic.     Right Ear: Tympanic membrane, ear canal and external ear normal.     Left Ear: Tympanic membrane, ear canal and external ear normal.     Nose: Nose normal. No congestion.     Mouth/Throat:     Mouth: Mucous membranes are moist.     Pharynx: Oropharynx is clear. No posterior oropharyngeal erythema.  Eyes:     General: No scleral icterus.    Extraocular Movements: Extraocular movements intact.      Conjunctiva/sclera: Conjunctivae normal.     Pupils: Pupils are equal, round, and reactive to light.  Neck:     Thyroid: No thyromegaly.     Vascular: No carotid bruit or JVD.  Cardiovascular:     Rate and Rhythm: Normal rate and regular rhythm.     Pulses: Normal pulses.     Heart sounds: Normal heart sounds.    No gallop.  Pulmonary:     Effort: Pulmonary effort is normal. No respiratory distress.     Breath sounds: Normal breath sounds. No wheezing.     Comments: Good air exch Chest:     Chest wall: No tenderness.  Abdominal:     General: Bowel sounds are normal. There is no distension or abdominal bruit.     Palpations: Abdomen is soft. There is no mass.     Tenderness: There is no abdominal tenderness.     Hernia: No hernia is present.  Genitourinary:    Comments: Breast exam: No mass, nodules, thickening, tenderness, bulging, retraction, inflamation, nipple discharge or skin changes noted.  No axillary or clavicular LA.     Musculoskeletal:        General: No tenderness. Normal range of motion.     Cervical back: Normal range of motion and neck supple. No rigidity. No muscular tenderness.     Right lower leg: No edema.     Left lower leg: No edema.     Comments: No kyphosis   Lymphadenopathy:     Cervical: No cervical adenopathy.  Skin:    General: Skin is warm and dry.     Coloration: Skin is not pale.     Findings: No erythema or rash.  Neurological:     Mental Status: She is alert. Mental status is at baseline.     Cranial  Nerves: No cranial nerve deficit.     Motor: No abnormal muscle tone.     Coordination: Coordination normal.     Gait: Gait normal.     Deep Tendon Reflexes: Reflexes are normal and symmetric. Reflexes normal.  Psychiatric:        Mood and Affect: Mood normal.        Cognition and Memory: Cognition and memory normal.          Assessment & Plan:   Problem List Items Addressed This Visit       Cardiovascular and Mediastinum    Essential hypertension    bp in fair control at this time  BP Readings from Last 1 Encounters:  11/08/20 134/82  No changes needed Most recent labs reviewed  Disc lifstyle change with low sodium diet and exercise  Plan to continue hctz 25 mg every other day       Relevant Medications   hydrochlorothiazide (HYDRODIURIL) 25 MG tablet     Endocrine   Type 2 diabetes mellitus with hyperglycemia, without long-term current use of insulin ([facility])    Lab Results  Component Value Date   HGBA1C 6.7 (A) 10/11/2020  Improved  Under card of endocrinology On a statin  Better diet /adding exercise  Commended Eye exam and microalb are up to date       Hyperlipidemia associated with type 2 diabetes mellitus ([place])    Disc goals for lipids and reasons to control them Rev last labs with pt Rev low sat fat diet in detail Some improvement in LDL with intermittent crestor dosing but not at goal  Managed by endocrinology  May be candidate for pcyk9 in the future  Commended on improved diet so far         Other   Routine general medical examination at a health care facility - Primary    Reviewed health habits including diet and exercise and skin cancer prevention Reviewed appropriate screening tests for age  Also reviewed health mt list, fam hx and immunization status , as well as social and family history   See HPI Labs reviewed  Disc shingrix vaccine, may be interested if covered  covid immunized with booster  Colonoscopy and mammogram utd Pap utd         Morbid obesity ([place])    Discussed how this problem influences overall health and the risks it imposes  Reviewed plan for weight loss with lower calorie diet (via better food choices and also portion control or program like weight watchers) and exercise building up to or more than 30 minutes 5 days per week including some aerobic activity          Vitamin D deficiency    Low at 17.5  Was low at endocrinology visit and high  dose weekly tx was started in late may  Will continue f/u for that Disc imp to bone and overall health

## 2020-11-22 ENCOUNTER — Other Ambulatory Visit: Payer: Self-pay

## 2020-11-22 ENCOUNTER — Encounter: Payer: Self-pay | Admitting: Family Medicine

## 2020-11-22 ENCOUNTER — Ambulatory Visit (INDEPENDENT_AMBULATORY_CARE_PROVIDER_SITE_OTHER)
Admission: RE | Admit: 2020-11-22 | Discharge: 2020-11-22 | Disposition: A | Discharge status: Home / Self Care | Payer: 59 | Source: Ambulatory Visit | Attending: Family Medicine | Admitting: Family Medicine

## 2020-11-22 ENCOUNTER — Ambulatory Visit: Payer: 59 | Admitting: Family Medicine

## 2020-11-22 DIAGNOSIS — M25562 Pain in left knee: Secondary | ICD-10-CM

## 2020-11-22 NOTE — Patient Instructions (Signed)
Get a knee sleeve  (elastic or neoprene)  Wear it when you are active  The compression from this helps  Also stabilizes  Knee xray today   Keep icing  Voltaren gel over the counter - apply it to knee up to 4 times daily  Tylenol is fine

## 2020-11-22 NOTE — Progress Notes (Signed)
Subjective:    Patient ID: Krista Newton, female    DOB: 1965-07-20, 55 y.o.   MRN: [id number]  This visit occurred during the SARS-CoV-2 public health emergency.  Safety protocols were in place, including screening questions prior to the visit, additional usage of staff PPE, and extensive cleaning of exam room while observing appropriate contact time as indicated for disinfecting solutions.   HPI Pt presents for problems with L knee  Wt Readings from Last 3 Encounters:  11/22/20 232 lb 7 oz (105.4 kg)  11/08/20 230 lb 3 oz (104.4 kg)  10/11/20 232 lb 6 oz (105.4 kg)   39.59 kg/m  Swollen around the knee cap Iced it  Following week - more popping and clicking (for instance rolling over in bed)  More swollen yesterday - soaked in tub and that helped  Took tylenol pm  Took aleve one day   Is bothersome at night   No recent or remote trauma   Had some knee pain years ago when she worked on her feet for 12 hour days  She sits more now (tries to elevate on a bucket at work)    Shooting pain with some change in position Hurts to start walking and then also when walking    Has not felt unstable -is very careful   Patient Active Problem List   Diagnosis Date Noted   Left knee pain 11/22/2020   Dyslipidemia 12/08/2019   Need for hepatitis C screening test 05/15/2019   Encounter for screening for HIV 05/15/2019   Screening-pulmonary TB 05/15/2019   Colon cancer screening 05/15/2019   Vitamin D deficiency 11/22/2018   Numbness and tingling sensation of skin 10/04/2018   Neck pain 08/18/2017   Productive cough 08/18/2017   Exposure to communicable disease 01/19/2016   Morbid obesity ([place]) 07/07/2015   Essential hypertension 07/05/2015   Hyperlipidemia associated with type 2 diabetes mellitus ([place]) 08/31/2014   Type 2 diabetes mellitus with hyperglycemia, without long-term current use of insulin ([place]) 02/15/2014   Leg cramps 02/06/2014   History of shingles 09/18/2013    Carpal tunnel syndrome 05/31/2013   Routine general medical examination at a health care facility 07/04/2012   HEARING LOSS, RIGHT EAR 07/26/2008   ALLERGIC RHINITIS 06/28/2008   Past Medical History:  Diagnosis Date   Allergy    Anemia    with pregnancy    COVID-19 virus infection 04/17/2019   Diabetes mellitus without complication ([place])    Hyperlipidemia    Hypertension    Past Surgical History:  Procedure Laterality Date   CARPAL TUNNEL RELEASE Bilateral 04/24/2014   CESAREAN SECTION     TUBAL LIGATION     Social History   Tobacco Use   Smoking status: Never   Smokeless tobacco: Never  Vaping Use   Vaping Use: Never used  Substance Use Topics   Alcohol use: Yes    Alcohol/week: 0.0 standard drinks    Comment: Rare   Drug use: No   Family History  Problem Relation Age of Onset   Hypertension Mother    Diabetes Mother    Heart failure Mother    Hypertension Father    Diabetes Sister    Kidney failure Brother    Colon cancer Neg Hx    Colon polyps Neg Hx    Esophageal cancer Neg Hx    Rectal cancer Neg Hx    Stomach cancer Neg Hx    Allergies  Allergen Reactions   Metformin And Related Other (  See Comments)    The XR caused sore throat, HA, GI issues   Current Outpatient Medications on File Prior to Visit  Medication Sig Dispense Refill   Blood Glucose Monitoring Suppl (ONETOUCH VERIO REFLECT) w/Device KIT Use to check blood sugar 2 times a day 1 kit 0   cholecalciferol (VITAMIN D3) 25 MCG (1000 UNIT) tablet Take 1,000 Units by mouth daily.     empagliflozin (JARDIANCE) 10 MG TABS tablet Take 1 tablet (10 mg total) by mouth daily before breakfast. 30 tablet 6   glipiZIDE (GLUCOTROL) 10 MG tablet Take 1 tablet (10 mg total) by mouth 2 (two) times daily before a meal. 180 tablet 3   hydrochlorothiazide (HYDRODIURIL) 25 MG tablet Take 1 tablet (25 mg total) by mouth every other day. 45 tablet 3   Lancets (ONETOUCH ULTRASOFT) lancets Twice daily 100 each 12    naproxen sodium (ALEVE) 220 MG tablet Take 220 mg by mouth 2 (two) times daily as needed.     ONETOUCH VERIO test strip Use as instructed to check blood sugar once daily 100 each 12   rosuvastatin (CRESTOR) 5 MG tablet Take 1 tablet (5 mg total) by mouth as directed. Four  times a week 52 tablet 3   Vitamin D, Ergocalciferol, (DRISDOL) 1.25 MG (50000 UNIT) CAPS capsule Take 1 capsule (50,000 Units total) by mouth every 7 (seven) days. 13 capsule 0   No current facility-administered medications on file prior to visit.    Review of Systems  Constitutional:  Negative for activity change, appetite change, fatigue, fever and unexpected weight change.  HENT:  Negative for congestion, ear pain, rhinorrhea, sinus pressure and sore throat.   Eyes:  Negative for pain, redness and visual disturbance.  Respiratory:  Negative for cough, shortness of breath and wheezing.   Cardiovascular:  Negative for chest pain and palpitations.  Gastrointestinal:  Negative for abdominal pain, blood in stool, constipation and diarrhea.  Endocrine: Negative for polydipsia and polyuria.  Genitourinary:  Negative for dysuria, frequency and urgency.  Musculoskeletal:  Negative for arthralgias, back pain and myalgias.       L knee pain and swelling  Skin:  Negative for pallor and rash.  Allergic/Immunologic: Negative for environmental allergies.  Neurological:  Negative for dizziness, syncope and headaches.  Hematological:  Negative for adenopathy. Does not bruise/bleed easily.  Psychiatric/Behavioral:  Negative for decreased concentration and dysphoric mood. The patient is not nervous/anxious.       Objective:   Physical Exam Constitutional:      General: She is not in acute distress.    Appearance: Normal appearance. She is obese. She is not ill-appearing.  Cardiovascular:     Rate and Rhythm: Normal rate and regular rhythm.     Heart sounds: Normal heart sounds.  Pulmonary:     Effort: Pulmonary effort is  normal.     Breath sounds: Normal breath sounds.  Musculoskeletal:     Right knee: Normal.     Left knee: Swelling, effusion and crepitus present. No erythema or ecchymosis. Decreased range of motion. Tenderness present over the medial joint line and lateral joint line. Normal patellar mobility. Normal pulse.     Instability Tests: Anterior drawer test negative. Anterior Lachman test negative.     Comments: L knee Pain to flex past 90 degrees  Not unstable  Pain with mc murray  Skin:    Findings: No bruising, erythema or rash.  Neurological:     Mental Status: She is alert.  Sensory: No sensory deficit.     Motor: No weakness.  Psychiatric:        Mood and Affect: Mood normal.          Assessment & Plan:   Problem List Items Addressed This Visit       Other   Left knee pain    Acute with swelling (happened once before years ago)  W/o trauma  Exam consistent with mild effusion (no erythema or warmth)  Xray ordered  Adv use of elastic or neoprene knee sleeve for compression  Ice and elevation  voltaren gel otc up to 4 times daily  Pend rad rev -consider sport med or PT or ortho       Relevant Orders   DG Knee 4 Views W/Patella Left

## 2020-11-23 NOTE — Assessment & Plan Note (Signed)
Acute with swelling (happened once before years ago)  W/o trauma  Exam consistent with mild effusion (no erythema or warmth)  Xray ordered  Adv use of elastic or neoprene knee sleeve for compression  Ice and elevation  voltaren gel otc up to 4 times daily  Pend rad rev -consider sport med or PT or ortho

## 2020-11-25 ENCOUNTER — Encounter: Payer: Self-pay | Admitting: Family Medicine

## 2020-12-02 ENCOUNTER — Other Ambulatory Visit: Payer: Self-pay

## 2020-12-02 ENCOUNTER — Encounter: Payer: Self-pay | Admitting: Family Medicine

## 2020-12-02 ENCOUNTER — Ambulatory Visit (INDEPENDENT_AMBULATORY_CARE_PROVIDER_SITE_OTHER): Payer: 59 | Admitting: Family Medicine

## 2020-12-02 VITALS — BP 164/88 | HR 110 | Temp 97.7°F | Ht 64.25 in | Wt 232.0 lb

## 2020-12-02 DIAGNOSIS — M2392 Unspecified internal derangement of left knee: Secondary | ICD-10-CM

## 2020-12-02 DIAGNOSIS — M25462 Effusion, left knee: Secondary | ICD-10-CM | POA: Diagnosis not present

## 2020-12-02 MED ORDER — DICLOFENAC SODIUM 75 MG PO TBEC: 75.0000 mg | DELAYED_RELEASE_TABLET | Freq: Two times a day (BID) | ORAL | 1 refills | Status: DC

## 2020-12-02 NOTE — Progress Notes (Signed)
[name], MD, [place] at [facility] [place], [address]  Phone: [phone]  FAX: [place] - 55 y.o. female  MRN [id number]  Date of Birth: Oct 13, 1965  Date: 12/02/2020  PCP: Abner Greenspan, MD  Referral: Abner Greenspan, MD  Chief Complaint  Patient presents with   Knee Pain    This visit occurred during the SARS-CoV-2 public health emergency.  Safety protocols were in place, including screening questions prior to the visit, additional usage of staff PPE, and extensive cleaning of exam room while observing appropriate contact time as indicated for disinfecting solutions.   Subjective:   Krista Newton is a 55 y.o. very pleasant female patient with Body mass index is 39.51 kg/m. who presents with the following:  11/22/2020 she saw her PCP [name], and now she is here to follow-up with me.  While she had no injury, her knee started to swell at the end of June.  It got to be quite swollen and she had what she describes as a large effusion with some lower extremity edema as well.  At this point, the lower extremity swelling is decreased, and she does note that her effusion has decreased quite a bit in size.  Nevertheless, it is obvious even with visualization.  She does have some pain with turning and twisting the knee, but at this point it is not severe.  She has done some icing.  She also has no significant prior knee history.  No fractures, dislocations or surgery in the past.  She has been doing some compression as well and some intermittent NSAIDs.  The radiological images were independently reviewed by myself in the office and results were reviewed with the patient. My independent interpretation of images: Radiology describes this is no significant degenerative change.  On my independent review and discussion with the patient, I do think that she has some very early lateral  compartmental osteoarthritis visualized best on the AP weightbearing and in the [place] view.  Electronically Signed  By: Owens Loffler, MD On: 12/02/2020  3:20 PM EDT   Review of Systems is noted in the HPI, as appropriate   Objective:   BP (!) 164/88   Pulse (!) 110   Temp 97.7 F (36.5 C) (Temporal)   Ht 5' 4.25" (1.632 m)   Wt 232 lb (105.2 kg)   SpO2 98%   BMI 39.51 kg/m    Knee: Left Gait: Normal heel toe pattern ROM: 0- 105 degrees Effusion: Moderate to large ballotable effusion Echymosis or edema: none Patellar tendon NT Painful PLICA: neg Patellar grind: negative Medial and lateral patellar facet loading: negative medial and lateral joint lines:NT Mcmurray's positive for pain Flexion-pinch positive for pain Varus and valgus stress: stable Lachman: neg Ant and Post drawer: neg Hip abduction, IR, ER: WNL Hip flexion str: 5/5 Hip abd: 5/5 Quad: 5/5 VMO atrophy:No Hamstring concentric and eccentric: 5/5   Radiology: DG Knee 4 Views W/Patella Left  Result Date: 11/23/2020 CLINICAL DATA:  Left knee pain and swelling EXAM: LEFT KNEE - COMPLETE 4+ VIEW COMPARISON:  None. FINDINGS: Frontal, lateral, oblique, and sunrise views of the left knee are obtained. No fracture, subluxation, or dislocation. Joint spaces are well preserved. No joint effusion. Limited imaging of the right knee is unremarkable. IMPRESSION: 1. Unremarkable left knee. Electronically Signed   By: Randa Ngo M.D.   On: 11/23/2020 16:44    See  my interpretation above.  Assessment and Plan:     ICD-10-CM   1. Internal derangement of left knee  M23.92     2. Effusion of left knee joint  M25.462      While she is not having any focal pain, she does have a large effusion right now.  It is not surprising that any form of forced flexion causes her pain.  Historically, she does say that this is been improving quite a bit over the last couple of weeks.  She is using a mild compressive  sleeve.  Right now, I am going to start her on high-dose anti-inflammatories, continue compression during the day, and ice after work.  If her symptoms persist after the next month, aspiration of her knee would be quite easy and would be a reasonable next step.  Meds ordered this encounter  Medications   diclofenac (VOLTAREN) 75 MG EC tablet    Sig: Take 1 tablet (75 mg total) by mouth 2 (two) times daily.    Dispense:  60 tablet    Refill:  1    Signed,  [name], MD   Outpatient Encounter Medications as of 12/02/2020  Medication Sig   Blood Glucose Monitoring Suppl (ONETOUCH VERIO REFLECT) w/Device KIT Use to check blood sugar 2 times a day   cholecalciferol (VITAMIN D3) 25 MCG (1000 UNIT) tablet Take 1,000 Units by mouth daily.   diclofenac (VOLTAREN) 75 MG EC tablet Take 1 tablet (75 mg total) by mouth 2 (two) times daily.   empagliflozin (JARDIANCE) 10 MG TABS tablet Take 1 tablet (10 mg total) by mouth daily before breakfast.   glipiZIDE (GLUCOTROL) 10 MG tablet Take 1 tablet (10 mg total) by mouth 2 (two) times daily before a meal.   hydrochlorothiazide (HYDRODIURIL) 25 MG tablet Take 1 tablet (25 mg total) by mouth every other day.   Lancets (ONETOUCH ULTRASOFT) lancets Twice daily   naproxen sodium (ALEVE) 220 MG tablet Take 220 mg by mouth 2 (two) times daily as needed.   ONETOUCH VERIO test strip Use as instructed to check blood sugar once daily   rosuvastatin (CRESTOR) 5 MG tablet Take 1 tablet (5 mg total) by mouth as directed. Four  times a week   Vitamin D, Ergocalciferol, (DRISDOL) 1.25 MG (50000 UNIT) CAPS capsule Take 1 capsule (50,000 Units total) by mouth every 7 (seven) days.   No facility-administered encounter medications on file as of 12/02/2020.

## 2021-01-09 ENCOUNTER — Encounter: Payer: Self-pay | Admitting: Family Medicine

## 2021-01-13 ENCOUNTER — Other Ambulatory Visit: Payer: Self-pay | Admitting: Internal Medicine

## 2021-01-13 NOTE — Telephone Encounter (Signed)
  Notes to clinic:  Last not state for patient to take for 3 months and follow up on continued use  Appointment not until 04/18/2021   Requested Prescriptions  Pending Prescriptions Disp Refills   Vitamin D, Ergocalciferol, (DRISDOL) 1.25 MG (50000 UNIT) CAPS capsule [Pharmacy Med Name: VITAMIN D2 1.'25MG'$ (50,000 UNIT)] 4 capsule 3    Sig: Take 1 capsule (50,000 Units total) by mouth every 7 (seven) days.     There is no refill protocol information for this order

## 2021-01-22 ENCOUNTER — Telehealth: Payer: 59 | Admitting: Medical

## 2021-04-18 ENCOUNTER — Ambulatory Visit: Payer: 59 | Admitting: Internal Medicine

## 2021-04-18 ENCOUNTER — Other Ambulatory Visit: Payer: Self-pay

## 2021-04-18 ENCOUNTER — Encounter: Payer: Self-pay | Admitting: Internal Medicine

## 2021-04-18 VITALS — BP 136/80 | HR 85 | Ht 64.25 in | Wt 232.0 lb

## 2021-04-18 DIAGNOSIS — E559 Vitamin D deficiency, unspecified: Secondary | ICD-10-CM | POA: Diagnosis not present

## 2021-04-18 DIAGNOSIS — E785 Hyperlipidemia, unspecified: Secondary | ICD-10-CM | POA: Diagnosis not present

## 2021-04-18 DIAGNOSIS — E119 Type 2 diabetes mellitus without complications: Secondary | ICD-10-CM | POA: Diagnosis not present

## 2021-04-18 LAB — POCT GLYCOSYLATED HEMOGLOBIN (HGB A1C): Hemoglobin A1C: 6.8 % — AB (ref 4.0–5.6)

## 2021-04-18 LAB — GLUCOSE, POCT (MANUAL RESULT ENTRY): POC Glucose: 144 mg/dl — AB (ref 70–99)

## 2021-04-18 MED ORDER — GLIPIZIDE 10 MG PO TABS: ORAL_TABLET | ORAL | 3 refills | Status: DC

## 2021-04-18 MED ORDER — VITAMIN D (ERGOCALCIFEROL) 1.25 MG (50000 UNIT) PO CAPS: 50000.0000 [IU] | ORAL_CAPSULE | ORAL | 3 refills | Status: DC

## 2021-04-18 NOTE — Patient Instructions (Addendum)
-   Change Glipizide 10 mg, 1 tablet before Breakfast and half a tablet before Supper - Continue Jardiance 10 mg, 1 tablet before Breakfast  - Continue  Crestor 5 mg Three times a week  - Increase Ergocalciferol to twice weekly    HOW TO TREAT LOW BLOOD SUGARS (Blood sugar LESS THAN 70 MG/DL) Please follow the RULE OF 15 for the treatment of hypoglycemia treatment (when your (blood sugars are less than 70 mg/dL)   STEP 1: Take 15 grams of carbohydrates when your blood sugar is low, which includes:  3-4 GLUCOSE TABS  OR 3-4 OZ OF JUICE OR REGULAR SODA OR ONE TUBE OF GLUCOSE GEL    STEP 2: RECHECK blood sugar in 15 MINUTES STEP 3: If your blood sugar is still low at the 15 minute recheck --> then, go back to STEP 1 and treat AGAIN with another 15 grams of carbohydrates.

## 2021-04-18 NOTE — Progress Notes (Signed)
Name: Krista Newton  Age/ Sex: 55 y.o., female   MRN/ DOB: [id number], August 18, 1965     PCP: Abner Greenspan, MD   Reason for Endocrinology Evaluation: Type 2 Diabetes Mellitus     Initial Endocrinology Clinic Visit: 08/01/2018      PATIENT IDENTIFIER: Krista Newton is a 55 y.o. female with a past medical history of HTN and T2DM. The patient has followed with Endocrinology clinic since 08/01/2018 for consultative assistance with management of her diabetes.  DIABETIC HISTORY:  Krista Newton was diagnosed with T2DM in 2015. Krista Newton reports intolerance to Metformin (Throat chemical burn ) . Her hemoglobin A1c has ranged from 7.3% in 2017, peaking at 10.0% in 2020.  On her initial visit to our clinic her A1c was 10.0 % and was on Glipizide.   Intolerant to [place] 08/2018   Works a 2nd shift 3 pm to 11:30   SUBJECTIVE:   During the last visit (10/11/2020): A1c 6.7 % .Continued  Glipizide and continued jardiance   Today (04/18/2021): Krista Newton is here for a   follow up on diabetes management. Krista Newton checks her blood sugars 2 times daily, preprandial to breakfast and supper. The patient has noted  hypoglycemic episodes since the last clinic visit, in the evenings after supper    Weight stable  Denies nausea, vomiting or diarrhea   HOME ENDOCRINE REGIMEN:  Glipizide 10 mg BID  Jardiance 10 mg, 1 tablet before Breakfast  Rosuvastatin 5 mg four times a week - 3 x a day  Ergocalciferol 50000 iu weekly      GLUCOSE LOG :  Unable to evaluate as her phone has died  DIABETIC COMPLICATIONS: Microvascular complications:    Denies: CKD, neuropathy, retinopathy Last eye exam: Completed 12/2019     Macrovascular complications:    Denies: CAD, PVD, CVA   HISTORY:  Past Medical History:  Past Medical History:  Diagnosis Date   Allergy    Anemia    with pregnancy    COVID-19 virus infection 04/17/2019   Diabetes mellitus without complication ([place])    Hyperlipidemia     Hypertension    Past Surgical History:  Past Surgical History:  Procedure Laterality Date   CARPAL TUNNEL RELEASE Bilateral 04/24/2014   CESAREAN SECTION     TUBAL LIGATION     Social History:  reports that Krista Newton has never smoked. Krista Newton has never used smokeless tobacco. Krista Newton reports current alcohol use. Krista Newton reports that Krista Newton does not use drugs. Family History:  Family History  Problem Relation Age of Onset   Hypertension Mother    Diabetes Mother    Heart failure Mother    Hypertension Father    Diabetes Sister    Kidney failure Brother    Colon cancer Neg Hx    Colon polyps Neg Hx    Esophageal cancer Neg Hx    Rectal cancer Neg Hx    Stomach cancer Neg Hx      HOME MEDICATIONS: Allergies as of 04/18/2021       Reactions   Metformin And Related Other (See Comments)   The XR caused sore throat, HA, GI issues        Medication List        Accurate as of April 18, 2021  4:17 PM. If you have any questions, ask your nurse or doctor.          cholecalciferol 25 MCG (1000 UNIT) tablet Commonly known as: VITAMIN D3 Take 1,000  Units by mouth daily.   diclofenac 75 MG EC tablet Commonly known as: VOLTAREN Take 1 tablet (75 mg total) by mouth 2 (two) times daily.   empagliflozin 10 MG Tabs tablet Commonly known as: Jardiance Take 1 tablet (10 mg total) by mouth daily before breakfast.   glipiZIDE 10 MG tablet Commonly known as: GLUCOTROL Take 1 tablet (10 mg total) by mouth daily before breakfast AND 0.5 tablets (5 mg total) daily before supper. What changed: See the new instructions. Changed by: Dorita Sciara, MD   hydrochlorothiazide 25 MG tablet Commonly known as: HYDRODIURIL Take 1 tablet (25 mg total) by mouth every other day.   naproxen sodium 220 MG tablet Commonly known as: ALEVE Take 220 mg by mouth 2 (two) times daily as needed.   onetouch ultrasoft lancets Twice daily   OneTouch Verio Reflect w/Device Kit Use to check blood sugar 2 times  a day   OneTouch Verio test strip Generic drug: glucose blood Use as instructed to check blood sugar once daily   rosuvastatin 5 MG tablet Commonly known as: Crestor Take 1 tablet (5 mg total) by mouth as directed. Four  times a week   Vitamin D (Ergocalciferol) 1.25 MG (50000 UNIT) Caps capsule Commonly known as: DRISDOL Take 1 capsule (50,000 Units total) by mouth 2 (two) times a week. Start taking on: April 21, 2021 What changed: when to take this Changed by: Dorita Sciara, MD        VITAL SIGNS: BP 136/80 (BP Location: Left Arm, Patient Position: Sitting, Cuff Size: Small)   Pulse 85   Ht 5' 4.25" (1.632 m)   Wt 232 lb (105.2 kg)   SpO2 99%   BMI 39.51 kg/m     PHYSICAL EXAM:  General: Pt appears well and is in NAD  Neck: Thyroid: Thyroid size normal.  No goiter or nodules appreciated. No thyroid bruit.  Lungs: Clear with good BS bilat with no rales, rhonchi, or wheezes  Heart: RRR with normal S1 and S2 and no gallops; no murmurs; no rub  Abdomen: Normoactive bowel sounds, soft, nontender, without masses or organomegaly palpable  Extremities:   Lower extremities - No pretibial edema.   Neuro: MS is good with appropriate affect, pt is alert and Ox3      DM foot exam: 10/11/2020  The skin of the feet is intact without sores or ulcerations. The pedal pulses are 2+ on right and 2+ on left. The sensation is intact to a screening 5.07, 10 gram monofilament bilaterally    DATA REVIEWED:  Lab Results  Component Value Date   HGBA1C 6.8 (A) 04/18/2021   HGBA1C 6.7 (A) 10/11/2020   HGBA1C 7.4 (A) 06/14/2020    Latest Reference Range & Units 11/01/20 08:27  Sodium 135 - 145 mEq/L 139  Potassium 3.5 - 5.1 mEq/L 4.2  Chloride 96 - 112 mEq/L 107  CO2 19 - 32 mEq/L 24  Glucose 70 - 99 mg/dL 126 (H)  BUN 6 - 23 mg/dL 18  Creatinine 0.40 - 1.20 mg/dL 0.69  Calcium 8.4 - 10.5 mg/dL 9.3  Alkaline Phosphatase 39 - 117 U/L 69  Albumin 3.5 - 5.2 g/dL 4.1  AST  0 - 37 U/L 12  ALT 0 - 35 U/L 13  Total Protein 6.0 - 8.3 g/dL 6.8  Total Bilirubin 0.2 - 1.2 mg/dL 0.5  GFR >60.00 mL/min 96.79    Latest Reference Range & Units 11/01/20 08:27  Total CHOL/HDL Ratio  4  Cholesterol  0 - 200 mg/dL 233 (H)  HDL Cholesterol >39.00 mg/dL 52.20  LDL (calc) 0 - 99 mg/dL 154 (H)  NonHDL  181.11  Triglycerides 0.0 - 149.0 mg/dL 137.0  VLDL 0.0 - 40.0 mg/dL 27.4  VITD 30.00 - 100.00 ng/mL 17.58 (L)    In ofice BG 144 mg/dL   ASSESSMENT / PLAN / RECOMMENDATIONS:   1) Type 2 Diabetes Mellitus, OPtimally controlled, Without complications - Most recent A1c of 6.8 %. Goal A1c < 7.0 %.    - A1c stable  - Intolerant to Metformin - Developed UTI to jardiance in 2020 , but currently tolerating well the small dose , reluctant to increase the dose at this time -Krista Newton has had 4 episode of hypoglycemic episodes after supper, we discussed reducing glipizide to half a tablet with supper we discussed the importance of limiting carbohydrate intake as well as staying consistent with that.  I explained to the patient that Krista Newton may notice hypoglycemia in the morning with increased CHO intake and reducing glipizide -Krista Newton may increase glipizide to 1 tablet if Krista Newton is going to eat larger than usual meal   MEDICATIONS:  -Change glipizide 10 mg, 1 tablet before Breakfast and half tablet before Supper - Continue Jardiance 10 mg, 1 tablet before Breakfast    EDUCATION / INSTRUCTIONS: BG monitoring instructions: Patient is instructed to check her blood sugars 2 times a day, fasting and supper. Call [place] [facility] if: BG persistently < 70  I reviewed the Rule of 15 for the treatment of hypoglycemia in detail with the patient. Literature supplied.  2) Diabetic complications:  Eye: Does not have known diabetic retinopathy.  Neuro/ Feet: Does not have known diabetic peripheral neuropathy. Renal: Patient does not have known baseline CKD. Krista Newton is not on an ACEI/ARB at  present.     3) Dyslipidemia :  - Intolerant to daily rosuvastatin due to myalgia,Krista Newton has not been compliant with  3x weekly  Of rosuvastatin - Krista Newton has declined  PCSK-9 inhibitors in the past  - Today Krista Newton would like to hold off on Zetia and will work on lifestyle changes     Medications Continue  Rosuvastatin 5 mg , three  times a week   4) Vitamin D deficiency :  - This was as low as 10.58 in  2020 .  Vitamin D continues to be low - OTC vitamin D caused hyperglycemia   -We will increase as below    Increase Ergocalciferol 50000 iu twice weekly     F/U in 6  months    Signed electronically by: Mack Guise, MD  [facility] Endocrinology  [address] [place], [place] [address] Phone: [phone] FAX: [phone]   CC: [place], Wynelle Fanny, MD [place] [address] Phone: [phone]  Fax: [phone]  Return to Endocrinology clinic as below: Future Appointments  Date Time Provider [place]  10/17/2021  3:00 PM Gianny Sabino, Melanie Crazier, MD [id number] None

## 2021-05-22 LAB — HM DIABETES EYE EXAM

## 2021-06-25 ENCOUNTER — Ambulatory Visit (INDEPENDENT_AMBULATORY_CARE_PROVIDER_SITE_OTHER): Payer: 59 | Admitting: Radiology

## 2021-06-25 ENCOUNTER — Encounter: Payer: Self-pay | Admitting: Radiology

## 2021-06-25 ENCOUNTER — Other Ambulatory Visit (HOSPITAL_COMMUNITY)
Admission: RE | Admit: 2021-06-25 | Discharge: 2021-06-25 | Disposition: A | Discharge status: Home / Self Care | Payer: 59 | Source: Ambulatory Visit | Attending: Radiology | Admitting: Radiology

## 2021-06-25 ENCOUNTER — Other Ambulatory Visit: Payer: Self-pay

## 2021-06-25 VITALS — BP 116/80 | Ht 63.75 in | Wt 235.0 lb

## 2021-06-25 DIAGNOSIS — Z01419 Encounter for gynecological examination (general) (routine) without abnormal findings: Secondary | ICD-10-CM

## 2021-06-25 DIAGNOSIS — Z9189 Other specified personal risk factors, not elsewhere classified: Secondary | ICD-10-CM | POA: Diagnosis not present

## 2021-06-25 NOTE — Progress Notes (Signed)
° °  Krista Newton [facility] 01/13/1966 [id number]   History: Postmenopausal 56 y.o. G 2P2 presents for annual exam.  Gynecologic History Sexually active: yes Hormone replacement therapy: no Symptoms controlled: yes  Health Maintenance Last Pap: 2019. Results were: normal Last mammogram: 2022. Results were: normal Last colonoscopy: 2021. Results were: normal Last Dexa: never   Past medical history, past surgical history, family history and social history were all reviewed and documented in the EPIC chart.  ROS:  A ROS was performed and pertinent positives and negatives are included. Review of Systems - Negative except weight gain  Exam:  Vitals:   06/25/21 0943  BP: 116/80  Weight: 235 lb (106.6 kg)  Height: 5' 3.75" (1.619 m)   Body mass index is 40.65 kg/m.  General appearance:  Normal, obese Thyroid:  Symmetrical, normal in size, without palpable masses or nodularity. Respiratory  Auscultation:  Clear without wheezing or rhonchi Cardiovascular  Auscultation:  Regular rate, without rubs, murmurs or gallops  Edema/varicosities:  Not grossly evident Abdominal  Soft,nontender, without masses, guarding or rebound.  Liver/spleen:  No organomegaly noted  Hernia:  None appreciated  Skin  Inspection:  Grossly normal Breasts: Examined lying and sitting.   Right: Without masses, retractions, nipple discharge or axillary adenopathy.   Left: Without masses, retractions, nipple discharge or axillary adenopathy. Genitourinary   Inguinal/mons:  Normal without inguinal adenopathy  External genitalia:  Normal appearing vulva with no masses, tenderness, or lesions  BUS/Urethra/Skene's glands:  Normal  Vagina:  Normal appearing with normal color and discharge, no lesions. Atrophy: none Prolapse: n/a  Cervix:  Normal appearing without discharge or lesions  Uterus:  Normal in size, shape and contour.  Midline and mobile, nontender  Adnexa/parametria:     Rt: Normal in size, without masses  or tenderness.   Lt: Normal in size, without masses or tenderness.  Anus and perineum: Normal  Digital rectal exam: Normal sphincter tone without palpated masses or tenderness  Patient informed chaperone available to be present for breast and pelvic exam. Patient has requested no chaperone to be present. Patient has been advised what will be completed during breast and pelvic exam.   Assessment/Plan:  56 y.o. G 2P2 for annual exam.   Schedule mammo and DEXA Pap with cotesting  Discussed SBE, colonoscopy and DEXA screening as directed. Recommend 139mins of exercise weekly, including weight bearing exercise. Encouraged the use of seatbelts and sunscreen.  Return in 1 year for annual or sooner prn.  Darolyn Double B WHNP-BC, 10:10 AM 06/25/2021

## 2021-06-26 ENCOUNTER — Other Ambulatory Visit: Payer: Self-pay | Admitting: *Deleted

## 2021-06-26 DIAGNOSIS — Z13820 Encounter for screening for osteoporosis: Secondary | ICD-10-CM

## 2021-06-27 LAB — CYTOLOGY - PAP
Adequacy: ABSENT
Comment: NEGATIVE
Diagnosis: NEGATIVE
High risk HPV: NEGATIVE

## 2021-07-11 ENCOUNTER — Other Ambulatory Visit: Payer: Self-pay | Admitting: Internal Medicine

## 2021-09-13 ENCOUNTER — Other Ambulatory Visit: Payer: Self-pay | Admitting: Internal Medicine

## 2021-10-14 ENCOUNTER — Ambulatory Visit (INDEPENDENT_AMBULATORY_CARE_PROVIDER_SITE_OTHER): Payer: 59

## 2021-10-14 ENCOUNTER — Other Ambulatory Visit: Payer: Self-pay | Admitting: Radiology

## 2021-10-14 DIAGNOSIS — Z13820 Encounter for screening for osteoporosis: Secondary | ICD-10-CM

## 2021-10-14 DIAGNOSIS — Z780 Asymptomatic menopausal state: Secondary | ICD-10-CM | POA: Diagnosis not present

## 2021-10-17 ENCOUNTER — Ambulatory Visit: Payer: 59 | Admitting: Internal Medicine

## 2021-10-23 ENCOUNTER — Encounter: Payer: Self-pay | Admitting: Internal Medicine

## 2021-11-05 ENCOUNTER — Encounter: Payer: Self-pay | Admitting: Internal Medicine

## 2021-11-05 ENCOUNTER — Ambulatory Visit: Payer: 59 | Admitting: Internal Medicine

## 2021-11-05 VITALS — BP 160/100 | HR 98 | Ht 64.0 in | Wt 238.4 lb

## 2021-11-05 DIAGNOSIS — E559 Vitamin D deficiency, unspecified: Secondary | ICD-10-CM

## 2021-11-05 DIAGNOSIS — E119 Type 2 diabetes mellitus without complications: Secondary | ICD-10-CM | POA: Diagnosis not present

## 2021-11-05 DIAGNOSIS — E785 Hyperlipidemia, unspecified: Secondary | ICD-10-CM | POA: Diagnosis not present

## 2021-11-05 LAB — POCT GLYCOSYLATED HEMOGLOBIN (HGB A1C): Hemoglobin A1C: 7 % — AB (ref 4.0–5.6)

## 2021-11-05 MED ORDER — LISINOPRIL 5 MG PO TABS: 5.0000 mg | ORAL_TABLET | Freq: Every day | ORAL | 3 refills | Status: DC

## 2021-11-05 MED ORDER — EMPAGLIFLOZIN 25 MG PO TABS: 25.0000 mg | ORAL_TABLET | Freq: Every day | ORAL | 3 refills | Status: DC

## 2021-11-05 NOTE — Patient Instructions (Addendum)
-   Continue Glipizide 10 mg, 1 tablet before Breakfast and half a tablet before Supper - Increase  Jardiance 25 mg, 1 tablet before Breakfast  - Continue  Crestor 5 mg Three times a week  - Continue Ergocalciferol 50,000  twice weekly   Start Lisinopril 5 mg daily for Blood pressure  Continue HCTZ 25 mg daily   Check with your primary care provider in 2 weeks    HOW TO TREAT LOW BLOOD SUGARS (Blood sugar LESS THAN 70 MG/DL) Please follow the RULE OF 15 for the treatment of hypoglycemia treatment (when your (blood sugars are less than 70 mg/dL)   STEP 1: Take 15 grams of carbohydrates when your blood sugar is low, which includes:  3-4 GLUCOSE TABS  OR 3-4 OZ OF JUICE OR REGULAR SODA OR ONE TUBE OF GLUCOSE GEL    STEP 2: RECHECK blood sugar in 15 MINUTES STEP 3: If your blood sugar is still low at the 15 minute recheck --> then, go back to STEP 1 and treat AGAIN with another 15 grams of carbohydrates.

## 2021-11-05 NOTE — Progress Notes (Unsigned)
Name: Krista Newton  Age/ Sex: 56 y.o., female   MRN/ DOB: [id number], 05/26/1965     PCP: Abner Greenspan, MD   Reason for Endocrinology Evaluation: Type 2 Diabetes Mellitus     Initial Endocrinology Clinic Visit: 08/01/2018      Newton IDENTIFIER: Krista Newton is a 56 y.o. female with a past medical history of HTN and T2DM. Krista Newton has followed with Endocrinology clinic since 08/01/2018 for consultative assistance with management of her diabetes.  DIABETIC HISTORY:  Krista Newton was diagnosed with T2DM in 2015. She reports intolerance to Metformin (Throat chemical burn ) . Her hemoglobin A1c has ranged from 7.3% in 2017, peaking at 10.0% in 2020.  On her initial visit to our clinic her A1c was 10.0 % and was on Glipizide.   Intolerant to Rybelsus 08/2018   SUBJECTIVE:   During Krista last visit (04/18/2021): A1c 6.8 % .Continued  Glipizide and continued jardiance   Today (11/05/2021): Krista Newton is here for a   follow up on diabetes management. She checks her blood sugars 2 times daily, preprandial to breakfast and supper. Krista Newton has noted  hypoglycemic episodes since Krista last clinic visit, in Krista evenings after supper     Denies nausea, vomiting or diarrhea   HOME ENDOCRINE REGIMEN:  Glipizide 10 mg, 1 tab before breakfast and half before supper Jardiance 10 mg, 1 tablet before Breakfast  Rosuvastatin 5 mg 3 times a week Ergocalciferol 50000 iu TWICE a week      GLUCOSE LOG :  Fasting 130-265 Evening 122-214 mg/d L   DIABETIC COMPLICATIONS: Microvascular complications:    Denies: CKD, neuropathy, retinopathy Last eye exam: Completed 05/22/2021     Macrovascular complications:    Denies: CAD, PVD, CVA   HISTORY:  Past Medical History:  Past Medical History:  Diagnosis Date   Allergy    Anemia    with pregnancy    COVID-19 virus infection 04/17/2019   Diabetes mellitus without complication ([place])    Hyperlipidemia    Hypertension    Past  Surgical History:  Past Surgical History:  Procedure Laterality Date   CARPAL TUNNEL RELEASE Bilateral 04/24/2014   CESAREAN SECTION     TUBAL LIGATION     Social History:  reports that she has never smoked. She has never used smokeless tobacco. She reports current alcohol use. She reports that she does not use drugs. Family History:  Family History  Problem Relation Age of Onset   Hypertension Mother    Diabetes Mother    Heart failure Mother    Hypertension Father    Diabetes Sister    Kidney failure Brother    Colon cancer Neg Hx    Colon polyps Neg Hx    Esophageal cancer Neg Hx    Rectal cancer Neg Hx    Stomach cancer Neg Hx      HOME MEDICATIONS: Allergies as of 11/05/2021       Reactions   Metformin And Related Other (See Comments)   Krista XR caused sore throat, HA, GI issues        Medication List        Accurate as of November 05, 2021  4:22 PM. If you have any questions, ask your nurse or doctor.          STOP taking these medications    cholecalciferol 25 MCG (1000 UNIT) tablet Commonly known as: VITAMIN D3 Stopped by: Dorita Sciara, MD  TAKE these medications    diclofenac 75 MG EC tablet Commonly known as: VOLTAREN Take 1 tablet (75 mg total) by mouth 2 (two) times daily.   glipiZIDE 10 MG tablet Commonly known as: GLUCOTROL Take 1 tablet (10 mg total) by mouth daily before breakfast AND 0.5 tablets (5 mg total) daily before supper.   hydrochlorothiazide 25 MG tablet Commonly known as: HYDRODIURIL Take 1 tablet (25 mg total) by mouth every other day.   Jardiance 10 MG Tabs tablet Generic drug: empagliflozin TAKE 1 TABLET BY MOUTH DAILY BEFORE BREAKFAST.   naproxen sodium 220 MG tablet Commonly known as: ALEVE Take 220 mg by mouth 2 (two) times daily as needed.   onetouch ultrasoft lancets Twice daily   OneTouch Verio Reflect w/Device Kit Use to check blood sugar 2 times a day   OneTouch Verio test strip Generic  drug: glucose blood USE AS INSTRUCTED TO CHECK BLOOD SUGAR 2 TIMES DAILY   rosuvastatin 5 MG tablet Commonly known as: Crestor Take 1 tablet (5 mg total) by mouth as directed. Four  times a week   Vitamin D (Ergocalciferol) 1.25 MG (50000 UNIT) Caps capsule Commonly known as: DRISDOL Take 1 capsule (50,000 Units total) by mouth 2 (two) times a week.        VITAL SIGNS: BP (!) 148/100   Pulse 98   Ht 5' 4"  (1.626 m)   Wt 238 lb 6.4 oz (108.1 kg)   SpO2 95%   BMI 40.92 kg/m     PHYSICAL EXAM:  General: Pt appears well and is in NAD  Neck: Thyroid: Thyroid size normal.  No goiter or nodules appreciated. No thyroid bruit.  Lungs: Clear with good BS bilat with no rales, rhonchi, or wheezes  Heart: RRR with normal S1 and S2 and no gallops; no murmurs; no rub  Abdomen: Normoactive bowel sounds, soft, nontender, without masses or organomegaly palpable  Extremities:   Lower extremities - No pretibial edema.   Neuro: MS is good with appropriate affect, pt is alert and Ox3      DM foot exam: 10/11/2020  Krista skin of Krista feet is intact without sores or ulcerations. Krista pedal pulses are 2+ on right and 2+ on left. Krista sensation is intact to a screening 5.07, 10 gram monofilament bilaterally    DATA REVIEWED:  Lab Results  Component Value Date   HGBA1C 7.0 (A) 11/05/2021   HGBA1C 6.8 (A) 04/18/2021   HGBA1C 6.7 (A) 10/11/2020    Latest Reference Range & Units 11/01/20 08:27  Sodium 135 - 145 mEq/L 139  Potassium 3.5 - 5.1 mEq/L 4.2  Chloride 96 - 112 mEq/L 107  CO2 19 - 32 mEq/L 24  Glucose 70 - 99 mg/dL 126 (H)  BUN 6 - 23 mg/dL 18  Creatinine 0.40 - 1.20 mg/dL 0.69  Calcium 8.4 - 10.5 mg/dL 9.3  Alkaline Phosphatase 39 - 117 U/L 69  Albumin 3.5 - 5.2 g/dL 4.1  AST 0 - 37 U/L 12  ALT 0 - 35 U/L 13  Total Protein 6.0 - 8.3 g/dL 6.8  Total Bilirubin 0.2 - 1.2 mg/dL 0.5  GFR >60.00 mL/min 96.79    Latest Reference Range & Units 11/01/20 08:27  Total CHOL/HDL Ratio   4  Cholesterol 0 - 200 mg/dL 233 (H)  HDL Cholesterol >39.00 mg/dL 52.20  LDL (calc) 0 - 99 mg/dL 154 (H)  NonHDL  181.11  Triglycerides 0.0 - 149.0 mg/dL 137.0  VLDL 0.0 - 40.0 mg/dL 27.4  VITD 30.00 - 100.00 ng/mL 17.58 (L)    In ofice BG 144 mg/dL   ASSESSMENT / PLAN / RECOMMENDATIONS:   1) Type 2 Diabetes Mellitus, Optimally controlled, Without complications - Most recent A1c of 7.0 %. Goal A1c < 7.0 %.    - A1c stable  - Intolerant to Metformin - Developed UTI to jardiance in 2020 , but currently tolerating well Krista small dose , reluctant to increase Krista dose at this time -She has had 4 episode of hypoglycemic episodes after supper, we discussed reducing glipizide to half a tablet with supper we discussed Krista importance of limiting carbohydrate intake as well as staying consistent with that.  I explained to Krista Newton that she may notice hypoglycemia in Krista morning with increased CHO intake and reducing glipizide -She may increase glipizide to 1 tablet if she is going to eat larger than usual meal   MEDICATIONS:  -Change glipizide 10 mg, 1 tablet before Breakfast and half tablet before Supper - Continue Jardiance 10 mg, 1 tablet before Breakfast    EDUCATION / INSTRUCTIONS: BG monitoring instructions: Newton is instructed to check her blood sugars 2 times a day, fasting and supper. Call [facility] if: BG persistently < 70  I reviewed Krista Rule of 15 for Krista treatment of hypoglycemia in detail with Krista Newton. Literature supplied.  2) Diabetic complications:  Eye: Does not have known diabetic retinopathy.  Neuro/ Feet: Does not have known diabetic peripheral neuropathy. Renal: Newton does not have known baseline CKD. She is not on an ACEI/ARB at present.     3) Dyslipidemia :  - Intolerant to daily rosuvastatin due to myalgia,she has not been compliant with  3x weekly  Of rosuvastatin - She has declined  PCSK-9 inhibitors in Krista past  - Today she  would like to hold off on Zetia and will work on lifestyle changes     Medications Continue  Rosuvastatin 5 mg , three  times a week   4) Vitamin D deficiency :  - This was as low as 10.58 in  2020 .  Vitamin D continues to be low - OTC vitamin D caused hyperglycemia      Increase Ergocalciferol 50000 iu twice weekly     F/U in 6  months    Signed electronically by: Mack Guise, MD  [facility]  [facility] [address] [place], [place] [address] Phone: [phone] FAX: [phone]   CC: [place], Wynelle Fanny, MD [place] [address] Phone: [phone]  Fax: [phone]  Return to Endocrinology clinic as below: No future appointments.

## 2021-11-06 LAB — BASIC METABOLIC PANEL
BUN: 17 mg/dL (ref 6–23)
CO2: 25 mEq/L (ref 19–32)
Calcium: 9.7 mg/dL (ref 8.4–10.5)
Chloride: 103 mEq/L (ref 96–112)
Creatinine, Ser: 0.81 mg/dL (ref 0.40–1.20)
GFR: 80.38 mL/min (ref >60.00)
Glucose, Bld: 145 mg/dL — ABNORMAL HIGH (ref 70–99)
Potassium: 3.9 mEq/L (ref 3.5–5.1)
Sodium: 137 mEq/L (ref 135–145)

## 2021-11-06 LAB — LIPID PANEL
Cholesterol: 260 mg/dL — ABNORMAL HIGH (ref 0–200)
HDL: 58.7 mg/dL (ref >39.00)
LDL Cholesterol: 170 mg/dL — ABNORMAL HIGH (ref 0–99)
NonHDL: 200.99
Total CHOL/HDL Ratio: 4
Triglycerides: 156 mg/dL — ABNORMAL HIGH (ref 0.0–149.0)
VLDL: 31.2 mg/dL (ref 0.0–40.0)

## 2021-11-06 LAB — VITAMIN D 25 HYDROXY (VIT D DEFICIENCY, FRACTURES): VITD: 50.59 ng/mL (ref 30.00–100.00)

## 2021-11-06 LAB — TSH: TSH: 2.68 u[IU]/mL (ref 0.35–5.50)

## 2021-11-25 ENCOUNTER — Other Ambulatory Visit: Payer: Self-pay | Admitting: Family Medicine

## 2021-11-25 DIAGNOSIS — I10 Essential (primary) hypertension: Secondary | ICD-10-CM

## 2021-11-25 DIAGNOSIS — E1165 Type 2 diabetes mellitus with hyperglycemia: Secondary | ICD-10-CM

## 2021-11-25 DIAGNOSIS — E1169 Type 2 diabetes mellitus with other specified complication: Secondary | ICD-10-CM

## 2021-11-25 NOTE — Telephone Encounter (Signed)
She had a lot of lab work in June with her endocrinologist  I am ok with skipping labs and discussing what she had done in June  If she wants [place] to re check chemistries or cholesterol I can order it

## 2021-11-25 NOTE — Telephone Encounter (Signed)
Pt has made an appt for cpe and labs she is  wants come in 12/17/21 to have her  lab done.

## 2021-11-26 NOTE — Telephone Encounter (Signed)
Called and lvm for pt to call us back.

## 2021-12-17 ENCOUNTER — Telehealth: Payer: Self-pay | Admitting: Family Medicine

## 2021-12-17 ENCOUNTER — Other Ambulatory Visit: Payer: 59

## 2021-12-17 DIAGNOSIS — E559 Vitamin D deficiency, unspecified: Secondary | ICD-10-CM

## 2021-12-17 DIAGNOSIS — E1165 Type 2 diabetes mellitus with hyperglycemia: Secondary | ICD-10-CM

## 2021-12-17 DIAGNOSIS — E1169 Type 2 diabetes mellitus with other specified complication: Secondary | ICD-10-CM

## 2021-12-17 DIAGNOSIS — I10 Essential (primary) hypertension: Secondary | ICD-10-CM

## 2021-12-17 NOTE — Telephone Encounter (Signed)
-----   Message from Ellamae Sia sent at 12/01/2021  3:22 PM EDT ----- Regarding: Lab orders for Thursday, 8.3.23 Patient is scheduled for CPX labs, please order future labs, Thanks , Karna Christmas

## 2021-12-18 ENCOUNTER — Other Ambulatory Visit: Payer: 59

## 2021-12-23 ENCOUNTER — Encounter: Payer: 59 | Admitting: Family Medicine

## 2022-01-05 ENCOUNTER — Other Ambulatory Visit (INDEPENDENT_AMBULATORY_CARE_PROVIDER_SITE_OTHER): Payer: 59

## 2022-01-05 DIAGNOSIS — E559 Vitamin D deficiency, unspecified: Secondary | ICD-10-CM | POA: Diagnosis not present

## 2022-01-05 DIAGNOSIS — E785 Hyperlipidemia, unspecified: Secondary | ICD-10-CM

## 2022-01-05 DIAGNOSIS — I10 Essential (primary) hypertension: Secondary | ICD-10-CM | POA: Diagnosis not present

## 2022-01-05 DIAGNOSIS — E1169 Type 2 diabetes mellitus with other specified complication: Secondary | ICD-10-CM | POA: Diagnosis not present

## 2022-01-05 LAB — LIPID PANEL
Cholesterol: 247 mg/dL — ABNORMAL HIGH (ref 0–200)
HDL: 54.6 mg/dL (ref >39.00)
LDL Cholesterol: 174 mg/dL — ABNORMAL HIGH (ref 0–99)
NonHDL: 192.42
Total CHOL/HDL Ratio: 5
Triglycerides: 91 mg/dL (ref 0.0–149.0)
VLDL: 18.2 mg/dL (ref 0.0–40.0)

## 2022-01-05 LAB — COMPREHENSIVE METABOLIC PANEL
ALT: 14 U/L (ref 0–35)
AST: 14 U/L (ref 0–37)
Albumin: 4.1 g/dL (ref 3.5–5.2)
Alkaline Phosphatase: 69 U/L (ref 39–117)
BUN: 16 mg/dL (ref 6–23)
CO2: 24 mEq/L (ref 19–32)
Calcium: 9.1 mg/dL (ref 8.4–10.5)
Chloride: 103 mEq/L (ref 96–112)
Creatinine, Ser: 0.65 mg/dL (ref 0.40–1.20)
GFR: 97.38 mL/min (ref >60.00)
Glucose, Bld: 139 mg/dL — ABNORMAL HIGH (ref 70–99)
Potassium: 3.8 mEq/L (ref 3.5–5.1)
Sodium: 138 mEq/L (ref 135–145)
Total Bilirubin: 0.8 mg/dL (ref 0.2–1.2)
Total Protein: 6.9 g/dL (ref 6.0–8.3)

## 2022-01-05 LAB — CBC WITH DIFFERENTIAL/PLATELET
Basophils Absolute: 0.1 10*3/uL (ref 0.0–0.1)
Basophils Relative: 0.8 % (ref 0.0–3.0)
Eosinophils Absolute: 0.2 10*3/uL (ref 0.0–0.7)
Eosinophils Relative: 2.7 % (ref 0.0–5.0)
HCT: 40.8 % (ref 36.0–46.0)
Hemoglobin: 13.7 g/dL (ref 12.0–15.0)
Lymphocytes Relative: 32.7 % (ref 12.0–46.0)
Lymphs Abs: 2.2 10*3/uL (ref 0.7–4.0)
MCHC: 33.6 g/dL (ref 30.0–36.0)
MCV: 90.9 fl (ref 78.0–100.0)
Monocytes Absolute: 0.6 10*3/uL (ref 0.1–1.0)
Monocytes Relative: 8.5 % (ref 3.0–12.0)
Neutro Abs: 3.7 10*3/uL (ref 1.4–7.7)
Neutrophils Relative %: 55.3 % (ref 43.0–77.0)
Platelets: 225 10*3/uL (ref 150.0–400.0)
RBC: 4.49 Mil/uL (ref 3.87–5.11)
RDW: 13.7 % (ref 11.5–15.5)
WBC: 6.7 10*3/uL (ref 4.0–10.5)

## 2022-01-05 LAB — TSH: TSH: 1.36 u[IU]/mL (ref 0.35–5.50)

## 2022-01-05 LAB — VITAMIN D 25 HYDROXY (VIT D DEFICIENCY, FRACTURES): VITD: 37.16 ng/mL (ref 30.00–100.00)

## 2022-01-12 ENCOUNTER — Encounter: Payer: Self-pay | Admitting: Internal Medicine

## 2022-01-12 ENCOUNTER — Encounter: Payer: Self-pay | Admitting: Family Medicine

## 2022-01-12 ENCOUNTER — Ambulatory Visit (INDEPENDENT_AMBULATORY_CARE_PROVIDER_SITE_OTHER): Payer: 59 | Admitting: Family Medicine

## 2022-01-12 VITALS — BP 142/88 | HR 78 | Temp 97.8°F | Ht 64.0 in | Wt 232.1 lb

## 2022-01-12 DIAGNOSIS — E1165 Type 2 diabetes mellitus with hyperglycemia: Secondary | ICD-10-CM

## 2022-01-12 DIAGNOSIS — Z0000 Encounter for general adult medical examination without abnormal findings: Secondary | ICD-10-CM

## 2022-01-12 DIAGNOSIS — E1169 Type 2 diabetes mellitus with other specified complication: Secondary | ICD-10-CM | POA: Diagnosis not present

## 2022-01-12 DIAGNOSIS — I10 Essential (primary) hypertension: Secondary | ICD-10-CM | POA: Diagnosis not present

## 2022-01-12 DIAGNOSIS — E559 Vitamin D deficiency, unspecified: Secondary | ICD-10-CM

## 2022-01-12 DIAGNOSIS — E785 Hyperlipidemia, unspecified: Secondary | ICD-10-CM

## 2022-01-12 NOTE — Patient Instructions (Addendum)
Aim for 30 minutes of exercise per day  Walking is good but watch out for the heat   If you are interested in the shingles vaccine series (Shingrix), call your insurance or pharmacy to check on coverage and location it must be given.  If affordable - you can schedule it here or at your pharmacy depending on coverage   Get your flu shot in the fall   Make sure to schedule your mammogram at the breast center   Other options for your cholesterol include generic zetia and repatha  Read the handouts and tell us if you want to try   Tell your endocrinologist about side effects of the jardiance   Losartan is another option for blood pressure and kidney protection  Look at the handout and let us know if you want to try it

## 2022-01-12 NOTE — Assessment & Plan Note (Signed)
Sees endocrinology a1c last 7.0  On glipizide and jardiance Having side eff to this dose of jardiance and plans to d/w px physician

## 2022-01-12 NOTE — Assessment & Plan Note (Signed)
Reviewed health habits including diet and exercise and skin cancer prevention Reviewed appropriate screening tests for age  Also reviewed health mt list, fam hx and immunization status , as well as social and family history   See HPI Labs reviewed  Declines shingrix  utd gyn care and had a dexa there Plans to sched her mammogram Eye exam utd colonsocopy utd 2021 with 7 y recall  Pap utd 06/2021 Enc flu shot in the fall

## 2022-01-12 NOTE — Assessment & Plan Note (Signed)
D level 37.1 Vitamin D level is therapeutic with current supplementation Disc importance of this to bone and overall health Takes high dose px twice weekly from endoc

## 2022-01-12 NOTE — Assessment & Plan Note (Signed)
bp in fair control at this time  BP Readings from Last 1 Encounters:  01/12/22 (!) 142/88   Not opt controlled  Pt is certain she will have side eff to any new meds and she declines  Given info on losartan to consider for this and renal prot Declines ace Continues hctz QOD Most recent labs reviewed  Disc lifstyle change with low sodium diet and exercise

## 2022-01-12 NOTE — Progress Notes (Signed)
Subjective:    Patient ID: Krista Newton, female    DOB: 1966-02-12, 56 y.o.   MRN: [id number]  HPI Here for health maintenance exam and to review chronic medical problems    Wt Readings from Last 3 Encounters:  01/12/22 232 lb 2 oz (105.3 kg)  11/05/21 238 lb 6.4 oz (108.1 kg)  06/25/21 235 lb (106.6 kg)   39.84 kg/m  Feels ok  Taking care of herself   Lost her job in [place] , it got phased out  Taking courses and now job hunts  Does not want to start at low pay  At home too much - gets her off task for eating   Is going back to 2 meals per day  Is ready to make some changes   Is certain she will have side eff from any new medications   Immunization History  Administered Date(s) Administered   Hepatitis B, adult 01/09/2016, 02/11/2016, 07/14/2016   Influenza,inj,Quad PF,6+ Mos 06/07/2015, 02/11/2016   Influenza-Unspecified 02/12/2018, 03/02/2019   PFIZER(Purple Top)SARS-COV-2 Vaccination 08/12/2019, 09/06/2019, 08/23/2020   PPD Test 06/05/2015, 05/23/2019   Pneumococcal Conjugate-13 02/16/2012   Td 10/14/2005   Tdap 01/22/2016   Health Maintenance Due  Topic Date Due   Zoster Vaccines- Shingrix (1 of 2) Never done   COVID-19 Vaccine (4 - Pfizer series) 10/18/2020   MAMMOGRAM  06/12/2021   FOOT EXAM  11/08/2021   INFLUENZA VACCINE  12/16/2021   Shingrix: not interested   Flu shot: will get in the fall   Mammogram 05/2020- goes to gyn, needs to get that schedule  Self breast exam : nl   Had a dexa at gyn  Was normal   Eye exam 05/2021   Pap 06/2021 with gyn  Colonoscopy 07/2019 with 7 y recall   HTN BP Readings from Last 3 Encounters:  01/12/22 (!) 142/88  11/05/21 (!) 160/100  06/25/21 116/80    bp is stable today  Is always in 130s at home  No cp or palpitations or headaches or edema  No side effects to medicines    Hctz 25 mg every other day  Afraid of ace  Endocrinologist considered losartan    DM2  last a1c of 7.0  Sees  endocrinology Glipizide Jardiance (causes a sore throat) -she will check in with her endo about that    Hyperlipidemia Lab Results  Component Value Date   CHOL 247 (H) 01/05/2022   CHOL 260 (H) 11/05/2021   CHOL 233 (H) 11/01/2020   Lab Results  Component Value Date   HDL 54.60 01/05/2022   HDL 58.70 11/05/2021   HDL 52.20 11/01/2020   Lab Results  Component Value Date   LDLCALC 174 (H) 01/05/2022   LDLCALC 170 (H) 11/05/2021   LDLCALC 154 (H) 11/01/2020   Lab Results  Component Value Date   TRIG 91.0 01/05/2022   TRIG 156.0 (H) 11/05/2021   TRIG 137.0 11/01/2020   Lab Results  Component Value Date   CHOLHDL 5 01/05/2022   CHOLHDL 4 11/05/2021   CHOLHDL 4 11/01/2020   No results found for: "LDLDIRECT"  Takes crestor 5 mg intermittent when she tolerates it  Not at goal  Declines pcyk9 Declines zetia   The 10-year ASCVD risk score (Arnett DK, et al., 2019) is: 23.1%   Values used to calculate the score:     Age: 56 years     Sex: Female     Is Non-Hispanic African American: Yes  Diabetic: Yes     Tobacco smoker: No     Systolic Blood Pressure: 974 mmHg     Is BP treated: Yes     HDL Cholesterol: 54.6 mg/dL     Total Cholesterol: 247 mg/dL   Vit D def  Level of 37.1 now  Takes 50.000 iu twice weekly from endo    Patient Active Problem List   Diagnosis Date Noted   Left knee pain 11/22/2020   Dyslipidemia 12/08/2019   Need for hepatitis C screening test 05/15/2019   Encounter for screening for HIV 05/15/2019   Screening-pulmonary TB 05/15/2019   Colon cancer screening 05/15/2019   Vitamin D deficiency 11/22/2018   Numbness and tingling sensation of skin 10/04/2018   Neck pain 08/18/2017   Productive cough 08/18/2017   Exposure to communicable disease 01/19/2016   Morbid obesity ([place]) 07/07/2015   Essential hypertension 07/05/2015   Hyperlipidemia associated with type 2 diabetes mellitus ([place]) 08/31/2014   Type 2 diabetes mellitus with  hyperglycemia, without long-term current use of insulin ([place]) 02/15/2014   Leg cramps 02/06/2014   History of shingles 09/18/2013   Carpal tunnel syndrome 05/31/2013   Routine general medical examination at a health care facility 07/04/2012   HEARING LOSS, RIGHT EAR 07/26/2008   ALLERGIC RHINITIS 06/28/2008   Past Medical History:  Diagnosis Date   Allergy    Anemia    with pregnancy    COVID-19 virus infection 04/17/2019   Diabetes mellitus without complication ([place])    Hyperlipidemia    Hypertension    Past Surgical History:  Procedure Laterality Date   CARPAL TUNNEL RELEASE Bilateral 04/24/2014   CESAREAN SECTION     TUBAL LIGATION     Social History   Tobacco Use   Smoking status: Never   Smokeless tobacco: Never  Vaping Use   Vaping Use: Never used  Substance Use Topics   Alcohol use: Yes    Alcohol/week: 0.0 standard drinks of alcohol    Comment: Rare   Drug use: No   Family History  Problem Relation Age of Onset   Hypertension Mother    Diabetes Mother    Heart failure Mother    Hypertension Father    Diabetes Sister    Kidney failure Brother    Colon cancer Neg Hx    Colon polyps Neg Hx    Esophageal cancer Neg Hx    Rectal cancer Neg Hx    Stomach cancer Neg Hx    Allergies  Allergen Reactions   Metformin And Related Other (See Comments)    The XR caused sore throat, HA, GI issues   Current Outpatient Medications on File Prior to Visit  Medication Sig Dispense Refill   Blood Glucose Monitoring Suppl (ONETOUCH VERIO REFLECT) w/Device KIT Use to check blood sugar 2 times a day 1 kit 0   diclofenac (VOLTAREN) 75 MG EC tablet Take 1 tablet (75 mg total) by mouth 2 (two) times daily. 60 tablet 1   empagliflozin (JARDIANCE) 25 MG TABS tablet Take 1 tablet (25 mg total) by mouth daily before breakfast. 90 tablet 3   glipiZIDE (GLUCOTROL) 10 MG tablet Take 1 tablet (10 mg total) by mouth daily before breakfast AND 0.5 tablets (5 mg total) daily before  supper. 135 tablet 3   hydrochlorothiazide (HYDRODIURIL) 25 MG tablet TAKE 1 TABLET BY MOUTH EVERY OTHER DAY 15 tablet 3   Lancets (ONETOUCH ULTRASOFT) lancets Twice daily 100 each 12   naproxen sodium (ALEVE) 220 MG  tablet Take 220 mg by mouth 2 (two) times daily as needed.     ONETOUCH VERIO test strip USE AS INSTRUCTED TO CHECK BLOOD SUGAR 2 TIMES DAILY 100 strip 12   rosuvastatin (CRESTOR) 5 MG tablet Take 1 tablet (5 mg total) by mouth as directed. Four  times a week 52 tablet 3   Vitamin D, Ergocalciferol, (DRISDOL) 1.25 MG (50000 UNIT) CAPS capsule Take 1 capsule (50,000 Units total) by mouth 2 (two) times a week. 26 capsule 3   No current facility-administered medications on file prior to visit.     Review of Systems  Constitutional:  Positive for fatigue. Negative for activity change, appetite change, fever and unexpected weight change.  HENT:  Negative for congestion, ear pain, rhinorrhea, sinus pressure and sore throat.   Eyes:  Negative for pain, redness and visual disturbance.  Respiratory:  Negative for cough, shortness of breath and wheezing.   Cardiovascular:  Negative for chest pain and palpitations.  Gastrointestinal:  Negative for abdominal pain, blood in stool, constipation and diarrhea.  Endocrine: Negative for polydipsia and polyuria.  Genitourinary:  Negative for dysuria, frequency and urgency.  Musculoskeletal:  Negative for arthralgias, back pain and myalgias.  Skin:  Negative for pallor and rash.  Allergic/Immunologic: Negative for environmental allergies.  Neurological:  Negative for dizziness, syncope and headaches.  Hematological:  Negative for adenopathy. Does not bruise/bleed easily.  Psychiatric/Behavioral:  Negative for decreased concentration and dysphoric mood. The patient is not nervous/anxious.        Objective:   Physical Exam Constitutional:      General: She is not in acute distress.    Appearance: Normal appearance. She is well-developed. She  is obese. She is not ill-appearing or diaphoretic.  HENT:     Head: Normocephalic and atraumatic.     Right Ear: Tympanic membrane, ear canal and external ear normal.     Left Ear: Tympanic membrane, ear canal and external ear normal.     Nose: Nose normal. No congestion.     Mouth/Throat:     Mouth: Mucous membranes are moist.     Pharynx: Oropharynx is clear. No posterior oropharyngeal erythema.  Eyes:     General: No scleral icterus.    Extraocular Movements: Extraocular movements intact.     Conjunctiva/sclera: Conjunctivae normal.     Pupils: Pupils are equal, round, and reactive to light.  Neck:     Thyroid: No thyromegaly.     Vascular: No carotid bruit or JVD.  Cardiovascular:     Rate and Rhythm: Normal rate and regular rhythm.     Pulses: Normal pulses.     Heart sounds: Normal heart sounds.     No gallop.  Pulmonary:     Effort: Pulmonary effort is normal. No respiratory distress.     Breath sounds: Normal breath sounds. No wheezing.     Comments: Good air exch Chest:     Chest wall: No tenderness.  Abdominal:     General: Bowel sounds are normal. There is no distension or abdominal bruit.     Palpations: Abdomen is soft. There is no mass.     Tenderness: There is no abdominal tenderness.     Hernia: No hernia is present.  Genitourinary:    Comments: Breast and pelvic exam done by gynecologist   Musculoskeletal:        General: No tenderness. Normal range of motion.     Cervical back: Normal range of motion and neck supple. No rigidity.  No muscular tenderness.     Right lower leg: No edema.     Left lower leg: No edema.     Comments: No kyphosis   Lymphadenopathy:     Cervical: No cervical adenopathy.  Skin:    General: Skin is warm and dry.     Coloration: Skin is not pale.     Findings: No erythema or rash.  Neurological:     Mental Status: She is alert. Mental status is at baseline.     Cranial Nerves: No cranial nerve deficit.     Motor: No abnormal  muscle tone.     Coordination: Coordination normal.     Gait: Gait normal.     Deep Tendon Reflexes: Reflexes are normal and symmetric. Reflexes normal.  Psychiatric:        Mood and Affect: Mood normal.        Cognition and Memory: Cognition and memory normal.           Assessment & Plan:   Problem List Items Addressed This Visit       Cardiovascular and Mediastinum   Essential hypertension    bp in fair control at this time  BP Readings from Last 1 Encounters:  01/12/22 (!) 142/88  Not opt controlled  Pt is certain she will have side eff to any new meds and she declines  Given info on losartan to consider for this and renal prot Declines ace Continues hctz QOD Most recent labs reviewed  Disc lifstyle change with low sodium diet and exercise          Endocrine   Hyperlipidemia associated with type 2 diabetes mellitus ([place])    Disc goals for lipids and reasons to control them Rev last labs with pt Rev low sat fat diet in detail LDL up to 174 occ takes crestor intermittently but she has side eff Declines pcyk9 Declines zetia Handouts given to her on both in case she reconsiders ASCVD risck is high - 23.1%   Discussed this       Type 2 diabetes mellitus with hyperglycemia, without long-term current use of insulin ([place])    Sees endocrinology a1c last 7.0  On glipizide and jardiance Having side eff to this dose of jardiance and plans to d/w px physician        Other   Morbid obesity ([place])    Discussed how this problem influences overall health and the risks it imposes  Reviewed plan for weight loss with lower calorie diet (via better food choices and also portion control or program like weight watchers) and exercise building up to or more than 30 minutes 5 days per week including some aerobic activity   Pt may be open to trying some exercise       Routine general medical examination at a health care facility - Primary    Reviewed health habits including  diet and exercise and skin cancer prevention Reviewed appropriate screening tests for age  Also reviewed health mt list, fam hx and immunization status , as well as social and family history   See HPI Labs reviewed  Declines shingrix  utd gyn care and had a dexa there Plans to sched her mammogram Eye exam utd colonsocopy utd 2021 with 7 y recall  Pap utd 06/2021 Enc flu shot in the fall      Vitamin D deficiency    D level 37.1 Vitamin D level is therapeutic with current supplementation Disc importance of this to bone  and overall health Takes high dose px twice weekly from endoc

## 2022-01-12 NOTE — Assessment & Plan Note (Signed)
Disc goals for lipids and reasons to control them Rev last labs with pt Rev low sat fat diet in detail LDL up to 174 occ takes crestor intermittently but she has side eff Declines pcyk9 Declines zetia Handouts given to her on both in case she reconsiders ASCVD risck is high - 23.1%   Discussed this

## 2022-01-12 NOTE — Assessment & Plan Note (Signed)
Discussed how this problem influences overall health and the risks it imposes  Reviewed plan for weight loss with lower calorie diet (via better food choices and also portion control or program like weight watchers) and exercise building up to or more than 30 minutes 5 days per week including some aerobic activity   Pt may be open to trying some exercise

## 2022-01-22 ENCOUNTER — Encounter: Payer: Self-pay | Admitting: Family Medicine

## 2022-01-22 ENCOUNTER — Ambulatory Visit (INDEPENDENT_AMBULATORY_CARE_PROVIDER_SITE_OTHER): Payer: 59 | Admitting: Family Medicine

## 2022-01-22 VITALS — BP 136/84 | HR 78 | Temp 97.7°F | Ht 64.0 in | Wt 232.1 lb

## 2022-01-22 DIAGNOSIS — R300 Dysuria: Secondary | ICD-10-CM | POA: Diagnosis not present

## 2022-01-22 DIAGNOSIS — J029 Acute pharyngitis, unspecified: Secondary | ICD-10-CM | POA: Diagnosis not present

## 2022-01-22 LAB — POC URINALSYSI DIPSTICK (AUTOMATED)
Bilirubin, UA: NEGATIVE
Blood, UA: NEGATIVE
Glucose, UA: POSITIVE — AB
Ketones, UA: 15
Leukocytes, UA: NEGATIVE
Nitrite, UA: NEGATIVE
Protein, UA: POSITIVE — AB
Spec Grav, UA: 1.025 (ref 1.010–1.025)
Urobilinogen, UA: 0.2 E.U./dL
pH, UA: 6 (ref 5.0–8.0)

## 2022-01-22 LAB — CBC WITH DIFFERENTIAL/PLATELET
Basophils Absolute: 0 10*3/uL (ref 0.0–0.1)
Basophils Relative: 0.8 % (ref 0.0–3.0)
Eosinophils Absolute: 0.2 10*3/uL (ref 0.0–0.7)
Eosinophils Relative: 2.7 % (ref 0.0–5.0)
HCT: 41.9 % (ref 36.0–46.0)
Hemoglobin: 14.1 g/dL (ref 12.0–15.0)
Lymphocytes Relative: 35.7 % (ref 12.0–46.0)
Lymphs Abs: 2.1 10*3/uL (ref 0.7–4.0)
MCHC: 33.6 g/dL (ref 30.0–36.0)
MCV: 90.9 fl (ref 78.0–100.0)
Monocytes Absolute: 0.5 10*3/uL (ref 0.1–1.0)
Monocytes Relative: 7.8 % (ref 3.0–12.0)
Neutro Abs: 3.1 10*3/uL (ref 1.4–7.7)
Neutrophils Relative %: 53 % (ref 43.0–77.0)
Platelets: 232 10*3/uL (ref 150.0–400.0)
RBC: 4.61 Mil/uL (ref 3.87–5.11)
RDW: 13.5 % (ref 11.5–15.5)
WBC: 5.8 10*3/uL (ref 4.0–10.5)

## 2022-01-22 LAB — MONONUCLEOSIS SCREEN: Mono Screen: NEGATIVE

## 2022-01-22 LAB — POCT RAPID STREP A (OFFICE)

## 2022-01-22 MED ORDER — FLUCONAZOLE 150 MG PO TABS: 150.0000 mg | ORAL_TABLET | Freq: Once | ORAL | 0 refills | Status: AC

## 2022-01-22 NOTE — Progress Notes (Signed)
Subjective:    Patient ID: Krista Newton, female    DOB: 02-13-1966, 56 y.o.   MRN: [id number]  HPI Pt presents for ST and urinary symptoms and back pain   Wt Readings from Last 3 Encounters:  01/22/22 232 lb 2 oz (105.3 kg)  01/12/22 232 lb 2 oz (105.3 kg)  11/05/21 238 lb 6.4 oz (108.1 kg)   39.84 kg/m  Ever since she started jardiance her throat has been sore  Worse in the mornings  Some mucous -since she had covid a while back  Allergies years ago-not recently   No GERD symptoms  No cough  No wheezing  Did blow nose this am   Feels tired/worn out  Some days feels lousy     Took a covid test and it was negative   Yesterday the back of her neck hurt   Some pain in sides of low back  Pain to urinate - burning sensation  Frequency - bladder is very full in the am  Does not know if she is emptying 100% Drinking water  No blood in urine  Not cloudy   No vaginal discharge or itching    Went up on jardiance early august  On 10 mg she did fine but started having symptoms then at 33  Then endo told her to cut in 1/2 (since last visit)    Results for orders placed or performed in visit on 01/22/22  POCT Urinalysis Dipstick (Automated)  Result Value Ref Range   Color, UA Yellow    Clarity, UA Clear    Glucose, UA Positive (A) Negative   Bilirubin, UA Negative    Ketones, UA 15 mg/dL    Spec Grav, UA 1.025 1.010 - 1.025   Blood, UA Negative    pH, UA 6.0 5.0 - 8.0   Protein, UA Positive (A) Negative   Urobilinogen, UA 0.2 0.2 or 1.0 E.U./dL   Nitrite, UA Negative    Leukocytes, UA Negative Negative     Negative rapid strep test today   Patient Active Problem List   Diagnosis Date Noted   Sore throat 01/22/2022   Dysuria 01/22/2022   Left knee pain 11/22/2020   Dyslipidemia 12/08/2019   Need for hepatitis C screening test 05/15/2019   Encounter for screening for HIV 05/15/2019   Screening-pulmonary TB 05/15/2019   Colon cancer screening  05/15/2019   Vitamin D deficiency 11/22/2018   Numbness and tingling sensation of skin 10/04/2018   Neck pain 08/18/2017   Productive cough 08/18/2017   Exposure to communicable disease 01/19/2016   Morbid obesity ([place]) 07/07/2015   Essential hypertension 07/05/2015   Hyperlipidemia associated with type 2 diabetes mellitus ([place]) 08/31/2014   Type 2 diabetes mellitus with hyperglycemia, without long-term current use of insulin ([place]) 02/15/2014   Leg cramps 02/06/2014   History of shingles 09/18/2013   Carpal tunnel syndrome 05/31/2013   Routine general medical examination at a health care facility 07/04/2012   HEARING LOSS, RIGHT EAR 07/26/2008   ALLERGIC RHINITIS 06/28/2008   Past Medical History:  Diagnosis Date   Allergy    Anemia    with pregnancy    COVID-19 virus infection 04/17/2019   Diabetes mellitus without complication ([place])    Hyperlipidemia    Hypertension    Past Surgical History:  Procedure Laterality Date   CARPAL TUNNEL RELEASE Bilateral 04/24/2014   CESAREAN SECTION     TUBAL LIGATION     Social History   Tobacco Use  Smoking status: Never   Smokeless tobacco: Never  Vaping Use   Vaping Use: Never used  Substance Use Topics   Alcohol use: Yes    Alcohol/week: 0.0 standard drinks of alcohol    Comment: Rare   Drug use: No   Family History  Problem Relation Age of Onset   Hypertension Mother    Diabetes Mother    Heart failure Mother    Hypertension Father    Diabetes Sister    Kidney failure Brother    Colon cancer Neg Hx    Colon polyps Neg Hx    Esophageal cancer Neg Hx    Rectal cancer Neg Hx    Stomach cancer Neg Hx    Allergies  Allergen Reactions   Metformin And Related Other (See Comments)    The XR caused sore throat, HA, GI issues   Current Outpatient Medications on File Prior to Visit  Medication Sig Dispense Refill   Blood Glucose Monitoring Suppl (ONETOUCH VERIO REFLECT) w/Device KIT Use to check blood sugar 2 times a day  1 kit 0   diclofenac (VOLTAREN) 75 MG EC tablet Take 1 tablet (75 mg total) by mouth 2 (two) times daily. 60 tablet 1   empagliflozin (JARDIANCE) 25 MG TABS tablet Take 1 tablet (25 mg total) by mouth daily before breakfast. 90 tablet 3   glipiZIDE (GLUCOTROL) 10 MG tablet Take 1 tablet (10 mg total) by mouth daily before breakfast AND 0.5 tablets (5 mg total) daily before supper. 135 tablet 3   hydrochlorothiazide (HYDRODIURIL) 25 MG tablet TAKE 1 TABLET BY MOUTH EVERY OTHER DAY 15 tablet 3   Lancets (ONETOUCH ULTRASOFT) lancets Twice daily 100 each 12   naproxen sodium (ALEVE) 220 MG tablet Take 220 mg by mouth 2 (two) times daily as needed.     ONETOUCH VERIO test strip USE AS INSTRUCTED TO CHECK BLOOD SUGAR 2 TIMES DAILY 100 strip 12   rosuvastatin (CRESTOR) 5 MG tablet Take 1 tablet (5 mg total) by mouth as directed. Four  times a week 52 tablet 3   Vitamin D, Ergocalciferol, (DRISDOL) 1.25 MG (50000 UNIT) CAPS capsule Take 1 capsule (50,000 Units total) by mouth 2 (two) times a week. 26 capsule 3   No current facility-administered medications on file prior to visit.    Review of Systems  Constitutional:  Positive for fatigue. Negative for activity change, appetite change, fever and unexpected weight change.  HENT:  Positive for postnasal drip, sore throat and voice change. Negative for congestion, ear pain, rhinorrhea, sinus pressure and trouble swallowing.   Eyes:  Negative for pain, redness and visual disturbance.  Respiratory:  Negative for cough, shortness of breath and wheezing.   Cardiovascular:  Negative for chest pain and palpitations.  Gastrointestinal:  Negative for abdominal pain, blood in stool, constipation and diarrhea.  Endocrine: Negative for polydipsia and polyuria.  Genitourinary:  Positive for dysuria and frequency. Negative for urgency.  Musculoskeletal:  Negative for arthralgias, back pain and myalgias.  Skin:  Negative for pallor and rash.  Allergic/Immunologic:  Negative for environmental allergies.  Neurological:  Negative for dizziness, syncope and headaches.  Hematological:  Negative for adenopathy. Does not bruise/bleed easily.  Psychiatric/Behavioral:  Negative for decreased concentration and dysphoric mood. The patient is not nervous/anxious.        Objective:   Physical Exam Constitutional:      General: She is not in acute distress.    Appearance: Normal appearance. She is well-developed. She is obese. She  is not ill-appearing or diaphoretic.  HENT:     Head: Normocephalic and atraumatic.     Right Ear: Tympanic membrane and ear canal normal.     Left Ear: Tympanic membrane and ear canal normal.     Nose: No congestion or rhinorrhea.     Mouth/Throat:     Mouth: Mucous membranes are moist.     Pharynx: Oropharynx is clear. No pharyngeal swelling or oropharyngeal exudate.  Eyes:     General: No scleral icterus.       Right eye: No discharge.        Left eye: No discharge.     Conjunctiva/sclera: Conjunctivae normal.     Pupils: Pupils are equal, round, and reactive to light.  Neck:     Thyroid: No thyromegaly.     Vascular: No carotid bruit or JVD.     Comments: No adenopathy noted  Some muscular tenderness  Cardiovascular:     Rate and Rhythm: Normal rate and regular rhythm.     Heart sounds: Normal heart sounds.     No gallop.  Pulmonary:     Effort: Pulmonary effort is normal. No respiratory distress.     Breath sounds: Normal breath sounds. No wheezing or rales.  Abdominal:     General: There is no distension or abdominal bruit.     Palpations: Abdomen is soft.     Tenderness: There is no right CVA tenderness, left CVA tenderness, guarding or rebound.     Comments: Mild suprapubic tenderness without fullness  Musculoskeletal:     Cervical back: Normal range of motion and neck supple.     Right lower leg: No edema.     Left lower leg: No edema.  Lymphadenopathy:     Cervical: No cervical adenopathy.  Skin:     General: Skin is warm and dry.     Coloration: Skin is not jaundiced or pale.     Findings: No bruising or rash.  Neurological:     Mental Status: She is alert.     Coordination: Coordination normal.     Deep Tendon Reflexes: Reflexes are normal and symmetric. Reflexes normal.  Psychiatric:        Mood and Affect: Mood normal.           Assessment & Plan:   Problem List Items Addressed This Visit       Other   Dysuria    Urine dipped pos for glucose and ketones and protein  Taking jardiance for diabetes which inc her risk of uti and yeast vaginitis Culture pending inst to inc water intake  Px diflucan to take 150 mg once for yeast        Relevant Orders   POCT Urinalysis Dipstick (Automated) (Completed)   Urine Culture   Sore throat - Primary    Pt notes ST since going up on jardiance to 25 mg (formerly on 10), now feels some pain in neck as well Neg RST today  No evidence of thrush on exam and benign appearing throat exam No c/o GERD or throat clearing but has occ pnd  Cbc and mono tests today  inst to try claritin otc  If all negative and no imp consider 1 week hold of jardiance to see if this is the cause (explained this would be a very unusual side effect and I have not seen it prior)   ER precautions noted inst to call if symptoms worsen       Relevant  Orders   POCT rapid strep A (Completed)   CBC with Differential/Platelet   Mononucleosis screen

## 2022-01-22 NOTE — Assessment & Plan Note (Signed)
Urine dipped pos for glucose and ketones and protein  Taking jardiance for diabetes which inc her risk of uti and yeast vaginitis Culture pending inst to inc water intake  Px diflucan to take 150 mg once for yeast

## 2022-01-22 NOTE — Patient Instructions (Addendum)
Strep test is negative today   I want to check labs for cbc and mono   In case of allergies - you can try claritin 10 mg daily   Drink lots of fluids  Gargle with warm salt water   Take the diflucan for possible yeast vaginitis  Urine culture to look for uti is pending - we will contact you with result and to check in   If everything comes back negative and you still have symptoms we may need to try a week off Jardiance

## 2022-01-22 NOTE — Assessment & Plan Note (Signed)
Pt notes ST since going up on jardiance to 25 mg (formerly on 10), now feels some pain in neck as well Neg RST today  No evidence of thrush on exam and benign appearing throat exam No c/o GERD or throat clearing but has occ pnd  Cbc and mono tests today  inst to try claritin otc  If all negative and no imp consider 1 week hold of jardiance to see if this is the cause (explained this would be a very unusual side effect and I have not seen it prior)   ER precautions noted inst to call if symptoms worsen

## 2022-01-23 LAB — URINE CULTURE
MICRO NUMBER:: 13885509
SPECIMEN QUALITY:: ADEQUATE

## 2022-01-25 ENCOUNTER — Encounter: Payer: Self-pay | Admitting: Family Medicine

## 2022-02-04 ENCOUNTER — Encounter: Payer: Self-pay | Admitting: Family Medicine

## 2022-02-05 ENCOUNTER — Encounter: Payer: Self-pay | Admitting: Internal Medicine

## 2022-04-04 ENCOUNTER — Other Ambulatory Visit: Payer: Self-pay | Admitting: Family Medicine

## 2022-04-20 ENCOUNTER — Encounter: Payer: Self-pay | Admitting: Internal Medicine

## 2022-04-20 ENCOUNTER — Other Ambulatory Visit: Payer: Self-pay | Admitting: Internal Medicine

## 2022-04-21 ENCOUNTER — Other Ambulatory Visit: Payer: Self-pay

## 2022-05-05 ENCOUNTER — Encounter: Payer: Self-pay | Admitting: Family Medicine

## 2022-05-06 ENCOUNTER — Encounter: Payer: Self-pay | Admitting: Family Medicine

## 2022-05-06 ENCOUNTER — Encounter: Payer: Self-pay | Admitting: *Deleted

## 2022-05-06 ENCOUNTER — Telehealth: Payer: 59 | Admitting: Family Medicine

## 2022-05-06 ENCOUNTER — Telehealth (INDEPENDENT_AMBULATORY_CARE_PROVIDER_SITE_OTHER): Payer: 59 | Admitting: Family Medicine

## 2022-05-06 VITALS — BP 133/78 | HR 87 | Temp 99.9°F | Ht 64.0 in | Wt 232.0 lb

## 2022-05-06 DIAGNOSIS — U071 COVID-19: Secondary | ICD-10-CM

## 2022-05-06 MED ORDER — GUAIFENESIN-CODEINE 100-10 MG/5ML PO SYRP: 5.0000 mL | ORAL_SOLUTION | Freq: Every evening | ORAL | 0 refills | Status: DC | PRN

## 2022-05-06 MED ORDER — NIRMATRELVIR/RITONAVIR (PAXLOVID)TABLET: 3.0000 | ORAL_TABLET | Freq: Two times a day (BID) | ORAL | 0 refills | Status: AC

## 2022-05-06 NOTE — Telephone Encounter (Signed)
Virtual visit completed by Dr. Diona Browner

## 2022-05-06 NOTE — Progress Notes (Signed)
VIRTUAL VISIT A virtual visit is felt to be most appropriate for this patient at this time.   I connected with the patient on 05/06/22 at 11:20 AM EST by virtual telehealth platform and verified that I am speaking with the correct person using two identifiers.   I discussed the limitations, risks, security and privacy concerns of performing an evaluation and management service by  virtual telehealth platform and the availability of in person appointments. I also discussed with the patient that there may be a patient responsible charge related to this service. The patient expressed understanding and agreed to proceed.  Patient location: Home Provider Location: [facility] [place] Participants: Krista Newton and [name]   Chief Complaint  Patient presents with   Covid Positive    History of Present Illness: 56 year old female patient of [name] with history of type 2 diabetes, hypertension and morbid obesity who presents today with COVID infection.  Date of onset:  12/17 Initial symptoms included  dry cough Symptoms progressed to fatigue, cold chills, headache, body aches,increased cough, dry hacking. No SOB, no wheeze.  No ear pain, no sinus pain.  Cough keeping her up at night.   Sick contacts: yes, son COVID testing:   yes     She has tried to treat with  tylenol [name]. OTC tussin.    No history of chronic lung disease such as asthma or COPD. Non-smoker.   COVID 19 screen No recent travel or known exposure to COVID19 The patient denies respiratory symptoms of COVID 19 at this time.  The importance of social distancing was discussed today.   Review of Systems  Constitutional:  Positive for chills. Negative for fever.  HENT:  Positive for congestion. Negative for ear pain.   Eyes:  Negative for pain and redness.  Respiratory:  Positive for cough. Negative for shortness of breath.   Cardiovascular:  Negative for chest pain, palpitations and leg swelling.   Gastrointestinal:  Negative for abdominal pain, blood in stool, constipation, diarrhea, nausea and vomiting.  Genitourinary:  Negative for dysuria.  Musculoskeletal:  Negative for falls and myalgias.  Skin:  Negative for rash.  Neurological:  Negative for dizziness.  Psychiatric/Behavioral:  Negative for depression. The patient is not nervous/anxious.       Past Medical History:  Diagnosis Date   Allergy    Anemia    with pregnancy    COVID-19 virus infection 04/17/2019   Diabetes mellitus without complication ([place])    Hyperlipidemia    Hypertension     reports that she has never smoked. She has never used smokeless tobacco. She reports current alcohol use. She reports that she does not use drugs.   Current Outpatient Medications:    Blood Glucose Monitoring Suppl (ONETOUCH VERIO REFLECT) w/Device KIT, Use to check blood sugar 2 times a day, Disp: 1 kit, Rfl: 0   diclofenac (VOLTAREN) 75 MG EC tablet, Take 1 tablet (75 mg total) by mouth 2 (two) times daily., Disp: 60 tablet, Rfl: 1   glipiZIDE (GLUCOTROL) 10 MG tablet, TAKE 1 TABLET (10 MG TOTAL) DAILY BEFORE BREAKFAST AND 1/2 TABLET (5 MG TOTAL) DAILY BEFORE SUPPER., Disp: 135 tablet, Rfl: 1   hydrochlorothiazide (HYDRODIURIL) 25 MG tablet, TAKE 1 TABLET BY MOUTH EVERY OTHER DAY, Disp: 15 tablet, Rfl: 6   Lancets (ONETOUCH ULTRASOFT) lancets, Twice daily, Disp: 100 each, Rfl: 12   naproxen sodium (ALEVE) 220 MG tablet, Take 220 mg by mouth 2 (two) times daily as needed., Disp: ,  Rfl:    ONETOUCH VERIO test strip, USE AS INSTRUCTED TO CHECK BLOOD SUGAR 2 TIMES DAILY, Disp: 100 strip, Rfl: 12   rosuvastatin (CRESTOR) 5 MG tablet, Take 1 tablet (5 mg total) by mouth as directed. Four  times a week, Disp: 52 tablet, Rfl: 3   Vitamin D, Ergocalciferol, (DRISDOL) 1.25 MG (50000 UNIT) CAPS capsule, TAKE 1 CAPSULE (50,000 UNITS TOTAL) BY MOUTH TWO TIMES A WEEK, Disp: 8 capsule, Rfl: 12   Observations/Objective: Blood pressure 133/78,  pulse 87, temperature 99.9 F (37.7 C), temperature source Oral, height _0  (1.626 m), weight 232 lb (105.2 kg), SpO2 95 %.  Physical Exam  Physical Exam Constitutional:      General: The patient is not in acute distress. Pulmonary:     Effort: Pulmonary effort is normal. No respiratory distress.  Neurological:     Mental Status: The patient is alert and oriented to person, place, and time.  Psychiatric:        Mood and Affect: Mood normal.        Behavior: Behavior normal.   Assessment and Plan    Problem List Items Addressed This Visit     COVID-19 - Primary    COVID19  Infection < 5 days from onset of symptoms in  vaccinated overweight individual with history of HTN, DM, obesity  No clear sign of bacterial infection at this time.   No SOB.  No red flags/need for ER visit or in-person exam at respiratory clinic at this time..    Pt higher risk for COVID complications given  DM, HTN, obesity. GFR  97 and no medication contraindications other than statin which she will hold while taking paxlovid.  Start paxlovid 5 day course. Reviewed course of medication and side effect profile with patient in detail.   Symptomatic care with mucinex and cough suppressant at night. If SOB begins symptoms worsening.. have low threshold for in-person exam, if severe shortness of breath ER visit recommended.  Can monitor Oxygen saturation at home with home monitor if able to obtain.  Go to ER if O2 sat < 90% on room air.   Reviewed home care and provided information through [facility].  Recommended quarantine 5 days isolation recommended. Return to work day 6 and wear mask for 4 more days to complete 10 days. Provided info about prevention of spread of COVID 19.       Relevant Medications   nirmatrelvir/ritonavir (PAXLOVID) 20 x 150 MG & 10 x 100MG TABS    I discussed the assessment and treatment plan with the patient. The patient was provided an opportunity to ask questions and all were  answered. The patient agreed with the plan and demonstrated an understanding of the instructions.   The patient was advised to call back or seek an in-person evaluation if the symptoms worsen or if the condition fails to improve as anticipated.     Krista Lofts, MD

## 2022-05-06 NOTE — Assessment & Plan Note (Signed)
COVID19  Infection < 5 days from onset of symptoms in  vaccinated overweight individual with history of HTN, DM, obesity  No clear sign of bacterial infection at this time.   No SOB.  No red flags/need for ER visit or in-person exam at respiratory clinic at this time..    Pt higher risk for COVID complications given  DM, HTN, obesity. GFR  97 and no medication contraindications other than statin which she will hold while taking paxlovid.  Start paxlovid 5 day course. Reviewed course of medication and side effect profile with patient in detail.   Symptomatic care with mucinex and cough suppressant at night. If SOB begins symptoms worsening.. have low threshold for in-person exam, if severe shortness of breath ER visit recommended.  Can monitor Oxygen saturation at home with home monitor if able to obtain.  Go to ER if O2 sat < 90% on room air.   Reviewed home care and provided information through [facility].  Recommended quarantine 5 days isolation recommended. Return to work day 6 and wear mask for 4 more days to complete 10 days. Provided info about prevention of spread of COVID 19.

## 2022-05-06 NOTE — Telephone Encounter (Signed)
Patient called and stated she is available for the visit this morning. Patient was advise to take her temperature and blood pressure.

## 2022-05-12 ENCOUNTER — Encounter: Payer: Self-pay | Admitting: Family Medicine

## 2022-05-12 NOTE — Telephone Encounter (Signed)
Dr. Diona Browner out of the office this week

## 2022-05-13 ENCOUNTER — Ambulatory Visit: Payer: 59 | Admitting: Family Medicine

## 2022-05-15 ENCOUNTER — Ambulatory Visit: Payer: 59 | Admitting: Internal Medicine

## 2022-06-09 ENCOUNTER — Encounter: Payer: Self-pay | Admitting: Family Medicine

## 2022-06-09 ENCOUNTER — Ambulatory Visit (INDEPENDENT_AMBULATORY_CARE_PROVIDER_SITE_OTHER)
Admission: RE | Admit: 2022-06-09 | Discharge: 2022-06-09 | Disposition: A | Discharge status: Home / Self Care | Payer: 59 | Source: Ambulatory Visit | Attending: Family Medicine | Admitting: Family Medicine

## 2022-06-09 ENCOUNTER — Ambulatory Visit: Payer: 59 | Admitting: Family Medicine

## 2022-06-09 VITALS — BP 126/70 | HR 101 | Temp 97.6°F | Ht 64.0 in | Wt 234.1 lb

## 2022-06-09 DIAGNOSIS — I10 Essential (primary) hypertension: Secondary | ICD-10-CM

## 2022-06-09 DIAGNOSIS — R079 Chest pain, unspecified: Secondary | ICD-10-CM

## 2022-06-09 DIAGNOSIS — R0789 Other chest pain: Secondary | ICD-10-CM | POA: Insufficient documentation

## 2022-06-09 MED ORDER — FAMOTIDINE 20 MG PO TABS: 20.0000 mg | ORAL_TABLET | Freq: Two times a day (BID) | ORAL | 1 refills | Status: DC

## 2022-06-09 NOTE — Progress Notes (Signed)
Subjective:    Patient ID: Krista Newton, female    DOB: 10-Mar-1966, 56 y.o.   MRN: [id number]  HPI Pt presents with pain under R breast in chest   Wt Readings from Last 3 Encounters:  06/09/22 234 lb 2 oz (106.2 kg)  05/06/22 232 lb (105.2 kg)  01/22/22 232 lb 2 oz (105.3 kg)   40.19 kg/m  Vitals:   06/09/22 1022  BP: 126/70  Pulse: (!) 101  Temp: 97.6 F (36.4 C)  SpO2: 98%   Started Sunday even Had [place] food but not spicy No heartburn but did start burping (that comes and goes)  No abd pain at all   No nausea No diarrhea  Is gassy   Took some antacid / unsure if they helped   Has prn diclofenac or naproxen  Not recent  Took an asa last night    Pain/ deep in chest (started on R behind breast) felt initially like a pulled muscle  At times feels like a catch  Cannot get comfortable- esp in bed  Now it moved to the middle   Worse with certain movement or positions  Worse when lying in bed  Not worse with exertion   Comes and goes   Not constant pain and not sharp Not a pressure sensatoin   Reminds her when she had pneumonia in the past    No fever  Phlegm at times - clear to hint of yellow  Coughed a bit this am No fever Feels a bit blah  Once a spec of blood 2 d ago No wheezing No sob  No tight breathing   Never smoked  Is around smokers    Pt had covid in December -she did cough a lot  Took anti viral medicine   Has naproxen on her med list   Mammogram 05/2020  BP Readings from Last 3 Encounters:  06/09/22 126/70  05/06/22 133/78  01/22/22 136/84   Pulse Readings from Last 3 Encounters:  06/09/22 (!) 101  05/06/22 87  01/22/22 78   Hctz 25 mg every other day    Lab Results  Component Value Date   CHOL 247 (H) 01/05/2022   HDL 54.60 01/05/2022   LDLCALC 174 (H) 01/05/2022   TRIG 91.0 01/05/2022   CHOLHDL 5 01/05/2022   Crestor 5 mg four times per week  DM2 Sees endocrinologist   Cxr today DG Chest 2  View  Result Date: 06/09/2022 CLINICAL DATA:  Chest pain EXAM: CHEST - 2 VIEW COMPARISON:  08/18/2017 FINDINGS: The heart size and mediastinal contours are within normal limits. Both lungs are clear. The visualized skeletal structures are unremarkable. IMPRESSION: No active cardiopulmonary disease. Electronically Signed   By: Davina Poke D.O.   On: 06/09/2022 11:36    Patient Active Problem List   Diagnosis Date Noted   Atypical chest pain 06/09/2022   Left knee pain 11/22/2020   Need for hepatitis C screening test 05/15/2019   Encounter for screening for HIV 05/15/2019   Screening-pulmonary TB 05/15/2019   Colon cancer screening 05/15/2019   Vitamin D deficiency 11/22/2018   Numbness and tingling sensation of skin 10/04/2018   Neck pain 08/18/2017   Productive cough 08/18/2017   Morbid obesity ([place]) 07/07/2015   Essential hypertension 07/05/2015   Hyperlipidemia associated with type 2 diabetes mellitus ([place]) 08/31/2014   Type 2 diabetes mellitus with hyperglycemia, without long-term current use of insulin ([place]) 02/15/2014   Leg cramps 02/06/2014   History of  shingles 09/18/2013   Carpal tunnel syndrome 05/31/2013   Routine general medical examination at a health care facility 07/04/2012   HEARING LOSS, RIGHT EAR 07/26/2008   ALLERGIC RHINITIS 06/28/2008   Past Medical History:  Diagnosis Date   Allergy    Anemia    with pregnancy    COVID-19 virus infection 04/17/2019   Diabetes mellitus without complication ([place])    Hyperlipidemia    Hypertension    Past Surgical History:  Procedure Laterality Date   CARPAL TUNNEL RELEASE Bilateral 04/24/2014   CESAREAN SECTION     TUBAL LIGATION     Social History   Tobacco Use   Smoking status: Never   Smokeless tobacco: Never  Vaping Use   Vaping Use: Never used  Substance Use Topics   Alcohol use: Yes    Alcohol/week: 0.0 standard drinks of alcohol    Comment: Rare   Drug use: No   Family History  Problem Relation  Age of Onset   Hypertension Mother    Diabetes Mother    Heart failure Mother    Hypertension Father    Diabetes Sister    Kidney failure Brother    Colon cancer Neg Hx    Colon polyps Neg Hx    Esophageal cancer Neg Hx    Rectal cancer Neg Hx    Stomach cancer Neg Hx    Allergies  Allergen Reactions   Metformin And Related Other (See Comments)    The XR caused sore throat, HA, GI issues   Current Outpatient Medications on File Prior to Visit  Medication Sig Dispense Refill   Blood Glucose Monitoring Suppl (ONETOUCH VERIO REFLECT) w/Device KIT Use to check blood sugar 2 times a day 1 kit 0   diclofenac (VOLTAREN) 75 MG EC tablet Take 1 tablet (75 mg total) by mouth 2 (two) times daily. 60 tablet 1   glipiZIDE (GLUCOTROL) 10 MG tablet TAKE 1 TABLET (10 MG TOTAL) DAILY BEFORE BREAKFAST AND 1/2 TABLET (5 MG TOTAL) DAILY BEFORE SUPPER. 135 tablet 1   guaiFENesin-codeine (ROBITUSSIN AC) 100-10 MG/5ML syrup Take 5-10 mLs by mouth at bedtime as needed for cough. 180 mL 0   hydrochlorothiazide (HYDRODIURIL) 25 MG tablet TAKE 1 TABLET BY MOUTH EVERY OTHER DAY 15 tablet 6   Lancets (ONETOUCH ULTRASOFT) lancets Twice daily 100 each 12   naproxen sodium (ALEVE) 220 MG tablet Take 220 mg by mouth 2 (two) times daily as needed.     ONETOUCH VERIO test strip USE AS INSTRUCTED TO CHECK BLOOD SUGAR 2 TIMES DAILY 100 strip 12   rosuvastatin (CRESTOR) 5 MG tablet Take 1 tablet (5 mg total) by mouth as directed. Four  times a week 52 tablet 3   Vitamin D, Ergocalciferol, (DRISDOL) 1.25 MG (50000 UNIT) CAPS capsule TAKE 1 CAPSULE (50,000 UNITS TOTAL) BY MOUTH TWO TIMES A WEEK 8 capsule 12   No current facility-administered medications on file prior to visit.     Review of Systems  Constitutional:  Negative for activity change, appetite change, fatigue, fever and unexpected weight change.  HENT:  Negative for congestion, ear pain, rhinorrhea, sinus pressure and sore throat.   Eyes:  Negative for pain,  redness and visual disturbance.  Respiratory:  Positive for cough. Negative for chest tightness, shortness of breath and wheezing.   Cardiovascular:  Positive for chest pain. Negative for palpitations and leg swelling.  Gastrointestinal:  Negative for abdominal pain, blood in stool, constipation and diarrhea.  Burping   Endocrine: Negative for polydipsia and polyuria.  Genitourinary:  Negative for dysuria, frequency and urgency.  Musculoskeletal:  Negative for arthralgias, back pain and myalgias.  Skin:  Negative for pallor and rash.  Allergic/Immunologic: Negative for environmental allergies.  Neurological:  Negative for dizziness, syncope and headaches.  Hematological:  Negative for adenopathy. Does not bruise/bleed easily.  Psychiatric/Behavioral:  Negative for decreased concentration and dysphoric mood. The patient is not nervous/anxious.        Objective:   Physical Exam Constitutional:      General: She is not in acute distress.    Appearance: She is well-developed. She is obese. She is not ill-appearing or diaphoretic.  HENT:     Head: Normocephalic and atraumatic.  Eyes:     Conjunctiva/sclera: Conjunctivae normal.     Pupils: Pupils are equal, round, and reactive to light.  Neck:     Thyroid: No thyromegaly.     Vascular: No carotid bruit or JVD.  Cardiovascular:     Rate and Rhythm: Regular rhythm. Tachycardia present.     Heart sounds: Normal heart sounds.     No gallop.  Pulmonary:     Effort: Pulmonary effort is normal. No respiratory distress.     Breath sounds: Normal breath sounds. No stridor. No wheezing, rhonchi or rales.  Chest:     Chest wall: No tenderness.  Abdominal:     General: There is no distension or abdominal bruit.     Palpations: Abdomen is soft. There is no mass.     Tenderness: There is no abdominal tenderness. There is no guarding or rebound.     Hernia: No hernia is present.  Genitourinary:    Comments: Breast exam: No mass, nodules,  thickening, tenderness, bulging, retraction, inflamation, nipple discharge or skin changes noted.  No axillary or clavicular LA.     Musculoskeletal:     Cervical back: Normal range of motion and neck supple. No tenderness.     Right lower leg: No edema.     Left lower leg: No edema.     Comments: No pedal edema No leg tenderness or palpable cords   Lymphadenopathy:     Cervical: No cervical adenopathy.  Skin:    General: Skin is warm and dry.     Coloration: Skin is not jaundiced or pale.     Findings: No bruising, erythema or rash.  Neurological:     Mental Status: She is alert.     Cranial Nerves: No cranial nerve deficit.     Coordination: Coordination normal.     Deep Tendon Reflexes: Reflexes are normal and symmetric. Reflexes normal.  Psychiatric:        Mood and Affect: Mood normal.           Assessment & Plan:   Problem List Items Addressed This Visit       Cardiovascular and Mediastinum   Essential hypertension    bp in fair control at this time  BP Readings from Last 1 Encounters:  06/09/22 126/70  No changes needed Most recent labs reviewed  Disc lifstyle change with low sodium diet and exercise  Continues hctz 25 mg every other day        Other   Atypical chest pain    Right /behind breast and sub sternal Positional but not exertional  Pt does have vascular risk factors Reassuring EKG Reassuring cxr (pt had covid in December and still occ coughs  Reassuring labs and vitals incl pulse  ox   Disc poss of GI source- may try pepcid and watch diet and avoid nsaid Disc poss of  gallstones-no abd tenderness today   If not imp consider [place] Consider card eval if not imp  Inst to call if worse/ any sob/ new symptoms  ER precautions noted        Other Visit Diagnoses     Chest pain, unspecified type    -  Primary   Relevant Orders   EKG 12-Lead (Completed)   DG Chest 2 View (Completed)

## 2022-06-09 NOTE — Patient Instructions (Signed)
Take care of yourself   If symptoms suddenly worsen or you become short of breath go to the ER  Let's do a chest xray now  We will call you later today with a result   Avoid the aleve and diclofenac for now   Eat light   Watch for pain in your right abdomen Watch for rash

## 2022-06-09 NOTE — Assessment & Plan Note (Signed)
Right /behind breast and sub sternal Positional but not exertional  Pt does have vascular risk factors Reassuring EKG Reassuring cxr (pt had covid in December and still occ coughs  Reassuring labs and vitals incl pulse ox   Disc poss of GI source- may try pepcid and watch diet and avoid nsaid Disc poss of  gallstones-no abd tenderness today   If not imp consider [place] Consider card eval if not imp  Inst to call if worse/ any sob/ new symptoms  ER precautions noted

## 2022-06-09 NOTE — Assessment & Plan Note (Signed)
bp in fair control at this time  BP Readings from Last 1 Encounters:  06/09/22 126/70   No changes needed Most recent labs reviewed  Disc lifstyle change with low sodium diet and exercise  Continues hctz 25 mg every other day

## 2022-08-20 NOTE — Progress Notes (Signed)
Name: Krista Newton  Age/ Sex: 57 y.o., female   MRN/ DOB: [id number], Oct 08, 1965     PCP: Judy Pimple, MD   Reason for Endocrinology Evaluation: Type 2 Diabetes Mellitus     Initial Endocrinology Clinic Visit: 08/01/2018      PATIENT IDENTIFIER: [name] is a 57 y.o. female with a past medical history of HTN and T2DM. The patient has followed with Endocrinology clinic since 08/01/2018 for consultative assistance with management of her diabetes.  DIABETIC HISTORY:  Ms. Spiers was diagnosed with T2DM in 2015. She reports intolerance to Metformin (Throat chemical burn ) . Her hemoglobin A1c has ranged from 7.3% in 2017, peaking at 10.0% in 2020.  On her initial visit to our clinic her A1c was 10.0 % and was on Glipizide.   Intolerant to Rybelsus 08/2018   I increase her Jardiance from 10 mg to 25 mg 10/2021 but she attributed the sore throat to it and stomach issues SUBJECTIVE:   During the last visit (11/05/2021): A1c 7.0 %     Today (11/05/2021): Ms. Gorelick is here for a   follow up on diabetes management. She checks her blood sugars 2 times daily. The patient has not noted  hypoglycemic episodes since the last clinic visit . Did not bring her meter today    Denies nausea, vomiting or diarrhea  She developed stomach issues and sore throat with Jardiance, her symptoms resolved immediately with discontinuation  HOME ENDOCRINE REGIMEN:  Glipizide 10 mg, 1 tab before breakfast and 1before supper Rosuvastatin 5 mg 4 times a week     GLUCOSE LOG :  Fasting 130-265 Evening 122-214 mg/d L   DIABETIC COMPLICATIONS: Microvascular complications:    Denies: CKD, neuropathy, retinopathy Last eye exam: Completed 05/22/2021     Macrovascular complications:    Denies: CAD, PVD, CVA   HISTORY:  Past Medical History:  Past Medical History:  Diagnosis Date   Allergy    Anemia    with pregnancy    COVID-19 virus infection 04/17/2019   Diabetes mellitus  without complication    Hyperlipidemia    Hypertension    Past Surgical History:  Past Surgical History:  Procedure Laterality Date   CARPAL TUNNEL RELEASE Bilateral 04/24/2014   CESAREAN SECTION     TUBAL LIGATION     Social History:  reports that she has never smoked. She has never used smokeless tobacco. She reports current alcohol use. She reports that she does not use drugs. Family History:  Family History  Problem Relation Age of Onset   Hypertension Mother    Diabetes Mother    Heart failure Mother    Hypertension Father    Diabetes Sister    Kidney failure Brother    Colon cancer Neg Hx    Colon polyps Neg Hx    Esophageal cancer Neg Hx    Rectal cancer Neg Hx    Stomach cancer Neg Hx      HOME MEDICATIONS: Allergies as of 59/09/2022       Reactions   Metformin And Related Other (See Comments)   The XR caused sore throat, HA, GI issues        Medication List        Accurate as of August 21, 2022 10:41 AM. If you have any questions, ask your nurse or doctor.          diclofenac 75 MG EC tablet Commonly known as: VOLTAREN Take 1 tablet (  75 mg total) by mouth 2 (two) times daily.   famotidine 20 MG tablet Commonly known as: PEPCID Take 1 tablet (20 mg total) by mouth 2 (two) times daily.   glipiZIDE 10 MG tablet Commonly known as: GLUCOTROL TAKE 1 TABLET (10 MG TOTAL) DAILY BEFORE BREAKFAST AND 1/2 TABLET (5 MG TOTAL) DAILY BEFORE SUPPER.   guaiFENesin-codeine 100-10 MG/5ML syrup Commonly known as: ROBITUSSIN AC Take 5-10 mLs by mouth at bedtime as needed for cough.   hydrochlorothiazide 25 MG tablet Commonly known as: HYDRODIURIL TAKE 1 TABLET BY MOUTH EVERY OTHER DAY   naproxen sodium 220 MG tablet Commonly known as: ALEVE Take 220 mg by mouth 2 (two) times daily as needed.   onetouch ultrasoft lancets Twice daily   OneTouch Verio Reflect w/Device Kit Use to check blood sugar 2 times a day   OneTouch Verio test strip Generic drug:  glucose blood USE AS INSTRUCTED TO CHECK BLOOD SUGAR 2 TIMES DAILY   rosuvastatin 5 MG tablet Commonly known as: Crestor Take 1 tablet (5 mg total) by mouth as directed. Four  times a week   Vitamin D (Ergocalciferol) 1.25 MG (50000 UNIT) Caps capsule Commonly known as: DRISDOL TAKE 1 CAPSULE (50,000 UNITS TOTAL) BY MOUTH TWO TIMES A WEEK        VITAL SIGNS: BP (!) 140/86   Pulse 86   Ht 5\' 3"  (1.6 m)   Wt 231 lb (104.8 kg)   SpO2 99%   BMI 40.92 kg/m     PHYSICAL EXAM:  General: Pt appears well and is in NAD  Neck: Thyroid: Thyroid size normal.  No goiter or nodules appreciated.  Lungs: Clear with good BS bilat   Heart: RRR  Extremities:   Lower extremities - No pretibial edema.   Neuro: MS is good with appropriate affect, pt is alert and Ox3      DM foot exam: 08/21/2022  The skin of the feet is intact without sores or ulcerations. The pedal pulses are 2+ on right and 2+ on left. The sensation is intact to a screening 5.07, 10 gram monofilament bilaterally    DATA REVIEWED:  Lab Results  Component Value Date   HGBA1C 8.1 (A) 08/21/2022   HGBA1C 7.0 (A) 11/05/2021   HGBA1C 6.8 (A) 04/18/2021    Latest Reference Range & Units 01/05/22 08:49  Chloride 96 - 112 mEq/L 103  CO2 19 - 32 mEq/L 24  Glucose 70 - 99 mg/dL 161 (H)  BUN 6 - 23 mg/dL 16  Creatinine 0.96 - 0.45 mg/dL 4.09  Calcium 8.4 - 81.1 mg/dL 9.1  Alkaline Phosphatase 39 - 117 U/L 69  Albumin 3.5 - 5.2 g/dL 4.1  AST 0 - 37 U/L 14  ALT 0 - 35 U/L 14  Total Protein 6.0 - 8.3 g/dL 6.9  Total Bilirubin 0.2 - 1.2 mg/dL 0.8  GFR >91.47 mL/min 97.38  Total CHOL/HDL Ratio  5  Cholesterol 0 - 200 mg/dL 829 (H)  HDL Cholesterol >39.00 mg/dL 56.21  LDL (calc) 0 - 99 mg/dL 308 (H)  NonHDL  657.84  Triglycerides 0.0 - 149.0 mg/dL 69.6  VLDL 0.0 - 29.5 mg/dL 28.4  (H): Data is abnormally high   ASSESSMENT / PLAN / RECOMMENDATIONS:   1) Type 2 Diabetes Mellitus, Poorly  controlled, Without  complications - Most recent A1c of 8.1%. Goal A1c < 7.0 %.    - A1c has increased  - Intolerant to Metformin - Intolerant to Jardiance  - Intolerant to Rybelsus  -  I offered Mounjaro, discussed GI side effects, would like to think about it - In the meantime , will increase Glipizide as below    MEDICATIONS:  -Increase  glipizide 10 mg, 1 tablet before Breakfast and 2 tablets before Supper    EDUCATION / INSTRUCTIONS: BG monitoring instructions: Patient is instructed to check her blood sugars 2 times a day, fasting and supper. Call [facility] if: BG persistently < 70  I reviewed the Rule of 15 for the treatment of hypoglycemia in detail with the patient. Literature supplied.  2) Diabetic complications:  Eye: Does not have known diabetic retinopathy.  Neuro/ Feet: Does not have known diabetic peripheral neuropathy. Renal: Patient does not have known baseline CKD. She is not on an ACEI/ARB at present.     3) Dyslipidemia :  -Historically she has had above goal LDL -She has been able to tolerate rosuvastatin 4 times a week - She had declined  PCSK-9 inhibitors and Zetia in the past  -Discussed cardiovascular benefits of lowering cholesterol   Medications Continue rosuvastatin 5 mg , four times a week        F/U in 6  months    Signed electronically by: Lyndle Herrlich, MD  [facility]  [facility] [address] [place] [place], [place] [address] Phone: [phone] FAX: [phone]   CC: Tower, Audrie Gallus, MD [address] [place] [address] Phone: [phone]  Fax: [phone]  Return to Endocrinology clinic as below: No future appointments.

## 2022-08-21 ENCOUNTER — Ambulatory Visit (INDEPENDENT_AMBULATORY_CARE_PROVIDER_SITE_OTHER): Payer: 59 | Admitting: Internal Medicine

## 2022-08-21 ENCOUNTER — Encounter: Payer: Self-pay | Admitting: Internal Medicine

## 2022-08-21 VITALS — BP 140/86 | HR 86 | Ht 63.0 in | Wt 231.0 lb

## 2022-08-21 DIAGNOSIS — E785 Hyperlipidemia, unspecified: Secondary | ICD-10-CM

## 2022-08-21 DIAGNOSIS — E119 Type 2 diabetes mellitus without complications: Secondary | ICD-10-CM

## 2022-08-21 DIAGNOSIS — E1165 Type 2 diabetes mellitus with hyperglycemia: Secondary | ICD-10-CM | POA: Diagnosis not present

## 2022-08-21 LAB — POCT GLYCOSYLATED HEMOGLOBIN (HGB A1C): Hemoglobin A1C: 8.1 % — AB (ref 4.0–5.6)

## 2022-08-21 MED ORDER — GLIPIZIDE 10 MG PO TABS: ORAL_TABLET | ORAL | 3 refills | Status: DC

## 2022-08-21 MED ORDER — ROSUVASTATIN CALCIUM 5 MG PO TABS: 5.0000 mg | ORAL_TABLET | ORAL | 3 refills | Status: DC

## 2022-08-21 NOTE — Patient Instructions (Signed)
-   Increase  Glipizide 10 mg, 1 tablet before Breakfast and TWO tablets before Supper - Continue  Crestor 5 mg Four times a week    HOW TO TREAT LOW BLOOD SUGARS (Blood sugar LESS THAN 70 MG/DL) Please follow the RULE OF 15 for the treatment of hypoglycemia treatment (when your (blood sugars are less than 70 mg/dL)   STEP 1: Take 15 grams of carbohydrates when your blood sugar is low, which includes:  3-4 GLUCOSE TABS  OR 3-4 OZ OF JUICE OR REGULAR SODA OR ONE TUBE OF GLUCOSE GEL    STEP 2: RECHECK blood sugar in 15 MINUTES STEP 3: If your blood sugar is still low at the 15 minute recheck --> then, go back to STEP 1 and treat AGAIN with another 15 grams of carbohydrates.

## 2022-09-17 ENCOUNTER — Other Ambulatory Visit: Payer: Self-pay | Admitting: Internal Medicine

## 2022-09-25 ENCOUNTER — Ambulatory Visit: Payer: 59 | Admitting: Internal Medicine

## 2022-11-05 ENCOUNTER — Other Ambulatory Visit: Payer: Self-pay | Admitting: Family Medicine

## 2022-12-20 ENCOUNTER — Other Ambulatory Visit: Payer: Self-pay | Admitting: Internal Medicine

## 2023-01-01 ENCOUNTER — Ambulatory Visit (INDEPENDENT_AMBULATORY_CARE_PROVIDER_SITE_OTHER)
Admission: RE | Admit: 2023-01-01 | Discharge: 2023-01-01 | Disposition: A | Discharge status: Home / Self Care | Payer: 59 | Source: Ambulatory Visit | Attending: Family Medicine | Admitting: Family Medicine

## 2023-01-01 ENCOUNTER — Ambulatory Visit: Payer: 59 | Admitting: Family Medicine

## 2023-01-01 ENCOUNTER — Encounter: Payer: Self-pay | Admitting: Family Medicine

## 2023-01-01 VITALS — BP 126/78 | HR 81 | Temp 97.7°F | Ht 63.0 in | Wt 230.0 lb

## 2023-01-01 DIAGNOSIS — M25561 Pain in right knee: Secondary | ICD-10-CM | POA: Diagnosis not present

## 2023-01-01 MED ORDER — MELOXICAM 15 MG PO TABS: 15.0000 mg | ORAL_TABLET | Freq: Every day | ORAL | 0 refills | Status: DC | PRN

## 2023-01-01 NOTE — Assessment & Plan Note (Signed)
After twisting/pivoting during a stumble (no fall) earlier this week  No visible swelling  Tender posterior / lateral   Instructed to elevate when able Use cold compress 10 minutes at a time  Meloxicam sent to pharmacy (instead of aleve)  Avoid painful positions /activities  Xray of knee   Call back and Er precautions noted in detail today  Update if not starting to improve in a week or if worsening

## 2023-01-01 NOTE — Progress Notes (Unsigned)
Subjective:    Patient ID: Krista Newton, female    DOB: 03-11-1966, 57 y.o.   MRN: [id number]  HPI  Wt Readings from Last 3 Encounters:  01/01/23 230 lb (104.3 kg)  08/21/22 231 lb (104.8 kg)  06/09/22 234 lb 2 oz (106.2 kg)   40.74 kg/m  Vitals:   01/01/23 0922  BP: 126/78  Pulse: 81  Temp: 97.7 F (36.5 C)  SpO2: 95%     Pt presents with right  leg pain /injury   Tuesday she stepped on left leg and it felt unstable so she twisted her right leg  It hurt   Pain in the back of the knee  Radiates to front of the shin No bruising  No swelling   Dull achy pain    Worse when getting up from sitting  Favors the left side with walking/ does limp     Over the counter  Aleve -until ran out of it  Tylenol   DG Knee Complete 4 Views Right  Result Date: 01/01/2023 CLINICAL DATA:  Right lateral/posterior knee pain after twisting injury. EXAM: RIGHT KNEE - COMPLETE 4+ VIEW COMPARISON:  None Available. FINDINGS: There is no acute fracture or dislocation. Knee alignment is normal. The joint spaces are maintained. There is no erosive change. There is a small suprapatellar effusion. The soft tissues are otherwise unremarkable. IMPRESSION: Small suprapatellar effusion.  No acute osseous abnormality. Electronically Signed   By: Lesia Hausen M.D.   On: 01/01/2023 10:59      Patient Active Problem List   Diagnosis Date Noted   Knee pain, right 01/01/2023   Atypical chest pain 06/09/2022   Left knee pain 11/22/2020   Need for hepatitis C screening test 05/15/2019   Encounter for screening for HIV 05/15/2019   Screening-pulmonary TB 05/15/2019   Colon cancer screening 05/15/2019   Vitamin D deficiency 11/22/2018   Numbness and tingling sensation of skin 10/04/2018   Neck pain 08/18/2017   Productive cough 08/18/2017   Morbid obesity (HCC) 07/07/2015   Essential hypertension 07/05/2015   Hyperlipidemia associated with type 2 diabetes mellitus (HCC) 08/31/2014   Type 2  diabetes mellitus with hyperglycemia, without long-term current use of insulin (HCC) 02/15/2014   Leg cramps 02/06/2014   History of shingles 09/18/2013   Carpal tunnel syndrome 05/31/2013   Routine general medical examination at a health care facility 07/04/2012   HEARING LOSS, RIGHT EAR 07/26/2008   ALLERGIC RHINITIS 06/28/2008   Past Medical History:  Diagnosis Date   Allergy    Anemia    with pregnancy    COVID-19 virus infection 04/17/2019   Diabetes mellitus without complication (HCC)    Hyperlipidemia    Hypertension    Past Surgical History:  Procedure Laterality Date   CARPAL TUNNEL RELEASE Bilateral 04/24/2014   CESAREAN SECTION     TUBAL LIGATION     Social History   Tobacco Use   Smoking status: Never   Smokeless tobacco: Never  Vaping Use   Vaping status: Never Used  Substance Use Topics   Alcohol use: Yes    Alcohol/week: 0.0 standard drinks of alcohol    Comment: Rare   Drug use: No   Family History  Problem Relation Age of Onset   Hypertension Mother    Diabetes Mother    Heart failure Mother    Hypertension Father    Diabetes Sister    Kidney failure Brother    Colon cancer Neg Hx  Colon polyps Neg Hx    Esophageal cancer Neg Hx    Rectal cancer Neg Hx    Stomach cancer Neg Hx    Allergies  Allergen Reactions   Metformin And Related Other (See Comments)    The XR caused sore throat, HA, GI issues   Current Outpatient Medications on File Prior to Visit  Medication Sig Dispense Refill   Blood Glucose Monitoring Suppl (ONETOUCH VERIO REFLECT) w/Device KIT Use to check blood sugar 2 times a day 1 kit 0   famotidine (PEPCID) 20 MG tablet Take 1 tablet (20 mg total) by mouth 2 (two) times daily. 60 tablet 1   glipiZIDE (GLUCOTROL) 10 MG tablet Take 1 tablet (10 mg total) by mouth daily before breakfast AND 2 tablets (20 mg total) daily before supper. 270 tablet 3   hydrochlorothiazide (HYDRODIURIL) 25 MG tablet TAKE 1 TABLET BY MOUTH EVERY  OTHER DAY 45 tablet 0   Lancets (ONETOUCH ULTRASOFT) lancets Twice daily 100 each 12   ONETOUCH VERIO test strip USE AS INSTRUCTED TO CHECK BLOOD SUGAR 2 TIMES DAILY 50 strip 25   rosuvastatin (CRESTOR) 5 MG tablet Take 1 tablet (5 mg total) by mouth as directed. Four  times a week 52 tablet 3   Vitamin D, Ergocalciferol, (DRISDOL) 1.25 MG (50000 UNIT) CAPS capsule TAKE 1 CAPSULE (50,000 UNITS TOTAL) BY MOUTH TWO TIMES A WEEK 8 capsule 12   No current facility-administered medications on file prior to visit.    Review of Systems  Musculoskeletal:        Right knee and lower leg pain        Objective:   Physical Exam Constitutional:      General: She is not in acute distress.    Appearance: Normal appearance. She is obese. She is not ill-appearing.  Musculoskeletal:     Comments: Knee right  No swelling or obvious effusion  No warmth to the touch  No crepitus  ROM: pain with flexion past 90 deg Some discomfort in posterior knee with ankle flexion  Ext -pain with full Mcmurray- causes some lateral pain Bounce test -uncomfortable posteriorly  Stability: Anterior drawer-normal  Lachman exam -normal endpoint   Tenderness posterior, some in lateral joint line  Gait favors LLE   No swelling or tenderness of ankle      Skin:    General: Skin is warm and dry.     Findings: No erythema or rash.  Neurological:     Mental Status: She is alert.  Psychiatric:        Mood and Affect: Mood normal.           Assessment & Plan:   Problem List Items Addressed This Visit       Other   Knee pain, right - Primary    After twisting/pivoting during a stumble (no fall) earlier this week  No visible swelling  Tender posterior / lateral   Instructed to elevate when able Use cold compress 10 minutes at a time  Meloxicam sent to pharmacy (instead of aleve)  Avoid painful positions /activities  Xray of knee  notes small suprapatellar effusion  Call back and Er precautions  noted in detail today    Will schedule visit with sport med   Call back and Er precautions noted in detail today  Update if not starting to improve in a week or if worsening        Relevant Orders   DG Knee Complete 4 Views Right (  Completed)

## 2023-01-01 NOTE — Patient Instructions (Signed)
When you can- elevate your leg Use cold compress for 10 minutes at a time   Try a knee compression sleeve when up and about   Xray now  We will contract you wiith resuls   Be careful not to pivot/twist on this leg

## 2023-01-13 ENCOUNTER — Ambulatory Visit (INDEPENDENT_AMBULATORY_CARE_PROVIDER_SITE_OTHER): Payer: 59 | Admitting: Internal Medicine

## 2023-01-13 ENCOUNTER — Encounter: Payer: Self-pay | Admitting: Internal Medicine

## 2023-01-13 ENCOUNTER — Encounter: Payer: Self-pay | Admitting: Family Medicine

## 2023-01-13 VITALS — BP 132/76 | HR 80 | Ht 63.0 in | Wt 233.0 lb

## 2023-01-13 DIAGNOSIS — Z7984 Long term (current) use of oral hypoglycemic drugs: Secondary | ICD-10-CM | POA: Diagnosis not present

## 2023-01-13 DIAGNOSIS — E1165 Type 2 diabetes mellitus with hyperglycemia: Secondary | ICD-10-CM | POA: Diagnosis not present

## 2023-01-13 DIAGNOSIS — E785 Hyperlipidemia, unspecified: Secondary | ICD-10-CM | POA: Diagnosis not present

## 2023-01-13 LAB — COMPREHENSIVE METABOLIC PANEL
ALT: 16 U/L (ref 0–35)
AST: 15 U/L (ref 0–37)
Albumin: 3.9 g/dL (ref 3.5–5.2)
Alkaline Phosphatase: 66 U/L (ref 39–117)
BUN: 18 mg/dL (ref 6–23)
CO2: 24 mEq/L (ref 19–32)
Calcium: 9.3 mg/dL (ref 8.4–10.5)
Chloride: 105 mEq/L (ref 96–112)
Creatinine, Ser: 0.59 mg/dL (ref 0.40–1.20)
GFR: 98.97 mL/min (ref >60.00)
Glucose, Bld: 213 mg/dL — ABNORMAL HIGH (ref 70–99)
Potassium: 4 mEq/L (ref 3.5–5.1)
Sodium: 138 mEq/L (ref 135–145)
Total Bilirubin: 0.6 mg/dL (ref 0.2–1.2)
Total Protein: 6.9 g/dL (ref 6.0–8.3)

## 2023-01-13 LAB — LIPID PANEL
Cholesterol: 206 mg/dL — ABNORMAL HIGH (ref 0–200)
HDL: 55.5 mg/dL (ref >39.00)
LDL Cholesterol: 125 mg/dL — ABNORMAL HIGH (ref 0–99)
NonHDL: 150.1
Total CHOL/HDL Ratio: 4
Triglycerides: 126 mg/dL (ref 0.0–149.0)
VLDL: 25.2 mg/dL (ref 0.0–40.0)

## 2023-01-13 LAB — VITAMIN D 25 HYDROXY (VIT D DEFICIENCY, FRACTURES): VITD: 57.58 ng/mL (ref 30.00–100.00)

## 2023-01-13 LAB — POCT GLUCOSE (DEVICE FOR HOME USE): POC Glucose: 217 mg/dl — AB (ref 70–99)

## 2023-01-13 LAB — TSH: TSH: 1.42 u[IU]/mL (ref 0.35–5.50)

## 2023-01-13 LAB — MICROALBUMIN / CREATININE URINE RATIO
Creatinine,U: 34.9 mg/dL
Microalb Creat Ratio: 2 mg/g (ref 0.0–30.0)
Microalb, Ur: <0.7 mg/dL (ref 0.0–1.9)

## 2023-01-13 LAB — POCT GLYCOSYLATED HEMOGLOBIN (HGB A1C): Hemoglobin A1C: 7.8 % — AB (ref 4.0–5.6)

## 2023-01-13 MED ORDER — TIRZEPATIDE 5 MG/0.5ML ~~LOC~~ SOAJ: 5.0000 mg | SUBCUTANEOUS | 3 refills | Status: DC

## 2023-01-13 MED ORDER — GLIPIZIDE 10 MG PO TABS: 10.0000 mg | ORAL_TABLET | Freq: Two times a day (BID) | ORAL | 3 refills | Status: DC

## 2023-01-13 NOTE — Telephone Encounter (Signed)
Pt is due for CPE/ labs prior, please schedule

## 2023-01-13 NOTE — Patient Instructions (Signed)
-   Start Mounajro 2.5 mg once weekly for a month, than pick up prescription 5 mg once weekly  - Decrease Glipizide 10 mg, 1 tablet before Breakfast and one tablet before Supper  If your sugar go below 100 mg, please decrease Glipizide to half a tablet before breakfast and Supper      HOW TO TREAT LOW BLOOD SUGARS (Blood sugar LESS THAN 70 MG/DL) Please follow the RULE OF 15 for the treatment of hypoglycemia treatment (when your (blood sugars are less than 70 mg/dL)   STEP 1: Take 15 grams of carbohydrates when your blood sugar is low, which includes:  3-4 GLUCOSE TABS  OR 3-4 OZ OF JUICE OR REGULAR SODA OR ONE TUBE OF GLUCOSE GEL    STEP 2: RECHECK blood sugar in 15 MINUTES STEP 3: If your blood sugar is still low at the 15 minute recheck --> then, go back to STEP 1 and treat AGAIN with another 15 grams of carbohydrates.

## 2023-01-13 NOTE — Telephone Encounter (Signed)
Spoke to pt, scheduled cpe for 9/23

## 2023-01-13 NOTE — Progress Notes (Signed)
Name: Krista Newton  Age/ Sex: 57 y.o., female   MRN/ DOB: [id number], 09/26/1965     PCP: Krista Pimple, MD   Reason for Endocrinology Evaluation: Type 2 Diabetes Mellitus     Initial Endocrinology Clinic Visit: 08/01/2018      PATIENT IDENTIFIER: [name] is a 57 y.o. female with a past medical history of HTN and T2DM. The patient has followed with Endocrinology clinic since 08/01/2018 for consultative assistance with management of her diabetes.  DIABETIC HISTORY:  Krista Newton was diagnosed with T2DM in 2015. She reports intolerance to Metformin (Throat chemical burn ) . Her hemoglobin A1c has ranged from 7.3% in 2017, peaking at 10.0% in 2020.  On her initial visit to our clinic her A1c was 10.0 % and was on Glipizide.   Intolerant to Rybelsus 08/2018   I increase her Jardiance from 10 mg to 25 mg 10/2021 but she attributed the sore throat to it and stomach issues   SUBJECTIVE:   During the last visit (08/21/2022): A1c 8.1 %     Today (11/05/2021): Krista Newton is here for a   follow up on diabetes management. She checks her blood sugars 2 times daily.    Denies nausea, vomiting Denies constipation or diarrhea  She has arthralgias all over that she thinks its because of statin   She developed stomach issues and sore throat with Jardiance, her symptoms resolved immediately with discontinuation  HOME ENDOCRINE REGIMEN:  Glipizide 10 mg, 1 tab before breakfast and 2 tabs before supper Rosuvastatin 5 mg 4 times a week     GLUCOSE LOG : n/a  DIABETIC COMPLICATIONS: Microvascular complications:    Denies: CKD, neuropathy, retinopathy Last eye exam: Completed 01/2022     Macrovascular complications:    Denies: CAD, PVD, CVA   HISTORY:  Past Medical History:  Past Medical History:  Diagnosis Date   Allergy    Anemia    with pregnancy    COVID-19 virus infection 04/17/2019   Diabetes mellitus without complication (HCC)    Hyperlipidemia     Hypertension    Past Surgical History:  Past Surgical History:  Procedure Laterality Date   CARPAL TUNNEL RELEASE Bilateral 04/24/2014   CESAREAN SECTION     TUBAL LIGATION     Social History:  reports that she has never smoked. She has never used smokeless tobacco. She reports current alcohol use. She reports that she does not use drugs. Family History:  Family History  Problem Relation Age of Onset   Hypertension Mother    Diabetes Mother    Heart failure Mother    Hypertension Father    Diabetes Sister    Kidney failure Brother    Colon cancer Neg Hx    Colon polyps Neg Hx    Esophageal cancer Neg Hx    Rectal cancer Neg Hx    Stomach cancer Neg Hx      HOME MEDICATIONS: Allergies as of 01/13/2023       Reactions   Metformin And Related Other (See Comments)   The XR caused sore throat, HA, GI issues        Medication List        Accurate as of January 13, 2023 11:59 PM. If you have any questions, ask your nurse or doctor.          famotidine 20 MG tablet Commonly known as: PEPCID Take 1 tablet (20 mg total) by mouth 2 (two)  times daily.   glipiZIDE 10 MG tablet Commonly known as: GLUCOTROL Take 1 tablet (10 mg total) by mouth 2 (two) times daily before a meal. What changed: See the new instructions. Changed by: Johnney Ou Shiara Mcgough   hydrochlorothiazide 25 MG tablet Commonly known as: HYDRODIURIL TAKE 1 TABLET BY MOUTH EVERY OTHER DAY   meloxicam 15 MG tablet Commonly known as: MOBIC Take 1 tablet (15 mg total) by mouth daily as needed for pain. With food   onetouch ultrasoft lancets Twice daily   OneTouch Verio Reflect w/Device Kit Use to check blood sugar 2 times a day   OneTouch Verio test strip Generic drug: glucose blood USE AS INSTRUCTED TO CHECK BLOOD SUGAR 2 TIMES DAILY   rosuvastatin 5 MG tablet Commonly known as: Crestor Take 1 tablet (5 mg total) by mouth as directed. Four  times a week   tirzepatide 5 MG/0.5ML Pen Commonly  known as: MOUNJARO Inject 5 mg into the skin once a week. Started by: Johnney Ou Saban Heinlen   Vitamin D (Ergocalciferol) 1.25 MG (50000 UNIT) Caps capsule Commonly known as: DRISDOL TAKE 1 CAPSULE (50,000 UNITS TOTAL) BY MOUTH TWO TIMES A WEEK        VITAL SIGNS: BP 132/76 (BP Location: Left Arm, Patient Position: Sitting, Cuff Size: Large)   Pulse 80   Ht 5\' 3"  (1.6 m)   Wt 233 lb (105.7 kg)   SpO2 96%   BMI 41.27 kg/m     PHYSICAL EXAM:  General: Pt appears well and is in NAD  Neck: Thyroid: Thyroid size normal.  No goiter or nodules appreciated.  Lungs: Clear with good BS bilat   Heart: RRR  Extremities:   Lower extremities - No pretibial edema.   Neuro: MS is good with appropriate affect, pt is alert and Ox3      DM foot exam: 08/21/2022  The skin of the feet is intact without sores or ulcerations. The pedal pulses are 2+ on right and 2+ on left. The sensation is intact to a screening 5.07, 10 gram monofilament bilaterally    DATA REVIEWED:  Lab Results  Component Value Date   HGBA1C 7.8 (A) 01/13/2023   HGBA1C 8.1 (A) 08/21/2022   HGBA1C 7.0 (A) 11/05/2021    Latest Reference Range & Units 01/13/23 11:11  Sodium 135 - 145 mEq/L 138  Potassium 3.5 - 5.1 mEq/L 4.0  Chloride 96 - 112 mEq/L 105  CO2 19 - 32 mEq/L 24  Glucose 70 - 99 mg/dL 161 (H)  BUN 6 - 23 mg/dL 18  Creatinine 0.96 - 0.45 mg/dL 4.09  Calcium 8.4 - 81.1 mg/dL 9.3  Alkaline Phosphatase 39 - 117 U/L 66  Albumin 3.5 - 5.2 g/dL 3.9  AST 0 - 37 U/L 15  ALT 0 - 35 U/L 16  Total Protein 6.0 - 8.3 g/dL 6.9  Total Bilirubin 0.2 - 1.2 mg/dL 0.6  GFR >91.47 mL/min 98.97  Total CHOL/HDL Ratio  4  Cholesterol 0 - 200 mg/dL 829 (H)  HDL Cholesterol >39.00 mg/dL 56.21  LDL (calc) 0 - 99 mg/dL 308 (H)  MICROALB/CREAT RATIO 0.0 - 30.0 mg/g 2.0  NonHDL  150.10  Triglycerides 0.0 - 149.0 mg/dL 657.8  VLDL 0.0 - 46.9 mg/dL 62.9  VITD 52.84 - 132.44 ng/mL 57.58     Latest Reference Range & Units  01/13/23 11:11  TSH 0.35 - 5.50 uIU/mL 1.42    Latest Reference Range & Units 01/13/23 11:11  Creatinine,U mg/dL 01.0  Microalb, Ur  0.0 - 1.9 mg/dL <8.4  MICROALB/CREAT RATIO 0.0 - 30.0 mg/g 2.0      ASSESSMENT / PLAN / RECOMMENDATIONS:   1) Type 2 Diabetes Mellitus, Sub-Optimally  controlled, Without complications - Most recent A1c of 7.8%. Goal A1c < 7.0 %.    -A1c continues to be above goal - Intolerant to Metformin - Intolerant to [employer]  - Intolerant to Rybelsus  -I have again recommended Mounjaro, caution against GI side effects, will decrease glipizide as below, she was also advised to decrease the dose of glipizide to half a tablet twice daily with BG's <100 mg/dL    MEDICATIONS: -Start Mounjaro 2.5 mg once weekly for a month, then increase to 5 mg once weekly - Decrease  glipizide 10 mg, 1 tablet before Breakfast and 1 tablet before Supper    EDUCATION / INSTRUCTIONS: BG monitoring instructions: Patient is instructed to check her blood sugars 2 times a day, fasting and supper. Call [place] [facility] if: BG persistently < 70  I reviewed the Rule of 15 for the treatment of hypoglycemia in detail with the patient. Literature supplied.  2) Diabetic complications:  Eye: Does not have known diabetic retinopathy.  Neuro/ Feet: Does not have known diabetic peripheral neuropathy. Renal: Patient does not have known baseline CKD. She is not on an ACEI/ARB at present.     3) Dyslipidemia :   -She does have generalized aches and pains that she attributes to statin therapy - She had declined  PCSK-9 inhibitors and Zetia in the past  -Discussed cardiovascular benefits of lowering cholesterol -LDL trending down but continues to be above goal, will encourage low-fat diet   Medications Continue rosuvastatin 5 mg , four times a week        F/U in 6  months    Signed electronically by: Lyndle Herrlich, MD  [facility]  [facility] [address] [place] [place], [place] [address] Phone: [phone] FAX: [phone]   CC: Tower, Audrie Gallus, MD [address] [place] [address] Phone: [phone]  Fax: [phone]  Return to Endocrinology clinic as below: Future Appointments  Date Time Provider Department Center  01/22/2023  9:00 AM [facility]  02/08/2023 11:00 AM Tower, Audrie Gallus, MD [facility]  05/13/2023  9:50 AM [facility] Grandison, Konrad Dolores, MD [facility] None

## 2023-01-22 ENCOUNTER — Other Ambulatory Visit: Payer: 59

## 2023-02-08 ENCOUNTER — Encounter: Payer: 59 | Admitting: Family Medicine

## 2023-02-08 ENCOUNTER — Telehealth: Payer: Self-pay

## 2023-02-08 NOTE — Telephone Encounter (Signed)
Received Access nurse note that pt needed to cancel appointment 02/08/23 due to husband having surgery.  Cancelled appointment as requested.  Sent mychart message to reschedule when she is able.

## 2023-02-15 LAB — HM DIABETES EYE EXAM

## 2023-03-03 ENCOUNTER — Encounter: Payer: Self-pay | Admitting: Internal Medicine

## 2023-04-20 ENCOUNTER — Other Ambulatory Visit: Payer: Self-pay | Admitting: Family Medicine

## 2023-04-20 NOTE — Telephone Encounter (Signed)
Yes, refill 30 days then re schedule  Thanks

## 2023-04-20 NOTE — Telephone Encounter (Signed)
Med refill for 30 days. Please schedule CPE (labs prior)

## 2023-04-20 NOTE — Telephone Encounter (Signed)
Looks like patient never rescheduled CPE in September. Ok to refill for 30 days and ask to make appointment?

## 2023-04-21 NOTE — Telephone Encounter (Signed)
Patient has been scheduled. She stated that she has been busy with her husband since his surgery with his appointments. She stated that she has enough of the medication to last until January.

## 2023-05-05 NOTE — Progress Notes (Signed)
Name: Krista Newton  Age/ Sex: 57 y.o., female   MRN/ DOB: [id number], 1966-01-15     PCP: Judy Pimple, MD   Reason for Endocrinology Evaluation: Type 2 Diabetes Mellitus     Initial Endocrinology Clinic Visit: 08/01/2018      PATIENT IDENTIFIER: [name] is a 57 y.o. female with a past medical history of HTN and T2DM. The patient has followed with Endocrinology clinic since 08/01/2018 for consultative assistance with management of her diabetes.  DIABETIC HISTORY:  Krista Newton was diagnosed with T2DM in 2015. She reports intolerance to Metformin (Throat chemical burn ) . Her hemoglobin A1c has ranged from 7.3% in 2017, peaking at 10.0% in 2020.  On her initial visit to our clinic her A1c was 10.0 % and was on Glipizide.   Intolerant to Rybelsus 08/2018   I increase her Jardiance from 10 mg to 25 mg 10/2021 but she attributed the sore throat to it and stomach issues  Started mounjaro 12/2022   SUBJECTIVE:   During the last visit (01/13/2023): A1c 7.8%     Today (11/05/2021): Krista Newton is here for a   follow up on diabetes management. She checks her blood sugars 2 times daily.  The patient has been noted with hypoglycemic episodes in the morning, patient is symptomatic with these episodes.  Patient has been noted for weight loss Denies nausea, vomiting Denies constipation or diarrhea     HOME ENDOCRINE REGIMEN:  Glipizide 10 mg, half tab before breakfast and half tabs before supper Mounjaro 5 mg weekly Rosuvastatin 5 mg 4 times a week    GLUCOSE LOG :  63-156 mg/dL     DIABETIC COMPLICATIONS: Microvascular complications:    Denies: CKD, neuropathy, retinopathy Last eye exam: Completed 01/2022     Macrovascular complications:    Denies: CAD, PVD, CVA   HISTORY:  Past Medical History:  Past Medical History:  Diagnosis Date   Allergy    Anemia    with pregnancy    COVID-19 virus infection 04/17/2019   Diabetes mellitus without  complication (HCC)    Hyperlipidemia    Hypertension    Past Surgical History:  Past Surgical History:  Procedure Laterality Date   CARPAL TUNNEL RELEASE Bilateral 04/24/2014   CESAREAN SECTION     TUBAL LIGATION     Social History:  reports that she has never smoked. She has never used smokeless tobacco. She reports current alcohol use. She reports that she does not use drugs. Family History:  Family History  Problem Relation Age of Onset   Hypertension Mother    Diabetes Mother    Heart failure Mother    Hypertension Father    Diabetes Sister    Kidney failure Brother    Colon cancer Neg Hx    Colon polyps Neg Hx    Esophageal cancer Neg Hx    Rectal cancer Neg Hx    Stomach cancer Neg Hx      HOME MEDICATIONS: Allergies as of 05/13/2023       Reactions   Metformin And Related Other (See Comments)   The XR caused sore throat, HA, GI issues        Medication List        Accurate as of May 13, 2023 10:00 AM. If you have any questions, ask your nurse or doctor.          famotidine 20 MG tablet Commonly known as: PEPCID Take 1 tablet (  20 mg total) by mouth 2 (two) times daily.   glipiZIDE 10 MG tablet Commonly known as: GLUCOTROL Take 1 tablet (10 mg total) by mouth 2 (two) times daily before a meal.   hydrochlorothiazide 25 MG tablet Commonly known as: HYDRODIURIL TAKE 1 TABLET BY MOUTH EVERY OTHER DAY   meloxicam 15 MG tablet Commonly known as: MOBIC Take 1 tablet (15 mg total) by mouth daily as needed for pain. With food   onetouch ultrasoft lancets Twice daily   OneTouch Verio Reflect w/Device Kit Use to check blood sugar 2 times a day   OneTouch Verio test strip Generic drug: glucose blood USE AS INSTRUCTED TO CHECK BLOOD SUGAR 2 TIMES DAILY   rosuvastatin 5 MG tablet Commonly known as: Crestor Take 1 tablet (5 mg total) by mouth as directed. Four  times a week   tirzepatide 5 MG/0.5ML Pen Commonly known as: MOUNJARO Inject 5 mg  into the skin once a week.   Vitamin D (Ergocalciferol) 1.25 MG (50000 UNIT) Caps capsule Commonly known as: DRISDOL TAKE 1 CAPSULE (50,000 UNITS TOTAL) BY MOUTH TWO TIMES A WEEK        VITAL SIGNS: BP 122/84   Pulse 87   Resp 14   Ht 5\' 3"  (1.6 m)   Wt 214 lb 9.6 oz (97.3 kg)   SpO2 99%   BMI 38.01 kg/m     PHYSICAL EXAM:  General: Pt appears well and is in NAD  Lungs: Clear with good BS bilat   Heart: RRR  Extremities:   Lower extremities - No pretibial edema.   Neuro: MS is good with appropriate affect, pt is alert and Ox3      DM foot exam: 05/13/2023  The skin of the feet is intact without sores or ulcerations. The pedal pulses are 2+ on right and 2+ on left. The sensation is intact to a screening 5.07, 10 gram monofilament bilaterally    DATA REVIEWED:  Lab Results  Component Value Date   HGBA1C 7.8 (A) 01/13/2023   HGBA1C 8.1 (A) 08/21/2022   HGBA1C 7.0 (A) 11/05/2021    Latest Reference Range & Units 01/13/23 11:11  Sodium 135 - 145 mEq/L 138  Potassium 3.5 - 5.1 mEq/L 4.0  Chloride 96 - 112 mEq/L 105  CO2 19 - 32 mEq/L 24  Glucose 70 - 99 mg/dL 161 (H)  BUN 6 - 23 mg/dL 18  Creatinine 0.96 - 0.45 mg/dL 4.09  Calcium 8.4 - 81.1 mg/dL 9.3  Alkaline Phosphatase 39 - 117 U/L 66  Albumin 3.5 - 5.2 g/dL 3.9  AST 0 - 37 U/L 15  ALT 0 - 35 U/L 16  Total Protein 6.0 - 8.3 g/dL 6.9  Total Bilirubin 0.2 - 1.2 mg/dL 0.6  GFR >91.47 mL/min 98.97  Total CHOL/HDL Ratio  4  Cholesterol 0 - 200 mg/dL 829 (H)  HDL Cholesterol >39.00 mg/dL 56.21  LDL (calc) 0 - 99 mg/dL 308 (H)  MICROALB/CREAT RATIO 0.0 - 30.0 mg/g 2.0  NonHDL  150.10  Triglycerides 0.0 - 149.0 mg/dL 657.8  VLDL 0.0 - 46.9 mg/dL 62.9  VITD 52.84 - 132.44 ng/mL 57.58     Latest Reference Range & Units 01/13/23 11:11  TSH 0.35 - 5.50 uIU/mL 1.42    Latest Reference Range & Units 01/13/23 11:11  Creatinine,U mg/dL 01.0  Microalb, Ur 0.0 - 1.9 mg/dL <2.7  MICROALB/CREAT RATIO 0.0 - 30.0  mg/g 2.0      ASSESSMENT / PLAN / RECOMMENDATIONS:  1) Type 2 Diabetes Mellitus, Optimally  controlled, Without complications - Most recent A1c of 6.1%. Goal A1c < 7.0 %.    -A1c is optimal -Patient has been noted with hypoglycemia, will discontinue glipizide -We opted to remain on current dose of Mounjaro - Intolerant to Metformin - Intolerant to [employer]  - Intolerant to Rybelsus     MEDICATIONS: -Continue Mounjaro 5 mg weekly -Stop Glipizide     EDUCATION / INSTRUCTIONS: BG monitoring instructions: Patient is instructed to check her blood sugars 2 times a day, fasting and supper. Call [facility] if: BG persistently < 70  I reviewed the Rule of 15 for the treatment of hypoglycemia in detail with the patient. Literature supplied.  2) Diabetic complications:  Eye: Does not have known diabetic retinopathy.  Neuro/ Feet: Does not have known diabetic peripheral neuropathy. Renal: Patient does not have known baseline CKD. She is not on an ACEI/ARB at present.     3) Dyslipidemia :   -She does have generalized aches and pains that she attributes to statin therapy - She had declined  PCSK-9 inhibitors and Zetia in the past  -LDL trending down -Patient to take statin therapy at night, I have emphasized the importance of taking this same days out of the week   Medications Continue rosuvastatin 5 mg , four times a week    F/U in 6  months    Signed electronically by: Lyndle Herrlich, MD  [facility]  [facility] [address] [place] [place], [place] [address] Phone: [phone] FAX: [phone]   CC: Tower, Audrie Gallus, MD [address] [place] [address] Phone: [phone]  Fax: [phone]  Return to Endocrinology clinic as below: Future Appointments  Date Time Provider Department Center  05/21/2023 10:30 AM Tower, Audrie Gallus, MD [facility]

## 2023-05-06 ENCOUNTER — Encounter: Payer: Self-pay | Admitting: Family Medicine

## 2023-05-06 NOTE — Telephone Encounter (Signed)
 Care team updated and letter sent for eye exam notes.

## 2023-05-13 ENCOUNTER — Ambulatory Visit (INDEPENDENT_AMBULATORY_CARE_PROVIDER_SITE_OTHER): Payer: 59 | Admitting: Internal Medicine

## 2023-05-13 ENCOUNTER — Encounter: Payer: Self-pay | Admitting: Internal Medicine

## 2023-05-13 ENCOUNTER — Other Ambulatory Visit: Payer: Self-pay | Admitting: Internal Medicine

## 2023-05-13 VITALS — BP 122/84 | HR 87 | Resp 14 | Ht 63.0 in | Wt 214.6 lb

## 2023-05-13 DIAGNOSIS — Z7985 Long-term (current) use of injectable non-insulin antidiabetic drugs: Secondary | ICD-10-CM

## 2023-05-13 DIAGNOSIS — E559 Vitamin D deficiency, unspecified: Secondary | ICD-10-CM

## 2023-05-13 DIAGNOSIS — E785 Hyperlipidemia, unspecified: Secondary | ICD-10-CM

## 2023-05-13 DIAGNOSIS — E119 Type 2 diabetes mellitus without complications: Secondary | ICD-10-CM | POA: Diagnosis not present

## 2023-05-13 LAB — POCT GLYCOSYLATED HEMOGLOBIN (HGB A1C): Hemoglobin A1C: 6.1 % — AB (ref 4.0–5.6)

## 2023-05-13 MED ORDER — ROSUVASTATIN CALCIUM 5 MG PO TABS: 5.0000 mg | ORAL_TABLET | ORAL | 3 refills | Status: DC

## 2023-05-13 MED ORDER — TIRZEPATIDE 5 MG/0.5ML ~~LOC~~ SOAJ: 5.0000 mg | SUBCUTANEOUS | 3 refills | Status: DC

## 2023-05-13 NOTE — Patient Instructions (Addendum)
-   Continue Mounajro 5 mg once weekly  - Stop Glipizide      HOW TO TREAT LOW BLOOD SUGARS (Blood sugar LESS THAN 70 MG/DL) Please follow the RULE OF 15 for the treatment of hypoglycemia treatment (when your (blood sugars are less than 70 mg/dL)   STEP 1: Take 15 grams of carbohydrates when your blood sugar is low, which includes:  3-4 GLUCOSE TABS  OR 3-4 OZ OF JUICE OR REGULAR SODA OR ONE TUBE OF GLUCOSE GEL    STEP 2: RECHECK blood sugar in 15 MINUTES STEP 3: If your blood sugar is still low at the 15 minute recheck --> then, go back to STEP 1 and treat AGAIN with another 15 grams of carbohydrates.

## 2023-05-18 ENCOUNTER — Other Ambulatory Visit: Payer: Self-pay | Admitting: Family Medicine

## 2023-05-21 ENCOUNTER — Ambulatory Visit (INDEPENDENT_AMBULATORY_CARE_PROVIDER_SITE_OTHER): Payer: 59 | Admitting: Family Medicine

## 2023-05-21 VITALS — BP 124/72 | HR 78 | Temp 98.4°F | Ht 63.5 in | Wt 212.5 lb

## 2023-05-21 DIAGNOSIS — E559 Vitamin D deficiency, unspecified: Secondary | ICD-10-CM | POA: Diagnosis not present

## 2023-05-21 DIAGNOSIS — Z1231 Encounter for screening mammogram for malignant neoplasm of breast: Secondary | ICD-10-CM | POA: Insufficient documentation

## 2023-05-21 DIAGNOSIS — I10 Essential (primary) hypertension: Secondary | ICD-10-CM | POA: Diagnosis not present

## 2023-05-21 DIAGNOSIS — E1165 Type 2 diabetes mellitus with hyperglycemia: Secondary | ICD-10-CM

## 2023-05-21 DIAGNOSIS — E1169 Type 2 diabetes mellitus with other specified complication: Secondary | ICD-10-CM | POA: Diagnosis not present

## 2023-05-21 DIAGNOSIS — E119 Type 2 diabetes mellitus without complications: Secondary | ICD-10-CM | POA: Insufficient documentation

## 2023-05-21 DIAGNOSIS — Z0000 Encounter for general adult medical examination without abnormal findings: Secondary | ICD-10-CM | POA: Diagnosis not present

## 2023-05-21 DIAGNOSIS — E785 Hyperlipidemia, unspecified: Secondary | ICD-10-CM

## 2023-05-21 DIAGNOSIS — Z7985 Long-term (current) use of injectable non-insulin antidiabetic drugs: Secondary | ICD-10-CM

## 2023-05-21 LAB — LIPID PANEL
Cholesterol: 243 mg/dL — ABNORMAL HIGH (ref 0–200)
HDL: 63.4 mg/dL (ref >39.00)
LDL Cholesterol: 148 mg/dL — ABNORMAL HIGH (ref 0–99)
NonHDL: 179.47
Total CHOL/HDL Ratio: 4
Triglycerides: 158 mg/dL — ABNORMAL HIGH (ref 0.0–149.0)
VLDL: 31.6 mg/dL (ref 0.0–40.0)

## 2023-05-21 LAB — COMPREHENSIVE METABOLIC PANEL
ALT: 14 U/L (ref 0–35)
AST: 14 U/L (ref 0–37)
Albumin: 4.6 g/dL (ref 3.5–5.2)
Alkaline Phosphatase: 70 U/L (ref 39–117)
BUN: 14 mg/dL (ref 6–23)
CO2: 25 meq/L (ref 19–32)
Calcium: 9.8 mg/dL (ref 8.4–10.5)
Chloride: 102 meq/L (ref 96–112)
Creatinine, Ser: 0.76 mg/dL (ref 0.40–1.20)
GFR: 85.84 mL/min (ref >60.00)
Glucose, Bld: 120 mg/dL — ABNORMAL HIGH (ref 70–99)
Potassium: 4 meq/L (ref 3.5–5.1)
Sodium: 138 meq/L (ref 135–145)
Total Bilirubin: 0.5 mg/dL (ref 0.2–1.2)
Total Protein: 7.7 g/dL (ref 6.0–8.3)

## 2023-05-21 LAB — CBC WITH DIFFERENTIAL/PLATELET
Basophils Absolute: 0 10*3/uL (ref 0.0–0.1)
Basophils Relative: 0.8 % (ref 0.0–3.0)
Eosinophils Absolute: 0.2 10*3/uL (ref 0.0–0.7)
Eosinophils Relative: 2.6 % (ref 0.0–5.0)
HCT: 42.1 % (ref 36.0–46.0)
Hemoglobin: 14.2 g/dL (ref 12.0–15.0)
Lymphocytes Relative: 30.3 % (ref 12.0–46.0)
Lymphs Abs: 2 10*3/uL (ref 0.7–4.0)
MCHC: 33.8 g/dL (ref 30.0–36.0)
MCV: 92.5 fL (ref 78.0–100.0)
Monocytes Absolute: 0.4 10*3/uL (ref 0.1–1.0)
Monocytes Relative: 5.9 % (ref 3.0–12.0)
Neutro Abs: 3.9 10*3/uL (ref 1.4–7.7)
Neutrophils Relative %: 60.4 % (ref 43.0–77.0)
Platelets: 277 10*3/uL (ref 150.0–400.0)
RBC: 4.55 Mil/uL (ref 3.87–5.11)
RDW: 13.2 % (ref 11.5–15.5)
WBC: 6.4 10*3/uL (ref 4.0–10.5)

## 2023-05-21 LAB — VITAMIN D 25 HYDROXY (VIT D DEFICIENCY, FRACTURES): VITD: 48.61 ng/mL (ref 30.00–100.00)

## 2023-05-21 LAB — TSH: TSH: 1.85 u[IU]/mL (ref 0.35–5.50)

## 2023-05-21 NOTE — Assessment & Plan Note (Signed)
 Sees endocrinology  Lab Results  Component Value Date   HGBA1C 6.1 (A) 05/13/2023   Sent for eye exam Microalb utd  On mounjaro 5 mg weekly / tolerating and losing weight  Encouraged strength training to prevent loss of muscle  Normal foot exam

## 2023-05-21 NOTE — Patient Instructions (Addendum)
 Add some strength training to your routine, this is important for bone and brain health and can reduce your risk of falls and help your body use insulin properly and regulate weight  Light weights, exercise bands , and internet videos are a good way to start  Yoga (chair or regular), machines , floor exercises or a gym with machines are also good options   Keep up the good work with diet   Labs today    You are due for a mammogram   You have an order for:  []   2D Mammogram  [x]   3D Mammogram  []   Bone Density     Please call for appointment:   []   [facility]  [address] [place] [address]  [phone]  []   [facility])   [address]  [place], [place] [address]  [phone]  [x]   [facility]      [address] [place], [place]        [phone]         []   [facility]  [address] [place], [place]  [phone]  []  [facility]   [address]   [place], [place] [address]  [phone]  []  [facility]  [address] [place], [place] [address] [phone]    Make sure to wear two piece clothing  No lotions powders or deodorants the day of the appointment Make sure to bring picture ID and insurance card.  Bring list of medications you are currently taking including any supplements.   Schedule your screening mammogram through MyChart!   Select [place] imaging sites can now be scheduled through MyChart.  Log into your MyChart account.  Go to 'Visit' (or 'Appointments' if  on mobile App) --> Schedule an  Appointment  Under 'Select a Reason for Visit' choose the Mammogram  Screening option.  Complete the pre-visit questions  and select the time and place that  best fits your schedule

## 2023-05-21 NOTE — Assessment & Plan Note (Signed)
 D level today   Vitamin D level is therapeutic with current supplementation Disc importance of this to bone and overall health

## 2023-05-21 NOTE — Assessment & Plan Note (Signed)
 Mammogram ordered Pt will call to schedule  Sees gyn for exams

## 2023-05-21 NOTE — Progress Notes (Signed)
 Subjective:    Patient ID: Krista Newton, female    DOB: 1966-05-13, 58 y.o.   MRN: [id number]  HPI  Here for health maintenance exam and to review chronic medical problems   Wt Readings from Last 3 Encounters:  05/21/23 212 lb 8 oz (96.4 kg)  05/13/23 214 lb 9.6 oz (97.3 kg)  01/13/23 233 lb (105.7 kg)   37.05 kg/m  Vitals:   05/21/23 1018  BP: 124/72  Pulse: 78  Temp: 98.4 F (36.9 C)  SpO2: 97%    Immunization History  Administered Date(s) Administered   Hepatitis B, ADULT 01/09/2016, 02/11/2016, 07/14/2016   Influenza,inj,Quad PF,6+ Mos 06/07/2015, 02/11/2016   Influenza-Unspecified 02/12/2018, 03/02/2019, 03/02/2023   PFIZER(Purple Top)SARS-COV-2 Vaccination 08/12/2019, 09/06/2019, 08/23/2020   PPD Test 06/05/2015, 05/23/2019   Pneumococcal Conjugate-13 02/16/2012   Td 10/14/2005   Tdap 01/22/2016    Health Maintenance Due  Topic Date Due   MAMMOGRAM  06/12/2021   OPHTHALMOLOGY EXAM  05/22/2022   Eye exam was August-we sent for report  Shingrix - declines   Mammogram 05/2020 Self breast exam-no changes   Gyn health Pap 06/2021 normal with neg HPV Sees gyn  Colon cancer screening  Colonoscopy 07/2019  polyps with 7 y recall   Bone health  Dexa  09/2021 normal at gyn  Falls- none  Fractures-none  Supplements  Last vitamin D  Lab Results  Component Value Date   VD25OH 48.61 05/21/2023    Exercise :  Just a little walking  Wants to add more    Mood    05/21/2023   10:21 AM 01/01/2023   10:06 AM 06/09/2022   10:45 AM 11/08/2020    9:35 AM 01/04/2020    9:18 AM  Depression screen PHQ 2/9  Decreased Interest 0 0 0 0 0  Down, Depressed, Hopeless 0 0 0 1 0  PHQ - 2 Score 0 0 0 1 0  Altered sleeping 1 1 1 1    Tired, decreased energy 1 1 1  0   Change in appetite 0 0 0 0   Feeling bad or failure about yourself  0 0 0 0   Trouble concentrating 0 0 0 0   Moving slowly or fidgety/restless 0 0 0 0   Suicidal thoughts 0 0 0 0   PHQ-9 Score 2 2  2 2    Difficult doing work/chores Not difficult at all Not difficult at all Not difficult at all Not difficult at all     HTN bp is stable today  No cp or palpitations or headaches or edema  No side effects to medicines  BP Readings from Last 3 Encounters:  05/21/23 124/72  05/13/23 122/84  01/13/23 132/76    Hydrochlorothiazide  25 mg every other day    Lab Results  Component Value Date   NA 138 05/21/2023   K 4.0 05/21/2023   CO2 25 05/21/2023   GLUCOSE 120 (H) 05/21/2023   BUN 14 05/21/2023   CREATININE 0.76 05/21/2023   CALCIUM  9.8 05/21/2023   GFR 85.84 05/21/2023   Due for labs   Dm2 Lab Results  Component Value Date   HGBA1C 6.1 (A) 05/13/2023  Sees endocrinology  Mounjaro  5 mg weekly   Eating healthier  Eating less   Not someone who craves sweets  Does not buy junk   Hyperlipidemia Lab Results  Component Value Date   CHOL 243 (H) 05/21/2023   HDL 63.40 05/21/2023   LDLCALC 148 (H) 05/21/2023   TRIG 158.0 (H)  05/21/2023   CHOLHDL 4 05/21/2023   Taking crestor  5 mg four times weekly     Patient Active Problem List   Diagnosis Date Noted   Encounter for screening mammogram for breast cancer 05/21/2023   Diabetes mellitus treated with injections of non-insulin medication (HCC) 05/21/2023   Dyslipidemia 01/13/2023   Knee pain, right 01/01/2023   Atypical chest pain 06/09/2022   Diabetes mellitus without complication (HCC) 04/18/2021   Left knee pain 11/22/2020   Need for hepatitis C screening test 05/15/2019   Encounter for screening for HIV 05/15/2019   Screening-pulmonary TB 05/15/2019   Colon cancer screening 05/15/2019   Vitamin D  deficiency 11/22/2018   Numbness and tingling sensation of skin 10/04/2018   Neck pain 08/18/2017   Productive cough 08/18/2017   Morbid obesity (HCC) 07/07/2015   Essential hypertension 07/05/2015   Hyperlipidemia associated with type 2 diabetes mellitus (HCC) 08/31/2014   Type 2 diabetes mellitus with  hyperglycemia, without long-term current use of insulin (HCC) 02/15/2014   Leg cramps 02/06/2014   History of shingles 09/18/2013   Carpal tunnel syndrome 05/31/2013   Routine general medical examination at a health care facility 07/04/2012   HEARING LOSS, RIGHT EAR 07/26/2008   ALLERGIC RHINITIS 06/28/2008   Past Medical History:  Diagnosis Date   Allergy    Anemia    with pregnancy    COVID-19 virus infection 04/17/2019   Diabetes mellitus without complication (HCC)    Hyperlipidemia    Hypertension    Past Surgical History:  Procedure Laterality Date   CARPAL TUNNEL RELEASE Bilateral 04/24/2014   CESAREAN SECTION     TUBAL LIGATION     Social History   Tobacco Use   Smoking status: Never   Smokeless tobacco: Never  Vaping Use   Vaping status: Never Used  Substance Use Topics   Alcohol use: Yes    Alcohol/week: 0.0 standard drinks of alcohol    Comment: Rare   Drug use: No   Family History  Problem Relation Age of Onset   Hypertension Mother    Diabetes Mother    Heart failure Mother    Hypertension Father    Diabetes Sister    Kidney failure Brother    Colon cancer Neg Hx    Colon polyps Neg Hx    Esophageal cancer Neg Hx    Rectal cancer Neg Hx    Stomach cancer Neg Hx    Allergies  Allergen Reactions   Metformin  And Related Other (See Comments)    The XR caused sore throat, HA, GI issues   Current Outpatient Medications on File Prior to Visit  Medication Sig Dispense Refill   Blood Glucose Monitoring Suppl (ONETOUCH VERIO REFLECT) w/Device KIT Use to check blood sugar 2 times a day 1 kit 0   famotidine  (PEPCID ) 20 MG tablet Take 1 tablet (20 mg total) by mouth 2 (two) times daily. 60 tablet 1   hydrochlorothiazide  (HYDRODIURIL ) 25 MG tablet TAKE 1 TABLET BY MOUTH EVERY OTHER DAY 15 tablet 0   Lancets (ONETOUCH ULTRASOFT) lancets Twice daily 100 each 12   meloxicam  (MOBIC ) 15 MG tablet Take 1 tablet (15 mg total) by mouth daily as needed for pain.  With food 30 tablet 0   ONETOUCH VERIO test strip USE AS INSTRUCTED TO CHECK BLOOD SUGAR 2 TIMES DAILY 50 strip 25   rosuvastatin  (CRESTOR ) 5 MG tablet Take 1 tablet (5 mg total) by mouth as directed. Four  times a week 52 tablet 3  tirzepatide  (MOUNJARO ) 5 MG/0.5ML Pen Inject 5 mg into the skin once a week. 6 mL 3   Vitamin D , Ergocalciferol , (DRISDOL ) 1.25 MG (50000 UNIT) CAPS capsule TAKE 1 CAPSULE (50,000 UNITS TOTAL) BY MOUTH TWO TIMES A WEEK 8 capsule 12   No current facility-administered medications on file prior to visit.    Review of Systems  Constitutional:  Negative for activity change, appetite change, fatigue, fever and unexpected weight change.  HENT:  Negative for congestion, ear pain, rhinorrhea, sinus pressure and sore throat.   Eyes:  Negative for pain, redness and visual disturbance.  Respiratory:  Negative for cough, shortness of breath and wheezing.   Cardiovascular:  Negative for chest pain and palpitations.  Gastrointestinal:  Negative for abdominal pain, blood in stool, constipation and diarrhea.  Endocrine: Negative for polydipsia and polyuria.  Genitourinary:  Negative for dysuria, frequency and urgency.  Musculoskeletal:  Negative for arthralgias, back pain and myalgias.  Skin:  Negative for pallor and rash.  Allergic/Immunologic: Negative for environmental allergies.  Neurological:  Negative for dizziness, syncope and headaches.  Hematological:  Negative for adenopathy. Does not bruise/bleed easily.  Psychiatric/Behavioral:  Negative for decreased concentration and dysphoric mood. The patient is not nervous/anxious.        Objective:   Physical Exam Constitutional:      General: She is not in acute distress.    Appearance: Normal appearance. She is well-developed. She is obese. She is not ill-appearing or diaphoretic.  HENT:     Head: Normocephalic and atraumatic.     Right Ear: Tympanic membrane, ear canal and external ear normal.     Left Ear:  Tympanic membrane, ear canal and external ear normal.     Nose: Nose normal. No congestion.     Mouth/Throat:     Mouth: Mucous membranes are moist.     Pharynx: Oropharynx is clear. No posterior oropharyngeal erythema.  Eyes:     General: No scleral icterus.    Extraocular Movements: Extraocular movements intact.     Conjunctiva/sclera: Conjunctivae normal.     Pupils: Pupils are equal, round, and reactive to light.  Neck:     Thyroid : No thyromegaly.     Vascular: No carotid bruit or JVD.  Cardiovascular:     Rate and Rhythm: Normal rate and regular rhythm.     Pulses: Normal pulses.     Heart sounds: Normal heart sounds.     No gallop.  Pulmonary:     Effort: Pulmonary effort is normal. No respiratory distress.     Breath sounds: Normal breath sounds. No wheezing.     Comments: Good air exch Chest:     Chest wall: No tenderness.  Abdominal:     General: Bowel sounds are normal. There is no distension or abdominal bruit.     Palpations: Abdomen is soft. There is no mass.     Tenderness: There is no abdominal tenderness.     Hernia: No hernia is present.  Genitourinary:    Comments: Breast and pelvic exam are done by gyn provider   Musculoskeletal:        General: No tenderness. Normal range of motion.     Cervical back: Normal range of motion and neck supple. No rigidity. No muscular tenderness.     Right lower leg: No edema.     Left lower leg: No edema.     Comments: No kyphosis   Lymphadenopathy:     Cervical: No cervical adenopathy.  Skin:  General: Skin is warm and dry.     Coloration: Skin is not pale.     Findings: No erythema or rash.     Comments: Some skin tags on neck   Neurological:     Mental Status: She is alert. Mental status is at baseline.     Cranial Nerves: No cranial nerve deficit.     Motor: No abnormal muscle tone.     Coordination: Coordination normal.     Gait: Gait normal.     Deep Tendon Reflexes: Reflexes are normal and symmetric.  Reflexes normal.  Psychiatric:        Mood and Affect: Mood normal.        Cognition and Memory: Cognition and memory normal.           Assessment & Plan:   Problem List Items Addressed This Visit       Cardiovascular and Mediastinum   Essential hypertension   bp in fair control at this time  BP Readings from Last 1 Encounters:  05/21/23 124/72   No changes needed Most recent labs reviewed  Disc lifstyle change with low sodium diet and exercise  Continues hctz 25 mg every other day      Relevant Orders   TSH (Completed)   Lipid panel (Completed)   Comprehensive metabolic panel (Completed)   CBC with Differential/Platelet (Completed)     Endocrine   Type 2 diabetes mellitus with hyperglycemia, without long-term current use of insulin (HCC)   Sees endocrinology  Lab Results  Component Value Date   HGBA1C 6.1 (A) 05/13/2023   Sent for eye exam Microalb utd  On mounjaro  5 mg weekly / tolerating and losing weight  Encouraged strength training to prevent loss of muscle  Normal foot exam       Hyperlipidemia associated with type 2 diabetes mellitus (HCC)   Disc goals for lipids and reasons to control them Rev last labs with pt Rev low sat fat diet in detail  Lipid panel today  Crestor  5 mg -taking four days weekly (as much as tolerated)        Relevant Orders   Lipid panel (Completed)   Diabetes mellitus treated with injections of non-insulin medication (HCC)   Under care of endocrinology  Mounjaro  5 mg weekly  Doing well with weight loss and glucose control Lab Results  Component Value Date   HGBA1C 6.1 (A) 05/13/2023           Other   Vitamin D  deficiency   D level today   Vitamin D  level is therapeutic with current supplementation Disc importance of this to bone and overall health       Relevant Orders   VITAMIN D  25 Hydroxy (Vit-D Deficiency, Fractures) (Completed)   Routine general medical examination at a health care facility -  Primary   Reviewed health habits including diet and exercise and skin cancer prevention Reviewed appropriate screening tests for age  Also reviewed health mt list, fam hx and immunization status , as well as social and family history   See HPI Labs reviewed and ordered Declines shingrix vaccine  Sent for eye exam report  Colonoscopy 07/2019 with 7 y recall Pap utd Dexa normal in 2023 Discussed fall prevention, supplements and exercise for bone density  PHQ 2 Health Maintenance  Topic Date Due   Mammogram  06/12/2021   Eye exam for diabetics  05/22/2022   Zoster (Shingles) Vaccine (1 of 2) 08/19/2023*   COVID-19 Vaccine (4 -  2024-25 season) 06/05/2024*   Hemoglobin A1C  11/11/2023   Yearly kidney function blood test for diabetes  01/13/2024   Yearly kidney health urinalysis for diabetes  01/13/2024   Complete foot exam   05/20/2024   DTaP/Tdap/Td vaccine (3 - Td or Tdap) 01/21/2026   Pap with HPV screening  06/25/2026   Colon Cancer Screening  08/04/2026   Flu Shot  Completed   Hepatitis C Screening  Completed   HIV Screening  Completed   HPV Vaccine  Aged Out  *Topic was postponed. The date shown is not the original due date.         Encounter for screening mammogram for breast cancer   Mammogram ordered Pt will call to schedule  Sees gyn for exams      Relevant Orders   MM 3D SCREENING MAMMOGRAM BILATERAL BREAST

## 2023-05-21 NOTE — Assessment & Plan Note (Signed)
 Disc goals for lipids and reasons to control them Rev last labs with pt Rev low sat fat diet in detail  Lipid panel today  Crestor 5 mg -taking four days weekly (as much as tolerated)

## 2023-05-21 NOTE — Assessment & Plan Note (Signed)
 Reviewed health habits including diet and exercise and skin cancer prevention Reviewed appropriate screening tests for age  Also reviewed health mt list, fam hx and immunization status , as well as social and family history   See HPI Labs reviewed and ordered Declines shingrix vaccine  Sent for eye exam report  Colonoscopy 07/2019 with 7 y recall Pap utd Dexa normal in 2023 Discussed fall prevention, supplements and exercise for bone density  PHQ 2 Health Maintenance  Topic Date Due   Mammogram  06/12/2021   Eye exam for diabetics  05/22/2022   Zoster (Shingles) Vaccine (1 of 2) 08/19/2023*   COVID-19 Vaccine (4 - 2024-25 season) 06/05/2024*   Hemoglobin A1C  11/11/2023   Yearly kidney function blood test for diabetes  01/13/2024   Yearly kidney health urinalysis for diabetes  01/13/2024   Complete foot exam   05/20/2024   DTaP/Tdap/Td vaccine (3 - Td or Tdap) 01/21/2026   Pap with HPV screening  06/25/2026   Colon Cancer Screening  08/04/2026   Flu Shot  Completed   Hepatitis C Screening  Completed   HIV Screening  Completed   HPV Vaccine  Aged Out  *Topic was postponed. The date shown is not the original due date.

## 2023-05-21 NOTE — Assessment & Plan Note (Signed)
 Under care of endocrinology  Mounjaro 5 mg weekly  Doing well with weight loss and glucose control Lab Results  Component Value Date   HGBA1C 6.1 (A) 05/13/2023

## 2023-05-21 NOTE — Assessment & Plan Note (Signed)
 bp in fair control at this time  BP Readings from Last 1 Encounters:  05/21/23 124/72   No changes needed Most recent labs reviewed  Disc lifstyle change with low sodium diet and exercise  Continues hctz 25 mg every other day

## 2023-05-22 ENCOUNTER — Encounter: Payer: Self-pay | Admitting: Family Medicine

## 2023-05-24 NOTE — Progress Notes (Signed)
 Pt noted she is starting to take at night now and may be able to tolerate more Advised to try daily and then re check in about a month

## 2023-06-03 IMAGING — DX DG KNEE COMPLETE 4+V*L*
4 series · 4 of 4 positions shown · non-contrast
Comparison: None.

CLINICAL DATA: Left knee pain and swelling

EXAM:
LEFT KNEE - COMPLETE 4+ VIEW

[knee ap]
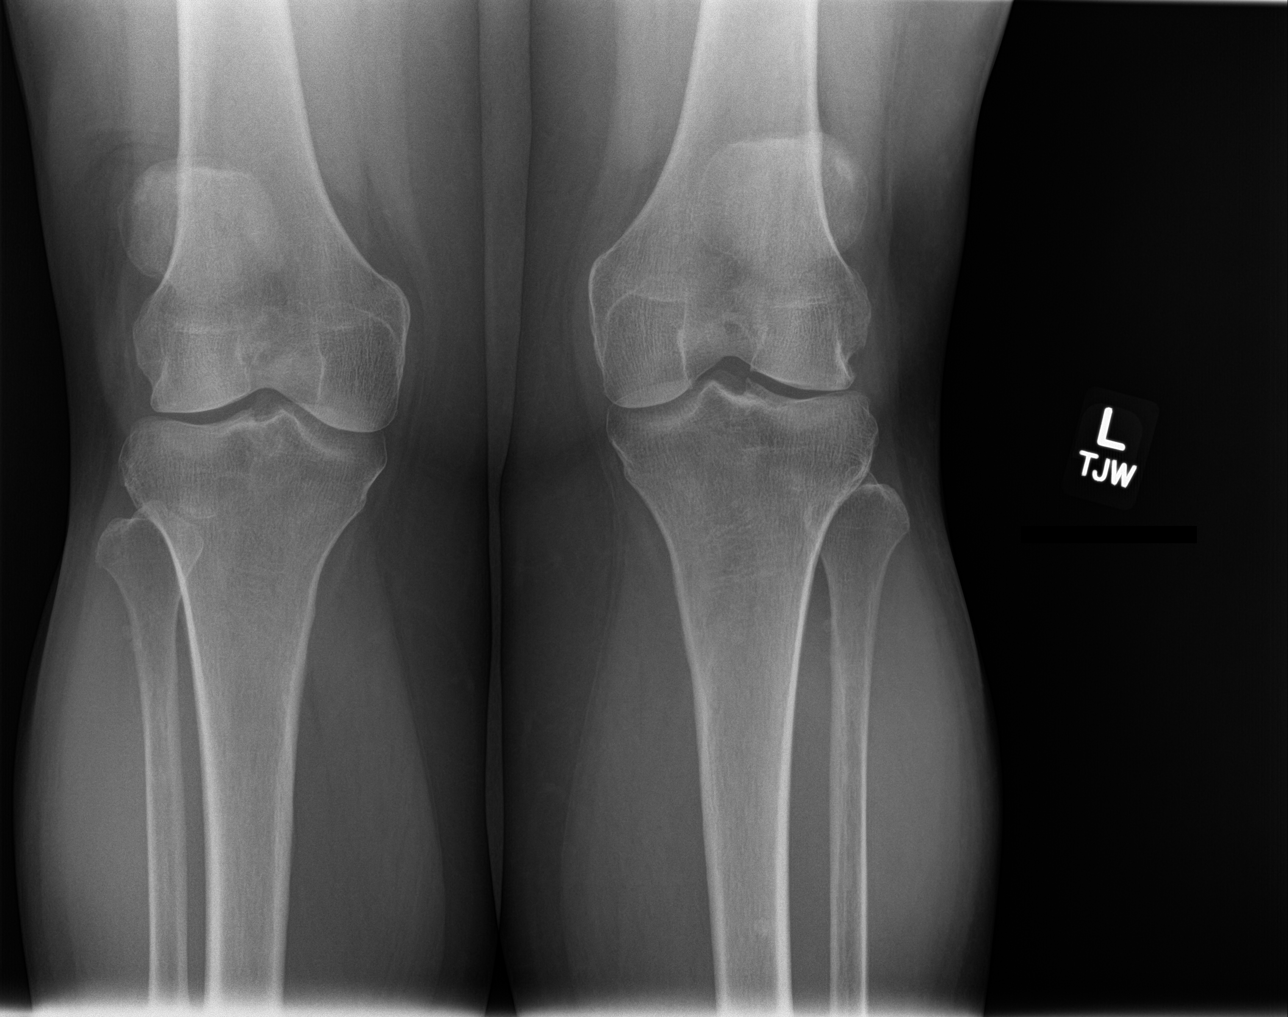

[knee tunnel]
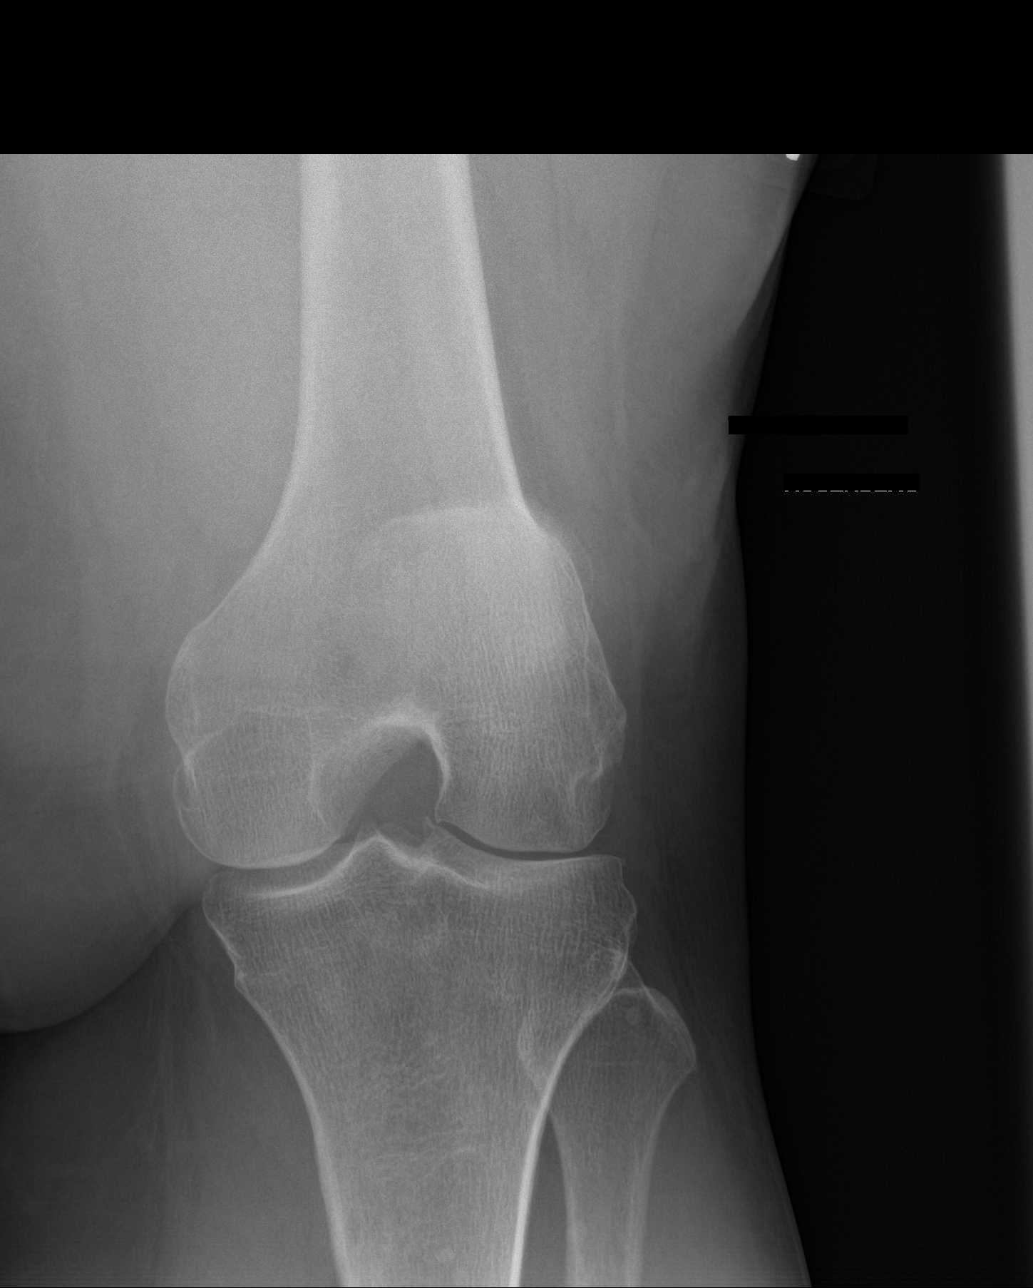

[knee lat]
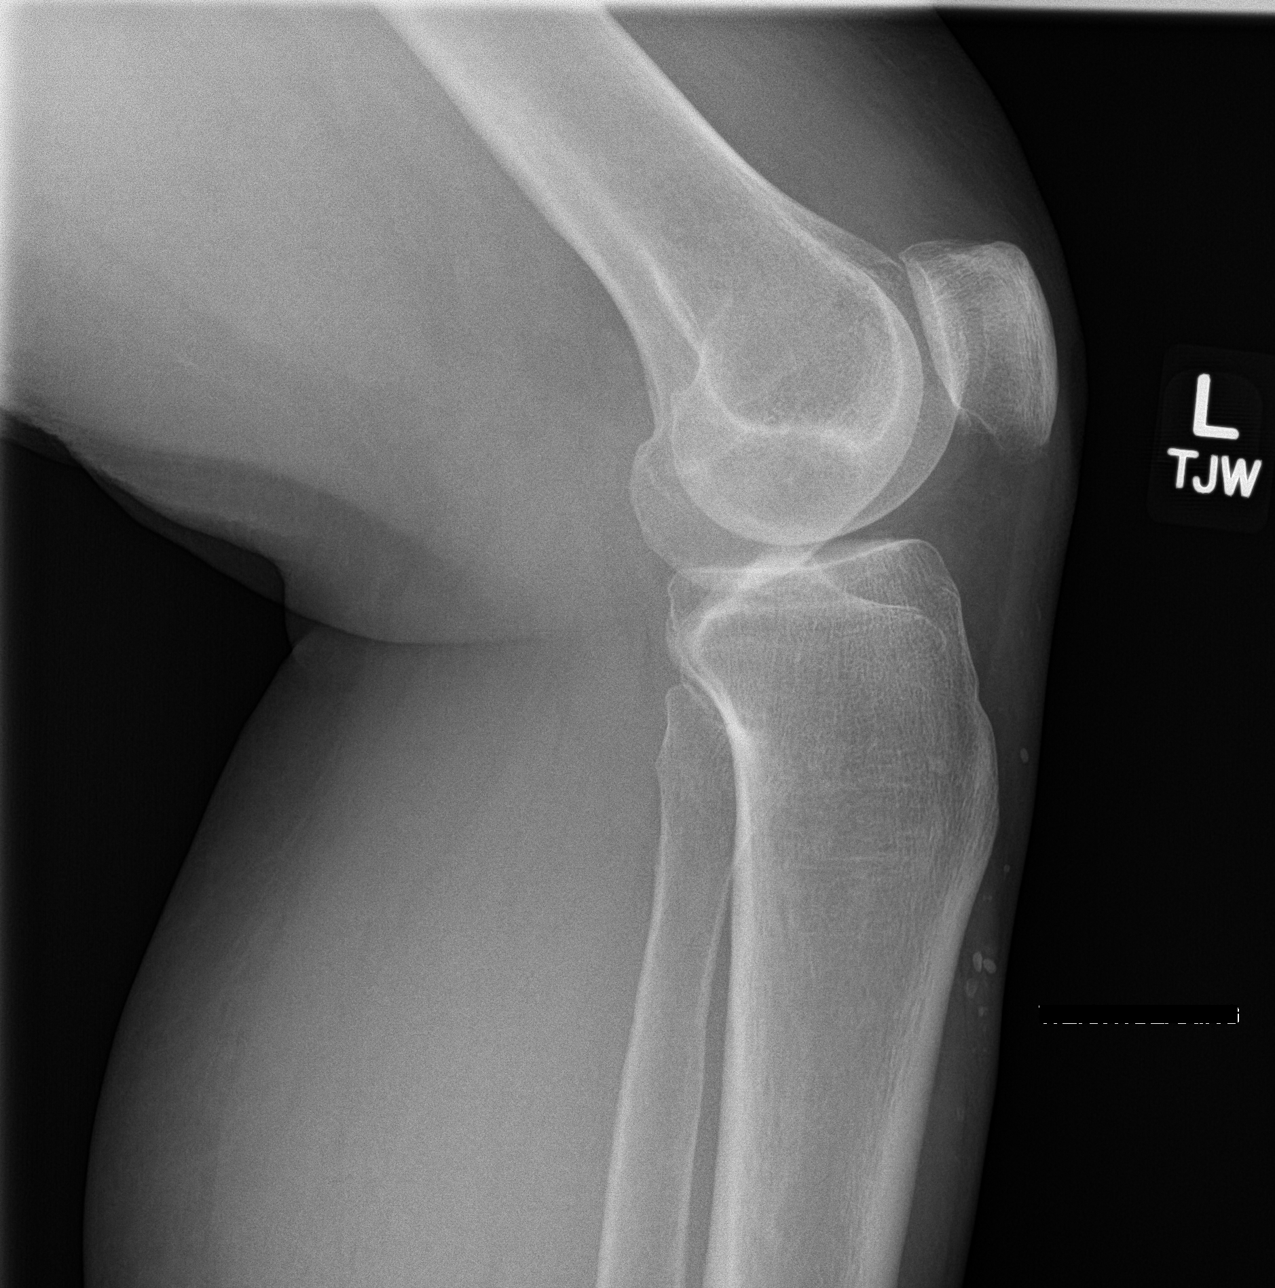

[patella skyline]
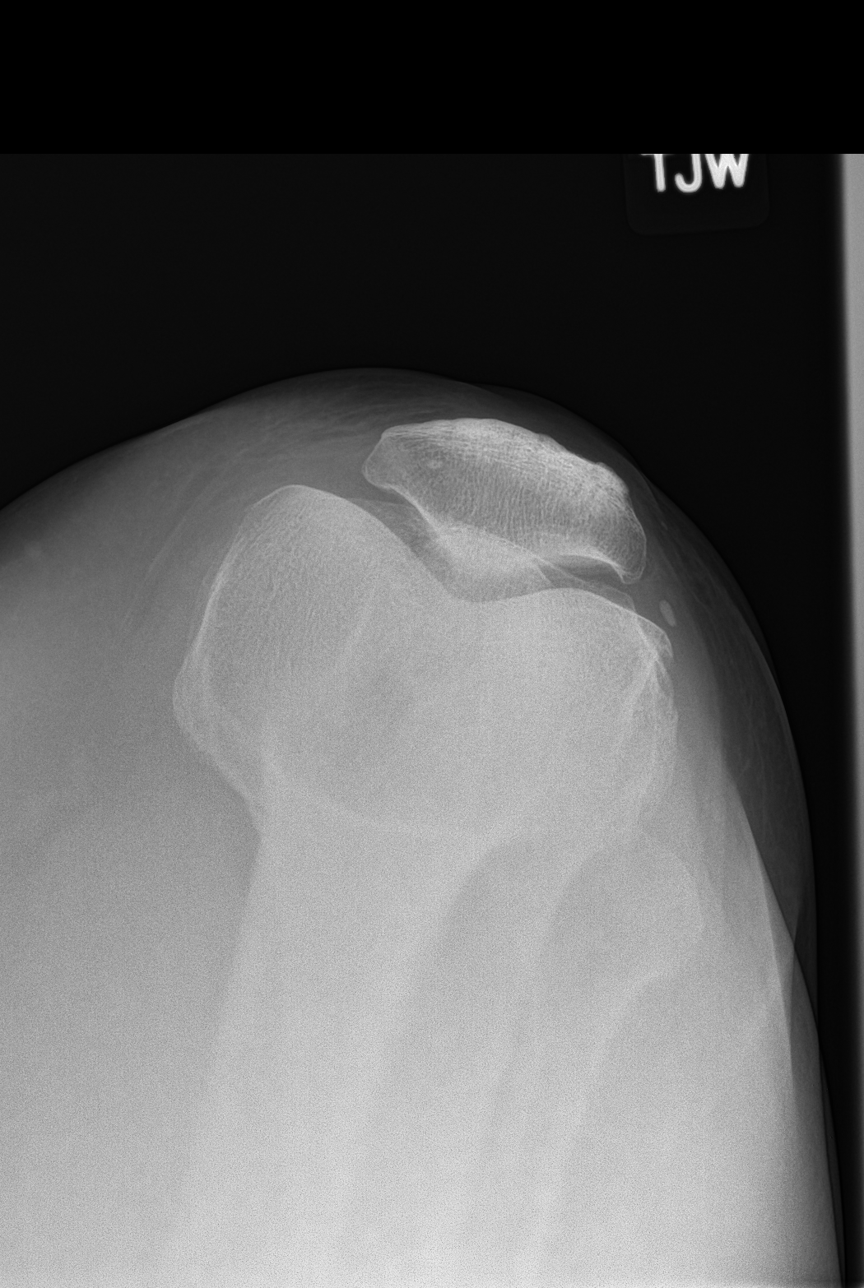

[4 of 4 positions shown; findings below may reference images not displayed]

FINDINGS: Frontal, lateral, oblique, and sunrise views of the left knee are
obtained. No fracture, subluxation, or dislocation. Joint spaces are
well preserved. No joint effusion. Limited imaging of the right knee
is unremarkable.
IMPRESSION: 1. Unremarkable left knee.

## 2023-06-09 ENCOUNTER — Ambulatory Visit
Admission: RE | Admit: 2023-06-09 | Discharge: 2023-06-09 | Disposition: A | Discharge status: Home / Self Care | Payer: 59 | Source: Ambulatory Visit | Attending: Family Medicine | Admitting: Family Medicine

## 2023-06-12 ENCOUNTER — Encounter: Payer: Self-pay | Admitting: Family Medicine

## 2023-06-14 ENCOUNTER — Encounter: Payer: Self-pay | Admitting: Family Medicine

## 2023-06-20 ENCOUNTER — Other Ambulatory Visit: Payer: Self-pay | Admitting: Family Medicine

## 2023-09-24 ENCOUNTER — Other Ambulatory Visit: Payer: Self-pay | Admitting: Internal Medicine

## 2023-09-30 ENCOUNTER — Ambulatory Visit: Payer: Self-pay | Admitting: Family Medicine

## 2023-09-30 ENCOUNTER — Ambulatory Visit: Admitting: Family Medicine

## 2023-09-30 ENCOUNTER — Encounter: Payer: Self-pay | Admitting: Family Medicine

## 2023-09-30 VITALS — BP 134/86 | HR 97 | Temp 98.4°F | Ht 63.5 in | Wt 204.4 lb

## 2023-09-30 DIAGNOSIS — E1169 Type 2 diabetes mellitus with other specified complication: Secondary | ICD-10-CM | POA: Diagnosis not present

## 2023-09-30 DIAGNOSIS — R202 Paresthesia of skin: Secondary | ICD-10-CM

## 2023-09-30 DIAGNOSIS — H538 Other visual disturbances: Secondary | ICD-10-CM

## 2023-09-30 DIAGNOSIS — E785 Hyperlipidemia, unspecified: Secondary | ICD-10-CM | POA: Diagnosis not present

## 2023-09-30 LAB — COMPREHENSIVE METABOLIC PANEL WITH GFR
ALT: 13 U/L (ref 0–35)
AST: 14 U/L (ref 0–37)
Albumin: 4 g/dL (ref 3.5–5.2)
Alkaline Phosphatase: 62 U/L (ref 39–117)
BUN: 16 mg/dL (ref 6–23)
CO2: 25 meq/L (ref 19–32)
Calcium: 9 mg/dL (ref 8.4–10.5)
Chloride: 107 meq/L (ref 96–112)
Creatinine, Ser: 0.65 mg/dL (ref 0.40–1.20)
GFR: 96.21 mL/min (ref >60.00)
Glucose, Bld: 112 mg/dL — ABNORMAL HIGH (ref 70–99)
Potassium: 3.9 meq/L (ref 3.5–5.1)
Sodium: 139 meq/L (ref 135–145)
Total Bilirubin: 0.5 mg/dL (ref 0.2–1.2)
Total Protein: 7.3 g/dL (ref 6.0–8.3)

## 2023-09-30 LAB — LIPID PANEL
Cholesterol: 208 mg/dL — ABNORMAL HIGH (ref 0–200)
HDL: 57.6 mg/dL (ref >39.00)
LDL Cholesterol: 128 mg/dL — ABNORMAL HIGH (ref 0–99)
NonHDL: 150.39
Total CHOL/HDL Ratio: 4
Triglycerides: 111 mg/dL (ref 0.0–149.0)
VLDL: 22.2 mg/dL (ref 0.0–40.0)

## 2023-09-30 LAB — SEDIMENTATION RATE: Sed Rate: 22 mm/h (ref 0–30)

## 2023-09-30 NOTE — Assessment & Plan Note (Signed)
 Episodic  3 episodes  Bilateral/not severe (no curtain or darkness or loss of vision) , no double vision  One episode followed by brief left arm paresthesia  Last oph exam 01/223 Grossly normal exam and vision screen today  Denies headache  Encouraged follow up with oph  Lab today for ESR Consider MRI brain in light of arm paresthesia   Call back and Er precautions noted in detail today

## 2023-09-30 NOTE — Patient Instructions (Addendum)
 Call your eye specialist- get in for a visit to discuss the blurred vision episodes  It this happens again let us    I would like to get  MRI of your head/brain  Let's see if insurance will cover   We will reach out about that  If you don't hear from someone in 1-2 weeks let us  know   If any stroke symptoms -call 911  One sided weakness/numbness that does not improve Facial droop Trouble speaking

## 2023-09-30 NOTE — Assessment & Plan Note (Signed)
 Disc goals for lipids and reasons to control them Rev last labs with pt Rev low sat fat diet in detail Now taking crestor  5 mg daily  Diet is better Lab today

## 2023-09-30 NOTE — Assessment & Plan Note (Signed)
 Lower arm Not resembling carpal tunnel  Relatively brief after episode of blurred vision  No change in strength Normal exam today   In light of episodic nature and vision change -consider MRI of head  Will see if this is covered by ins   Call back and Er precautions noted in detail today   Reviewed signs and symptoms of cva in detail

## 2023-09-30 NOTE — Progress Notes (Signed)
 Subjective:    Patient ID: Krista Newton, female    DOB: 20-Aug-1965, 58 y.o.   MRN: [id number]  HPI  Wt Readings from Last 3 Encounters:  09/30/23 204 lb 6 oz (92.7 kg)  05/21/23 212 lb 8 oz (96.4 kg)  05/13/23 214 lb 9.6 oz (97.3 kg)   35.64 kg/m  Vitals:   09/30/23 1202  BP: 134/86  Pulse: 97  Temp: 98.4 F (36.9 C)  SpO2: 95%   Pt presents with episode of blurry vision  Needs cholesterol re check   Has had episode in 1992 (only other time)  Recent 5/11 Also c/o left arm numbness and pain since 5/14  Had a tooth ache 2 wk ago (antibiotic from doctor and few tylenol and advil)-sees dentist today   Sunday- blurry vision /subsided - few minutes / less than 5  Both eyes -mild to moderate  No darkness/ no curtain  no total loss of vision  Blinked eyes and it cleared up    Yesterday -another episode of blurriness 45 minutes later a numb feeling between left wrist and arm  No loss of strength   Has had carpal tunnel surgery both hands in past Did not feel like that      No trauma  No new eye products   No family neuro symptoms Mother had vision problems with dm    No sleep problems  Recent stress Husband out of work due to foot surgery /then another injury  Some family issues with son also     Episode in 83- ate sausage biscuit and took excedrin migraine and tongue went numb and then blurry vision and then right arm symptoms  Went away after 20 minutes  Saw Dr Ronald Cockayne at [facility] then work up neg- blood work  ? If stress induced   MRI head in 07 for hearing loss  IMPRESSION:    1.     There are a few periventricular areas of nonspecific increased  signals noted on the FLAIR images and T2-weighted images.  No enhancement  is  seen.  2.     Incidentally noted is a retention cyst at the base of the RIGHT  maxillary sinus.  3.     There is no evidence of acoustic neuroma identified.    Thank you for this opportunity to contribute to the  care of your patient.    Oph exam 01/2023  No diabetic retinopathy  No eye problems besides small cataract  Does wear glasses   Vision Screening   Right eye Left eye Both eyes  Without correction     With correction 20/20 20/15 20/13      Cholesterol Lab Results  Component Value Date   CHOL 243 (H) 05/21/2023   HDL 63.40 05/21/2023   LDLCALC 148 (H) 05/21/2023   TRIG 158.0 (H) 05/21/2023   CHOLHDL 4 05/21/2023   On crestor  5 mg now  Taking at night     Patient Active Problem List   Diagnosis Date Noted  . Paresthesia of arm 09/30/2023  . Blurred vision 09/30/2023  . Encounter for screening mammogram for breast cancer 05/21/2023  . Diabetes mellitus treated with injections of non-insulin medication (HCC) 05/21/2023  . Dyslipidemia 01/13/2023  . Knee pain, right 01/01/2023  . Atypical chest pain 06/09/2022  . Diabetes mellitus without complication (HCC) 04/18/2021  . Left knee pain 11/22/2020  . Need for hepatitis C screening test 05/15/2019  . Encounter for screening for HIV 05/15/2019  .  Screening-pulmonary TB 05/15/2019  . Colon cancer screening 05/15/2019  . Vitamin D  deficiency 11/22/2018  . Numbness and tingling sensation of skin 10/04/2018  . Neck pain 08/18/2017  . Productive cough 08/18/2017  . Morbid obesity (HCC) 07/07/2015  . Essential hypertension 07/05/2015  . Hyperlipidemia associated with type 2 diabetes mellitus (HCC) 08/31/2014  . Type 2 diabetes mellitus with hyperglycemia, without long-term current use of insulin (HCC) 02/15/2014  . Leg cramps 02/06/2014  . History of shingles 09/18/2013  . Carpal tunnel syndrome 05/31/2013  . Routine general medical examination at a health care facility 07/04/2012  . HEARING LOSS, RIGHT EAR 07/26/2008  . ALLERGIC RHINITIS 06/28/2008   Past Medical History:  Diagnosis Date  . Allergy   . Anemia    with pregnancy   . COVID-19 virus infection 04/17/2019  . Diabetes mellitus without complication (HCC)   .  Hyperlipidemia   . Hypertension    Past Surgical History:  Procedure Laterality Date  . CARPAL TUNNEL RELEASE Bilateral 04/24/2014  . CESAREAN SECTION    . TUBAL LIGATION     Social History   Tobacco Use  . Smoking status: Never  . Smokeless tobacco: Never  Vaping Use  . Vaping status: Never Used  Substance Use Topics  . Alcohol use: Yes    Alcohol/week: 0.0 standard drinks of alcohol    Comment: Rare  . Drug use: No   Family History  Problem Relation Age of Onset  . Hypertension Mother   . Diabetes Mother   . Heart failure Mother   . Hypertension Father   . Diabetes Sister   . Kidney failure Brother   . Colon cancer Neg Hx   . Colon polyps Neg Hx   . Esophageal cancer Neg Hx   . Rectal cancer Neg Hx   . Stomach cancer Neg Hx    Allergies  Allergen Reactions  . Metformin  And Related Other (See Comments)    The XR caused sore throat, HA, GI issues   Current Outpatient Medications on File Prior to Visit  Medication Sig Dispense Refill  . Blood Glucose Monitoring Suppl (ONETOUCH VERIO REFLECT) w/Device KIT Use to check blood sugar 2 times a day 1 kit 0  . famotidine  (PEPCID ) 20 MG tablet Take 1 tablet (20 mg total) by mouth 2 (two) times daily. 60 tablet 1  . hydrochlorothiazide  (HYDRODIURIL ) 25 MG tablet TAKE 1 TABLET BY MOUTH EVERY OTHER DAY 45 tablet 2  . Lancets (ONETOUCH ULTRASOFT) lancets Twice daily 100 each 12  . meloxicam  (MOBIC ) 15 MG tablet Take 1 tablet (15 mg total) by mouth daily as needed for pain. With food 30 tablet 0  . ONETOUCH VERIO test strip USE AS INSTRUCTED TO CHECK BLOOD SUGAR 2 TIMES DAILY 50 strip 25  . rosuvastatin  (CRESTOR ) 5 MG tablet Take 1 tablet (5 mg total) by mouth as directed. Four  times a week 52 tablet 3  . tirzepatide  (MOUNJARO ) 5 MG/0.5ML Pen Inject 5 mg into the skin once a week. 6 mL 3  . Vitamin D , Ergocalciferol , (DRISDOL ) 1.25 MG (50000 UNIT) CAPS capsule TAKE 1 CAPSULE (50,000 UNITS TOTAL) BY MOUTH TWO TIMES A WEEK 8  capsule 12   No current facility-administered medications on file prior to visit.    Review of Systems  Constitutional:  Negative for activity change, appetite change, fatigue, fever and unexpected weight change.  HENT:  Negative for congestion, ear pain, rhinorrhea, sinus pressure and sore throat.  Tooth ache is better   Eyes:  Positive for visual disturbance. Negative for photophobia, pain, discharge, redness and itching.  Respiratory:  Negative for cough, shortness of breath and wheezing.   Cardiovascular:  Negative for chest pain and palpitations.  Gastrointestinal:  Negative for abdominal pain, blood in stool, constipation and diarrhea.  Endocrine: Negative for polydipsia and polyuria.  Genitourinary:  Negative for dysuria, frequency and urgency.  Musculoskeletal:  Positive for back pain. Negative for arthralgias and myalgias.  Skin:  Negative for pallor and rash.  Allergic/Immunologic: Negative for environmental allergies.  Neurological:  Positive for numbness. Negative for dizziness, tremors, seizures, syncope, facial asymmetry, speech difficulty, weakness, light-headedness and headaches.       Had one pain in right occiput area -brief and now resolved   Hematological:  Negative for adenopathy. Does not bruise/bleed easily.  Psychiatric/Behavioral:  Negative for decreased concentration and dysphoric mood. The patient is not nervous/anxious.        Some new stressors        Objective:   Physical Exam Constitutional:      General: She is not in acute distress.    Appearance: Normal appearance. She is well-developed. She is obese. She is not ill-appearing or diaphoretic.  HENT:     Head: Normocephalic and atraumatic.     Right Ear: External ear normal.     Left Ear: External ear normal.     Nose: Nose normal.     Mouth/Throat:     Pharynx: No oropharyngeal exudate.  Eyes:     General: No scleral icterus.       Right eye: No discharge.        Left eye: No discharge.      Conjunctiva/sclera: Conjunctivae normal.     Pupils: Pupils are equal, round, and reactive to light.     Comments: No nystagmus  Neck:     Thyroid : No thyromegaly.     Vascular: No carotid bruit or JVD.     Trachea: No tracheal deviation.  Cardiovascular:     Rate and Rhythm: Normal rate and regular rhythm.     Heart sounds: Normal heart sounds. No murmur heard. Pulmonary:     Effort: Pulmonary effort is normal. No respiratory distress.     Breath sounds: Normal breath sounds. No wheezing or rales.  Abdominal:     General: Bowel sounds are normal. There is no distension.     Palpations: Abdomen is soft. There is no mass.     Tenderness: There is no abdominal tenderness.  Musculoskeletal:        General: No tenderness.     Cervical back: Full passive range of motion without pain, normal range of motion and neck supple.  Lymphadenopathy:     Cervical: No cervical adenopathy.  Skin:    General: Skin is warm and dry.     Coloration: Skin is not pale.     Findings: No rash.  Neurological:     Mental Status: She is alert and oriented to person, place, and time.     Cranial Nerves: No cranial nerve deficit, dysarthria or facial asymmetry.     Sensory: Sensation is intact. No sensory deficit.     Motor: No weakness, tremor, atrophy, abnormal muscle tone, seizure activity or pronator drift.     Coordination: Romberg sign negative. Coordination normal. Finger-Nose-Finger Test normal.     Gait: Gait and tandem walk normal.     Deep Tendon Reflexes: Reflexes are normal and symmetric.  Comments: No focal cerebellar signs   Psychiatric:        Behavior: Behavior normal.        Thought Content: Thought content normal.          Assessment & Plan:   Problem List Items Addressed This Visit       Endocrine   Hyperlipidemia associated with type 2 diabetes mellitus (HCC)   Disc goals for lipids and reasons to control them Rev last labs with pt Rev low sat fat diet in  detail Now taking crestor  5 mg daily  Diet is better Lab today      Relevant Orders   Comprehensive metabolic panel with GFR   Lipid panel     Other   Paresthesia of arm   Lower arm Not resembling carpal tunnel  Relatively brief after episode of blurred vision  No change in strength Normal exam today   In light of episodic nature and vision change -consider MRI of head  Will see if this is covered by ins   Call back and Er precautions noted in detail today   Reviewed signs and symptoms of cva in detail       Blurred vision - Primary   Episodic  3 episodes  Bilateral/not severe (no curtain or darkness or loss of vision) , no double vision  One episode followed by brief left arm paresthesia  Last oph exam 01/223 Grossly normal exam and vision screen today  Denies headache  Encouraged follow up with oph  Lab today for ESR Consider MRI brain in light of arm paresthesia   Call back and Er precautions noted in detail today        Relevant Orders   Sedimentation Rate

## 2023-11-06 ENCOUNTER — Ambulatory Visit
Admission: RE | Admit: 2023-11-06 | Discharge: 2023-11-06 | Disposition: A | Discharge status: Home / Self Care | Source: Ambulatory Visit | Attending: Family Medicine | Admitting: Family Medicine

## 2023-11-06 DIAGNOSIS — H538 Other visual disturbances: Secondary | ICD-10-CM

## 2023-11-06 DIAGNOSIS — R202 Paresthesia of skin: Secondary | ICD-10-CM

## 2023-11-06 MED ORDER — GADOPICLENOL 0.5 MMOL/ML IV SOLN
9.0000 mL | Freq: Once | INTRAVENOUS | Status: AC | PRN
  Administered 2023-11-06: 9 mL via INTRAVENOUS

## 2023-11-22 ENCOUNTER — Encounter: Payer: Self-pay | Admitting: Internal Medicine

## 2023-11-22 ENCOUNTER — Ambulatory Visit (INDEPENDENT_AMBULATORY_CARE_PROVIDER_SITE_OTHER): Payer: 59 | Admitting: Internal Medicine

## 2023-11-22 VITALS — BP 136/88 | HR 90 | Ht 63.5 in | Wt 208.0 lb

## 2023-11-22 DIAGNOSIS — E785 Hyperlipidemia, unspecified: Secondary | ICD-10-CM

## 2023-11-22 DIAGNOSIS — K30 Functional dyspepsia: Secondary | ICD-10-CM | POA: Insufficient documentation

## 2023-11-22 DIAGNOSIS — E119 Type 2 diabetes mellitus without complications: Secondary | ICD-10-CM

## 2023-11-22 DIAGNOSIS — Z7985 Long-term (current) use of injectable non-insulin antidiabetic drugs: Secondary | ICD-10-CM

## 2023-11-22 LAB — POCT GLUCOSE (DEVICE FOR HOME USE): POC Glucose: 125 mg/dL — AB (ref 70–99)

## 2023-11-22 LAB — POCT GLYCOSYLATED HEMOGLOBIN (HGB A1C): Hemoglobin A1C: 5.7 % — AB (ref 4.0–5.6)

## 2023-11-22 NOTE — Progress Notes (Signed)
 Name: Krista Newton  Age/ Sex: 58 y.o., female   MRN/ DOB: [id number], 08/29/1965     PCP: Randeen Laine LABOR, MD   Reason for Endocrinology Evaluation: Type 2 Diabetes Mellitus     Initial Endocrinology Clinic Visit: 08/01/2018      PATIENT IDENTIFIER: Krista Newton is a 58 y.o. female with a past medical history of HTN and T2DM. The patient has followed with Endocrinology clinic since 08/01/2018 for consultative assistance with management of her diabetes.  DIABETIC HISTORY:  Ms. Wessman was diagnosed with T2DM in 2015. She reports intolerance to Metformin  (Throat chemical burn ) . Her hemoglobin A1c has ranged from 7.3% in 2017, peaking at 10.0% in 2020.  On her initial visit to our clinic her A1c was 10.0 % and was on Glipizide .   Intolerant to Rybelsus  08/2018   I increase her Jardiance  from 10 mg to 25 mg 10/2021 but she attributed the sore throat to it and stomach issues  Started mounjaro  12/2022  Stop glipizide  04/2023 with an A1c of 6.1%   SUBJECTIVE:   During the last visit (05/13/2023): A1c 6.1%     Today (11/05/2021): [name] is here for a   follow up on diabetes management. She checks her blood sugars 2 times daily.  The patient has been noted with hypoglycemic episodes in the morning, patient is symptomatic with these episodes.  Patient continues with weight loss Has noted a bout of diarrhea with associated indigestion which is rare  Denies constipation     HOME ENDOCRINE REGIMEN:  Mounjaro  5 mg weekly Rosuvastatin  5 mg 4 times a week    GLUCOSE LOG :     DIABETIC COMPLICATIONS: Microvascular complications:    Denies: CKD, neuropathy, retinopathy Last eye exam: Completed 02/2023, scheduled 02/2024     Macrovascular complications:    Denies: CAD, PVD, CVA   HISTORY:  Past Medical History:  Past Medical History:  Diagnosis Date   Allergy    Anemia    with pregnancy    COVID-19 virus infection 04/17/2019   Diabetes mellitus  without complication (HCC)    Hyperlipidemia    Hypertension    Past Surgical History:  Past Surgical History:  Procedure Laterality Date   CARPAL TUNNEL RELEASE Bilateral 04/24/2014   CESAREAN SECTION     TUBAL LIGATION     Social History:  reports that she has never smoked. She has never used smokeless tobacco. She reports current alcohol use. She reports that she does not use drugs. Family History:  Family History  Problem Relation Age of Onset   Hypertension Mother    Diabetes Mother    Heart failure Mother    Hypertension Father    Diabetes Sister    Kidney failure Brother    Colon cancer Neg Hx    Colon polyps Neg Hx    Esophageal cancer Neg Hx    Rectal cancer Neg Hx    Stomach cancer Neg Hx      HOME MEDICATIONS: Allergies as of 11/22/2023       Reactions   Metformin  And Related Other (See Comments)   The XR caused sore throat, HA, GI issues        Medication List        Accurate as of November 22, 2023  2:42 PM. If you have any questions, ask your nurse or doctor.          famotidine  20 MG tablet Commonly known as: PEPCID   Take 1 tablet (20 mg total) by mouth 2 (two) times daily.   hydrochlorothiazide  25 MG tablet Commonly known as: HYDRODIURIL  TAKE 1 TABLET BY MOUTH EVERY OTHER DAY   meloxicam  15 MG tablet Commonly known as: MOBIC  Take 1 tablet (15 mg total) by mouth daily as needed for pain. With food   onetouch ultrasoft lancets Twice daily   OneTouch Verio Reflect w/Device Kit Use to check blood sugar 2 times a day   OneTouch Verio test strip Generic drug: glucose blood USE AS INSTRUCTED TO CHECK BLOOD SUGAR 2 TIMES DAILY   rosuvastatin  5 MG tablet Commonly known as: Crestor  Take 1 tablet (5 mg total) by mouth as directed. Four  times a week   tirzepatide  5 MG/0.5ML Pen Commonly known as: MOUNJARO  Inject 5 mg into the skin once a week.   Vitamin D  (Ergocalciferol ) 1.25 MG (50000 UNIT) Caps capsule Commonly known as: DRISDOL  TAKE 1  CAPSULE (50,000 UNITS TOTAL) BY MOUTH TWO TIMES A WEEK        VITAL SIGNS: BP 136/88 (BP Location: Left Arm, Patient Position: Sitting, Cuff Size: Normal)   Pulse 90   Ht 5' 3.5 (1.613 m)   Wt 208 lb (94.3 kg)   SpO2 99%   BMI 36.27 kg/m    Filed Weights   11/22/23 1434  Weight: 208 lb (94.3 kg)    PHYSICAL EXAM:  General: Pt appears well and is in NAD  Lungs: Clear with good BS bilat   Heart: RRR  Extremities:   Lower extremities - No pretibial edema.   Neuro: MS is good with appropriate affect, pt is alert and Ox3      DM foot exam: 05/13/2023  The skin of the feet is intact without sores or ulcerations. The pedal pulses are 2+ on right and 2+ on left. The sensation is intact to a screening 5.07, 10 gram monofilament bilaterally    DATA REVIEWED:  Lab Results  Component Value Date   HGBA1C 6.1 (A) 05/13/2023   HGBA1C 7.8 (A) 01/13/2023   HGBA1C 8.1 (A) 08/21/2022    Latest Reference Range & Units 09/30/23 12:46  Sodium 135 - 145 mEq/L 139  Potassium 3.5 - 5.1 mEq/L 3.9  Chloride 96 - 112 mEq/L 107  CO2 19 - 32 mEq/L 25  Glucose 70 - 99 mg/dL 887 (H)  BUN 6 - 23 mg/dL 16  Creatinine 9.59 - 8.79 mg/dL 9.34  Calcium  8.4 - 10.5 mg/dL 9.0  Alkaline Phosphatase 39 - 117 U/L 62  Albumin 3.5 - 5.2 g/dL 4.0  AST 0 - 37 U/L 14  ALT 0 - 35 U/L 13  Total Protein 6.0 - 8.3 g/dL 7.3  Total Bilirubin 0.2 - 1.2 mg/dL 0.5  GFR >39.99 mL/min 96.21  Total CHOL/HDL Ratio  4  Cholesterol 0 - 200 mg/dL 791 (H)  HDL Cholesterol >39.00 mg/dL 42.39  LDL (calc) 0 - 99 mg/dL 871 (H)  NonHDL  849.60  Triglycerides 0.0 - 149.0 mg/dL 888.9  VLDL 0.0 - 59.9 mg/dL 77.7   In office BG 874 mg/DL   Old records , labs and images have been reviewed.    ASSESSMENT / PLAN / RECOMMENDATIONS:   1) Type 2 Diabetes Mellitus, Optimally  controlled, Without complications - Most recent A1c of 5.7%. Goal A1c < 7.0 %.    -A1c is optimal - Intolerant to Metformin  - Intolerant to  Jardiance   - Intolerant to Rybelsus   - No changes to current dose of Mounjaro   MEDICATIONS: -Continue Mounjaro  5 mg weekly     EDUCATION / INSTRUCTIONS: BG monitoring instructions: Patient is instructed to check her blood sugars 2 times a day, fasting and supper.  2) Diabetic complications:  Eye: Does not have known diabetic retinopathy.  Neuro/ Feet: Does not have known diabetic peripheral neuropathy. Renal: Patient does not have known baseline CKD. She is not on an ACEI/ARB at present.     3) Dyslipidemia :   -She does have generalized aches and pains that she attributes to statin therapy - She had declined  PCSK-9 inhibitors and Zetia in the past  - Recent labs were reviewed, LDL trending down Medications Continue rosuvastatin  5 mg , four times a week     4) Indigestion:  - Discussed avoiding fatty foods, greasy and tomato-based sauces - Patient advised to separate last meal of the day and bedtime by 4 hours - Patient to use Tums as needed for indigestion symptoms  F/U in 6  months    Signed electronically by: Stefano Redgie Butts, MD  [facility] Endocrinology  [facility] [address] [place], [place] [address] Phone: [phone] FAX: [phone]   CC: Tower, Laine LABOR, MD [address] [place] [address] Phone: [phone]  Fax: [phone]  Return to Endocrinology clinic as below: Future Appointments  Date Time Provider Department Center  11/22/2023  3:00 PM Klyn Kroening, Donell Redgie, MD [id number] None

## 2023-11-22 NOTE — Patient Instructions (Addendum)
-   Continue Mounajro 5 mg once weekly     Use Tums as needed for indigestion      HOW TO TREAT LOW BLOOD SUGARS (Blood sugar LESS THAN 70 MG/DL) Please follow the RULE OF 15 for the treatment of hypoglycemia treatment (when your (blood sugars are less than 70 mg/dL)   STEP 1: Take 15 grams of carbohydrates when your blood sugar is low, which includes:  3-4 GLUCOSE TABS  OR 3-4 OZ OF JUICE OR REGULAR SODA OR ONE TUBE OF GLUCOSE GEL    STEP 2: RECHECK blood sugar in 15 MINUTES STEP 3: If your blood sugar is still low at the 15 minute recheck --> then, go back to STEP 1 and treat AGAIN with another 15 grams of carbohydrates.

## 2024-03-17 LAB — OPHTHALMOLOGY REPORT-SCANNED

## 2024-04-10 ENCOUNTER — Other Ambulatory Visit: Payer: Self-pay | Admitting: Internal Medicine

## 2024-05-31 ENCOUNTER — Other Ambulatory Visit: Payer: Self-pay | Admitting: Internal Medicine

## 2024-05-31 ENCOUNTER — Ambulatory Visit: Admitting: Internal Medicine

## 2024-05-31 ENCOUNTER — Other Ambulatory Visit

## 2024-05-31 ENCOUNTER — Encounter: Payer: Self-pay | Admitting: Internal Medicine

## 2024-05-31 VITALS — BP 130/80 | HR 86 | Ht 64.0 in | Wt 209.0 lb

## 2024-05-31 DIAGNOSIS — Z7985 Long-term (current) use of injectable non-insulin antidiabetic drugs: Secondary | ICD-10-CM

## 2024-05-31 DIAGNOSIS — E785 Hyperlipidemia, unspecified: Secondary | ICD-10-CM | POA: Diagnosis not present

## 2024-05-31 DIAGNOSIS — E119 Type 2 diabetes mellitus without complications: Secondary | ICD-10-CM

## 2024-05-31 LAB — POCT GLYCOSYLATED HEMOGLOBIN (HGB A1C): Hemoglobin A1C: 5.4 % (ref 4.0–5.6)

## 2024-05-31 MED ORDER — FAMOTIDINE 20 MG PO TABS: 20.0000 mg | ORAL_TABLET | Freq: Every day | ORAL | 1 refills | Status: AC

## 2024-05-31 MED ORDER — TIRZEPATIDE 7.5 MG/0.5ML ~~LOC~~ SOAJ: 7.5000 mg | SUBCUTANEOUS | 3 refills | Status: AC

## 2024-05-31 NOTE — Progress Notes (Signed)
 "    Name: Krista Newton  Age/ Sex: 59 y.o., female   MRN/ DOB: [id number], [birth year]     PCP: Krista Newton LABOR, MD   Reason for Endocrinology Evaluation: Type 2 Diabetes Mellitus     Initial Endocrinology Clinic Visit: 08/01/2018      PATIENT IDENTIFIER: [name] is a 59 y.o. female with a past medical history of HTN and T2DM. The patient has followed with Endocrinology clinic since 08/01/2018 for consultative assistance with management of her diabetes.  DIABETIC HISTORY:  Krista Newton was diagnosed with T2DM in 2015. She reports intolerance to Metformin  (Throat chemical burn ) . Her hemoglobin A1c has ranged from 7.3% in 2017, peaking at 10.0% in 2020.  On her initial visit to our clinic her A1c was 10.0 % and was on Glipizide .   Intolerant to Rybelsus  08/2018   I increase her Jardiance  from 10 mg to 25 mg 10/2021 but she attributed the sore throat to it and stomach issues  Started mounjaro  12/2022  Stop glipizide  04/2023 with an A1c of 6.1%   SUBJECTIVE:   During the last visit (11/22/2023): A1c 5.7%      Today (11/05/2021): Krista Newton is here for a   follow up on diabetes management. She checks her blood sugars 1 times daily.  The patient has not had hypoglycemic episodes .   Weight stable  No nausea  Has noted occasional loose stools following mounjaro  injection as well as belching . Uses OTC anatacid as needed  No palpitations    HOME ENDOCRINE REGIMEN:  Mounjaro  5 mg weekly Rosuvastatin  5 mg, 4 times a week    GLUCOSE LOG :  89- 152 mg/dL    DIABETIC COMPLICATIONS: Microvascular complications:    Denies: CKD, neuropathy, retinopathy Last eye exam: Completed 03/17/2024     Macrovascular complications:    Denies: CAD, PVD, CVA   HISTORY:  Past Medical History:  Past Medical History:  Diagnosis Date   Allergy    Anemia    with pregnancy    COVID-19 virus infection 04/17/2019   Diabetes mellitus without complication (HCC)     Hyperlipidemia    Hypertension    Past Surgical History:  Past Surgical History:  Procedure Laterality Date   CARPAL TUNNEL RELEASE Bilateral 04/24/2014   CESAREAN SECTION     TUBAL LIGATION     Social History:  reports that she has never smoked. She has never used smokeless tobacco. She reports current alcohol use. She reports that she does not use drugs. Family History:  Family History  Problem Relation Age of Onset   Hypertension Mother    Diabetes Mother    Heart failure Mother    Hypertension Father    Diabetes Sister    Kidney failure Brother    Colon cancer Neg Hx    Colon polyps Neg Hx    Esophageal cancer Neg Hx    Rectal cancer Neg Hx    Stomach cancer Neg Hx      HOME MEDICATIONS: Allergies as of 05/31/2024       Reactions   Metformin  And Related Other (See Comments)   The XR caused sore throat, HA, GI issues        Medication List        Accurate as of May 31, 2024  3:03 PM. If you have any questions, ask your nurse or doctor.          famotidine  20 MG tablet Commonly known as:  PEPCID  Take 1 tablet (20 mg total) by mouth 2 (two) times daily.   hydrochlorothiazide  25 MG tablet Commonly known as: HYDRODIURIL  TAKE 1 TABLET BY MOUTH EVERY OTHER DAY   meloxicam  15 MG tablet Commonly known as: MOBIC  Take 1 tablet (15 mg total) by mouth daily as needed for pain. With food   onetouch ultrasoft lancets Twice daily   OneTouch Verio Reflect w/Device Kit Use to check blood sugar 2 times a day   OneTouch Verio test strip Generic drug: glucose blood USE AS INSTRUCTED TO CHECK BLOOD SUGAR 2 TIMES DAILY   rosuvastatin  5 MG tablet Commonly known as: CRESTOR  TAKE 1 TABLET (5 MG TOTAL) BY MOUTH AS DIRECTED. FOUR TIMES A WEEK   tirzepatide  5 MG/0.5ML Pen Commonly known as: MOUNJARO  Inject 5 mg into the skin once a week.   Vitamin D  (Ergocalciferol ) 1.25 MG (50000 UNIT) Caps capsule Commonly known as: DRISDOL  TAKE 1 CAPSULE (50,000 UNITS  TOTAL) BY MOUTH TWO TIMES A WEEK        VITAL SIGNS: BP 130/80   Pulse 86   Ht 5' 4 (1.626 m)   Wt 209 lb (94.8 kg)   SpO2 99%   BMI 35.87 kg/m    Filed Weights   05/31/24 1456  Weight: 209 lb (94.8 kg)     PHYSICAL EXAM:  General: Pt appears well and is in NAD  Lungs: Clear with good BS bilat   Heart: RRR  Extremities:   Lower extremities - No pretibial edema.   Neuro: MS is good with appropriate affect, pt is alert and Ox3      DM foot exam: 05/31/2024  The skin of the feet is intact without sores or ulcerations. The pedal pulses are 2+ on right and 2+ on left. The sensation is intact to a screening 5.07, 10 gram monofilament bilaterally    DATA REVIEWED:  Lab Results  Component Value Date   HGBA1C 5.4 05/31/2024   HGBA1C 5.7 (A) 11/22/2023   HGBA1C 6.1 (A) 05/13/2023    Latest Reference Range & Units 05/31/24 15:22  Sodium 135 - 146 mmol/L 138  Potassium 3.5 - 5.3 mmol/L 4.2  Chloride 98 - 110 mmol/L 104  CO2 20 - 32 mmol/L 25  Glucose 65 - 99 mg/dL 92  BUN 7 - 25 mg/dL 16  Creatinine 9.49 - 8.94 mg/dL 9.34  Calcium  8.6 - 10.4 mg/dL 9.6  BUN/Creatinine Ratio 6 - 22 (calc) SEE NOTE:  eGFR > OR = 60 mL/min/1.86m2 101  Total CHOL/HDL Ratio <5.0 (calc) 2.6  Cholesterol <200 mg/dL 809  HDL Cholesterol > OR = 50 mg/dL 74  LDL Cholesterol (Calc) mg/dL (calc) 97  MICROALB/CREAT RATIO <30 mg/g creat NOTE  Non-HDL Cholesterol (Calc) <130 mg/dL (calc) 883  Triglycerides <150 mg/dL 94    Latest Reference Range & Units 05/31/24 15:22  Microalb, Ur mg/dL <9.7  MICROALB/CREAT RATIO <30 mg/g creat NOTE  Creatinine, Urine 20 - 275 mg/dL 34    Old records , labs and images have been reviewed.    ASSESSMENT / PLAN / RECOMMENDATIONS:   1) Type 2 Diabetes Mellitus, Optimally  controlled, Without complications - Most recent A1c of 5.4%. Goal A1c < 7.0 %.    - A1c remains optimal - Intolerant to Metformin  - Intolerant to Jardiance   - Intolerant to Rybelsus    - Weight has plateaued and we discussed increasing the dose, I did assure the patient that if she feels she has lost too much weight we can certainly  decrease the dose - She is having some temporary loose stools and sometimes indigestion with Mounjaro  intake, we did discuss the importance of making dietary changes including avoiding greasy food as well as sometimes daily  MEDICATIONS: - Increase Mounjaro  7.5 mg weekly     EDUCATION / INSTRUCTIONS: BG monitoring instructions: Patient is instructed to check her blood sugars 1 times a day  2) Diabetic complications:  Eye: Does not have known diabetic retinopathy.  Neuro/ Feet: Does not have known diabetic peripheral neuropathy. Renal: Patient does not have known baseline CKD. She is not on an ACEI/ARB at present.     3) Dyslipidemia :   -She does have generalized aches and pains that she attributes to statin therapy - She had declined  PCSK-9 inhibitors and Zetia in the past  - Lipid panel acceptable, no change  Medications Continue rosuvastatin  5 mg , four times a week    F/U in 6  months    Signed electronically by: Stefano Redgie Butts, MD  [facility] Endocrinology  [facility] [address] [place], [place] [address] Phone: [phone] FAX: [phone]   CC: Tower, Newton LABOR, MD [address] [place] [address] Phone: [phone]  Fax: [phone]  Return to Endocrinology clinic as below: No future appointments.       "

## 2024-05-31 NOTE — Patient Instructions (Signed)
-   Krista Newton 7.5 mg once weekly      HOW TO TREAT LOW BLOOD SUGARS (Blood sugar LESS THAN 70 MG/DL) Please follow the RULE OF 15 for the treatment of hypoglycemia treatment (when your (blood sugars are less than 70 mg/dL)   STEP 1: Take 15 grams of carbohydrates when your blood sugar is low, which includes:  3-4 GLUCOSE TABS  OR 3-4 OZ OF JUICE OR REGULAR SODA OR ONE TUBE OF GLUCOSE GEL    STEP 2: RECHECK blood sugar in 15 MINUTES STEP 3: If your blood sugar is still low at the 15 minute recheck --> then, go back to STEP 1 and treat AGAIN with another 15 grams of carbohydrates.

## 2024-06-01 ENCOUNTER — Ambulatory Visit: Payer: Self-pay | Admitting: Internal Medicine

## 2024-06-01 LAB — MICROALBUMIN / CREATININE URINE RATIO
Creatinine, Urine: 34 mg/dL (ref 20–275)
Microalb, Ur: <0.2 mg/dL

## 2024-06-01 LAB — LIPID PANEL
Cholesterol: 190 mg/dL
HDL: 74 mg/dL
LDL Cholesterol (Calc): 97 mg/dL
Non-HDL Cholesterol (Calc): 116 mg/dL
Total CHOL/HDL Ratio: 2.6 (calc)
Triglycerides: 94 mg/dL

## 2024-06-01 LAB — BASIC METABOLIC PANEL WITH GFR
BUN: 16 mg/dL (ref 7–25)
CO2: 25 mmol/L (ref 20–32)
Calcium: 9.6 mg/dL (ref 8.6–10.4)
Chloride: 104 mmol/L (ref 98–110)
Creat: 0.65 mg/dL (ref 0.50–1.05)
Glucose, Bld: 92 mg/dL (ref 65–99)
Potassium: 4.2 mmol/L (ref 3.5–5.3)
Sodium: 138 mmol/L (ref 135–146)
eGFR: 101 mL/min/1.73m2

## 2024-06-01 MED ORDER — ROSUVASTATIN CALCIUM 5 MG PO TABS: 5.0000 mg | ORAL_TABLET | ORAL | 3 refills | Status: AC

## 2024-06-08 ENCOUNTER — Other Ambulatory Visit: Payer: Self-pay | Admitting: Internal Medicine

## 2024-06-08 DIAGNOSIS — E559 Vitamin D deficiency, unspecified: Secondary | ICD-10-CM

## 2024-06-09 ENCOUNTER — Other Ambulatory Visit: Payer: Self-pay | Admitting: Family Medicine

## 2024-06-09 DIAGNOSIS — Z1231 Encounter for screening mammogram for malignant neoplasm of breast: Secondary | ICD-10-CM

## 2024-06-14 ENCOUNTER — Inpatient Hospital Stay: Admission: RE | Admit: 2024-06-14 | Discharge: 2024-06-14 | Attending: Family Medicine | Admitting: Family Medicine

## 2024-06-14 DIAGNOSIS — Z1231 Encounter for screening mammogram for malignant neoplasm of breast: Secondary | ICD-10-CM

## 2024-06-19 ENCOUNTER — Ambulatory Visit: Payer: Self-pay | Admitting: Family Medicine

## 2024-06-21 ENCOUNTER — Ambulatory Visit: Admitting: Family Medicine

## 2024-06-21 VITALS — BP 130/72 | HR 88 | Temp 97.8°F | Ht 63.25 in | Wt 207.0 lb

## 2024-06-21 DIAGNOSIS — Z7985 Long-term (current) use of injectable non-insulin antidiabetic drugs: Secondary | ICD-10-CM | POA: Diagnosis not present

## 2024-06-21 DIAGNOSIS — Z0000 Encounter for general adult medical examination without abnormal findings: Secondary | ICD-10-CM

## 2024-06-21 DIAGNOSIS — E785 Hyperlipidemia, unspecified: Secondary | ICD-10-CM

## 2024-06-21 DIAGNOSIS — Z6836 Body mass index (BMI) 36.0-36.9, adult: Secondary | ICD-10-CM | POA: Diagnosis not present

## 2024-06-21 DIAGNOSIS — E1165 Type 2 diabetes mellitus with hyperglycemia: Secondary | ICD-10-CM

## 2024-06-21 DIAGNOSIS — E119 Type 2 diabetes mellitus without complications: Secondary | ICD-10-CM

## 2024-06-21 DIAGNOSIS — I10 Essential (primary) hypertension: Secondary | ICD-10-CM

## 2024-06-21 DIAGNOSIS — E559 Vitamin D deficiency, unspecified: Secondary | ICD-10-CM | POA: Diagnosis not present

## 2024-06-21 DIAGNOSIS — E1169 Type 2 diabetes mellitus with other specified complication: Secondary | ICD-10-CM

## 2024-06-21 DIAGNOSIS — Z1211 Encounter for screening for malignant neoplasm of colon: Secondary | ICD-10-CM

## 2024-06-21 NOTE — Assessment & Plan Note (Signed)
 Reviewed health habits including diet and exercise and skin cancer prevention Reviewed appropriate screening tests for age  Also reviewed health mt list, fam hx and immunization status , as well as social and family history   See HPI Labs reviewed and ordered Health Maintenance  Topic Date Due   Pneumococcal Vaccine for age over 27 (2 of 2 - PPSV23, PCV20, or PCV21) 04/12/2012   COVID-19 Vaccine (4 - 2025-26 season) 01/17/2024   Zoster (Shingles) Vaccine (1 of 2) 09/18/2025*   Hemoglobin A1C  11/28/2024   Eye exam for diabetics  03/17/2025   Yearly kidney function blood test for diabetes  05/31/2025   Kidney health urinalysis for diabetes  05/31/2025   Complete foot exam   05/31/2025   Breast Cancer Screening  06/14/2025   DTaP/Tdap/Td vaccine (3 - Td or Tdap) 01/21/2026   Pap with HPV screening  06/25/2026   Colon Cancer Screening  08/04/2026   Flu Shot  Completed   Hepatitis B Vaccine  Completed   HPV Vaccine (No Doses Required) Completed   Hepatitis C Screening  Completed   HIV Screening  Completed   Meningitis B Vaccine  Aged Out  *Topic was postponed. The date shown is not the original due date.    Declines shingrix  Has upcoming appointment with gyn then will likely sign off  Normal dexa 2023  Discussed fall prevention, supplements and exercise for bone density  No falls or fractures  PHQ 1

## 2024-06-21 NOTE — Patient Instructions (Addendum)
 Add some strength training to your routine, this is important for bone and brain health and can reduce your risk of falls and help your body use insulin properly and regulate weight  Light weights, exercise bands , and internet videos are a good way to start  Yoga (chair or regular), machines , floor exercises or a gym with machines are also good options    This is incredibly important in setting of taking the glp-1 medication   Labs today    Have one more visit with gyn

## 2024-06-21 NOTE — Assessment & Plan Note (Signed)
Colonoscopy 07/2019 with 7 y recall

## 2024-06-21 NOTE — Assessment & Plan Note (Signed)
 Bmi of 36.3 with co morbidities of DM2, HTN and hyperlipidemia   Has lost weight with glp-1   Discussed how this problem influences overall health and the risks it imposes  Reviewed plan for weight loss with lower calorie diet (via better food choices (lower glycemic and portion control) along with exercise building up to or more than 30 minutes 5 days per week including some aerobic activity and strength training   Encouraged strength training to build muscle/metabolism

## 2024-06-21 NOTE — Assessment & Plan Note (Signed)
 D level today  Continues ergocalciferol  prescription from endocrinology Vitamin D  level is therapeutic with current supplementation Disc importance of this to bone and overall health

## 2024-06-21 NOTE — Progress Notes (Signed)
 "  Subjective:    Patient ID: Krista Newton, female    DOB: 08/25/1965, 59 y.o.   MRN: [id number]  HPI  Here for health maintenance exam and to review chronic medical problems   Wt Readings from Last 3 Encounters:  06/21/24 207 lb (93.9 kg)  05/31/24 209 lb (94.8 kg)  11/22/23 208 lb (94.3 kg)   36.38 kg/m  Vitals:   06/21/24 1355  BP: 130/72  Pulse: 88  Temp: 97.8 F (36.6 C)  SpO2: 99%    Immunization History  Administered Date(s) Administered   Hepatitis B, ADULT 01/09/2016, 02/11/2016, 07/14/2016   Influenza,inj,Quad PF,6+ Mos 06/07/2015, 02/11/2016   Influenza-Unspecified 02/12/2018, 03/02/2019, 03/02/2023, 03/06/2024   PFIZER(Purple Top)SARS-COV-2 Vaccination 08/12/2019, 09/06/2019, 08/23/2020   PPD Test 06/05/2015, 05/23/2019   Pneumococcal Conjugate-13 02/16/2012   Td 10/14/2005   Tdap 01/22/2016    Health Maintenance Due  Topic Date Due   Pneumococcal Vaccine: 50+ Years (2 of 2 - PPSV23, PCV20, or PCV21) 04/12/2012   COVID-19 Vaccine (4 - 2025-26 season) 01/17/2024   Had flu shot in fall   Shingrix -not interested   Mammogram 05/2024 Self breast exam- no lumps   Gyn health Pap 06/2021  Appointment upcoming and then will change to us  for maintenance   Colon cancer screening  Colonoscopy 07/2019 - 7 year recall   Bone health  Had dexa - at her gyn , 2023 was normal   Falls-none  Fractures-none  Supplements prescription vit D from endocrinology  Last vitamin D  Lab Results  Component Value Date   VD25OH 48.61 05/21/2023    Exercise  Minimal  Knows she needs to do more strength training   Eats a lot of protein   Was working part time  Caring for her husband  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     Mood    06/21/2024    1:55 PM 09/30/2023   12:13 PM 05/21/2023   10:21 AM 01/01/2023   10:06 AM 06/09/2022   10:45 AM  Depression screen PHQ 2/9  Decreased Interest 0 0 0 0 0  Down, Depressed, Hopeless 0 1 0 0 0  PHQ - 2 Score 0 1  0 0 0  Altered sleeping 0 1 1 1 1   Tired, decreased energy 1 1 1 1 1   Change in appetite 0 0 0 0 0  Feeling bad or failure about yourself  0 0 0 0 0  Trouble concentrating 0 0 0 0 0  Moving slowly or fidgety/restless 0 0 0 0 0  Suicidal thoughts 0 0 0 0 0  PHQ-9 Score 1 3  2  2  2    Difficult doing work/chores Not difficult at all Not difficult at all Not difficult at all Not difficult at all Not difficult at all     Data saved with a previous flowsheet row definition    HTN bp is stable today  No cp or palpitations or headaches or edema  No side effects to medicines  BP Readings from Last 3 Encounters:  06/21/24 130/72  05/31/24 130/80  11/22/23 136/88     Lab Results  Component Value Date   NA 138 05/31/2024   K 4.2 05/31/2024   CO2 25 05/31/2024   GLUCOSE 92 05/31/2024   BUN 16 05/31/2024   CREATININE 0.65 05/31/2024   CALCIUM  9.6 05/31/2024   GFR 96.21 09/30/2023   EGFR 101 05/31/2024   Hydrochlorothiazide  25 mg every other day  DM2 Endocrinology care  Tirzepatide  7.5 mg weekly   Lab Results  Component Value Date   HGBA1C 5.4 05/31/2024   HGBA1C 5.7 (A) 11/22/2023   HGBA1C 6.1 (A) 05/13/2023   In good control Lab Results  Component Value Date   MICROALBUR <0.2 05/31/2024       Hyperlipidemia Lab Results  Component Value Date   CHOL 190 05/31/2024   CHOL 208 (H) 09/30/2023   CHOL 243 (H) 05/21/2023   Lab Results  Component Value Date   HDL 74 05/31/2024   HDL 57.60 09/30/2023   HDL 63.40 05/21/2023   Lab Results  Component Value Date   LDLCALC 97 05/31/2024   LDLCALC 128 (H) 09/30/2023   LDLCALC 148 (H) 05/21/2023   Lab Results  Component Value Date   TRIG 94 05/31/2024   TRIG 111.0 09/30/2023   TRIG 158.0 (H) 05/21/2023   Lab Results  Component Value Date   CHOLHDL 2.6 05/31/2024   CHOLHDL 4 09/30/2023   CHOLHDL 4 05/21/2023   No results found for: LDLDIRECT Crestor  5 mg daily  Has adjusted to it  Did have some muscle  pain initially    Lab Results  Component Value Date   ALT 13 09/30/2023   AST 14 09/30/2023   ALKPHOS 62 09/30/2023   BILITOT 0.5 09/30/2023       Patient Active Problem List   Diagnosis Date Noted   Paresthesia of arm 09/30/2023   Blurred vision 09/30/2023   Encounter for screening mammogram for breast cancer 05/21/2023   Diabetes mellitus treated with injections of non-insulin medication (HCC) 05/21/2023   Knee pain, right 01/01/2023   Atypical chest pain 06/09/2022   Diabetes mellitus without complication (HCC) 04/18/2021   Left knee pain 11/22/2020   Need for hepatitis C screening test 05/15/2019   Encounter for screening for HIV 05/15/2019   Colon cancer screening 05/15/2019   Vitamin D  deficiency 11/22/2018   Numbness and tingling sensation of skin 10/04/2018   Neck pain 08/18/2017   Productive cough 08/18/2017   Morbid obesity (HCC) 07/07/2015   Essential hypertension 07/05/2015   Hyperlipidemia associated with type 2 diabetes mellitus (HCC) 08/31/2014   Type 2 diabetes mellitus with hyperglycemia, without long-term current use of insulin (HCC) 02/15/2014   Leg cramps 02/06/2014   History of shingles 09/18/2013   Carpal tunnel syndrome 05/31/2013   Routine general medical examination at a health care facility 07/04/2012   HEARING LOSS, RIGHT EAR 07/26/2008   ALLERGIC RHINITIS 06/28/2008   Past Medical History:  Diagnosis Date   Allergy    Anemia    with pregnancy    COVID-19 virus infection 04/17/2019   Diabetes mellitus without complication (HCC)    Hyperlipidemia    Hypertension    Past Surgical History:  Procedure Laterality Date   CARPAL TUNNEL RELEASE Bilateral 04/24/2014   CESAREAN SECTION     TUBAL LIGATION     Social History[1] Family History  Problem Relation Age of Onset   Hypertension Mother    Diabetes Mother    Heart failure Mother    Hypertension Father    Arthritis Father    Diabetes Sister    Kidney failure Brother    Colon  cancer Neg Hx    Colon polyps Neg Hx    Esophageal cancer Neg Hx    Rectal cancer Neg Hx    Stomach cancer Neg Hx    Breast cancer Neg Hx    Allergies[2] Medications Ordered Prior to Encounter[3]  Review of Systems  Constitutional:  Negative for activity change, appetite change, fatigue, fever and unexpected weight change.  HENT:  Negative for congestion, ear pain, rhinorrhea, sinus pressure and sore throat.   Eyes:  Negative for pain, redness and visual disturbance.  Respiratory:  Negative for cough, shortness of breath and wheezing.   Cardiovascular:  Negative for chest pain and palpitations.  Gastrointestinal:  Negative for abdominal pain, blood in stool, constipation and diarrhea.  Endocrine: Negative for polydipsia and polyuria.  Genitourinary:  Negative for dysuria, frequency and urgency.  Musculoskeletal:  Negative for arthralgias, back pain and myalgias.  Skin:  Negative for pallor and rash.  Allergic/Immunologic: Negative for environmental allergies.  Neurological:  Negative for dizziness, syncope and headaches.  Hematological:  Negative for adenopathy. Does not bruise/bleed easily.  Psychiatric/Behavioral:  Negative for decreased concentration and dysphoric mood. The patient is not nervous/anxious.        Some stress Caring for her husband        Objective:   Physical Exam Constitutional:      General: She is not in acute distress.    Appearance: Normal appearance. She is well-developed. She is obese. She is not ill-appearing or diaphoretic.  HENT:     Head: Normocephalic and atraumatic.     Right Ear: Tympanic membrane, ear canal and external ear normal.     Left Ear: Tympanic membrane, ear canal and external ear normal.     Nose: Nose normal. No congestion.     Mouth/Throat:     Mouth: Mucous membranes are moist.     Pharynx: Oropharynx is clear. No posterior oropharyngeal erythema.  Eyes:     General: No scleral icterus.    Extraocular Movements: Extraocular  movements intact.     Conjunctiva/sclera: Conjunctivae normal.     Pupils: Pupils are equal, round, and reactive to light.  Neck:     Thyroid : No thyromegaly.     Vascular: No carotid bruit or JVD.  Cardiovascular:     Rate and Rhythm: Normal rate and regular rhythm.     Pulses: Normal pulses.     Heart sounds: Normal heart sounds.     No gallop.  Pulmonary:     Effort: Pulmonary effort is normal. No respiratory distress.     Breath sounds: Normal breath sounds. No wheezing.     Comments: Good air exch Chest:     Chest wall: No tenderness.  Abdominal:     General: Bowel sounds are normal. There is no distension or abdominal bruit.     Palpations: Abdomen is soft. There is no mass.     Tenderness: There is no abdominal tenderness.     Hernia: No hernia is present.  Genitourinary:    Comments: Breast and pelvic exam are done by gyn provider   Musculoskeletal:        General: No tenderness. Normal range of motion.     Cervical back: Normal range of motion and neck supple. No rigidity. No muscular tenderness.     Right lower leg: No edema.     Left lower leg: No edema.     Comments: No kyphosis   Lymphadenopathy:     Cervical: No cervical adenopathy.  Skin:    General: Skin is warm and dry.     Coloration: Skin is not pale.     Findings: No erythema or rash.  Neurological:     Mental Status: She is alert. Mental status is at baseline.  Cranial Nerves: No cranial nerve deficit.     Motor: No abnormal muscle tone.     Coordination: Coordination normal.     Gait: Gait normal.     Deep Tendon Reflexes: Reflexes are normal and symmetric. Reflexes normal.  Psychiatric:        Mood and Affect: Mood normal.        Cognition and Memory: Cognition and memory normal.           Assessment & Plan:   Problem List Items Addressed This Visit       Cardiovascular and Mediastinum   Essential hypertension   bp in fair control at this time  BP Readings from Last 1 Encounters:   06/21/24 130/72   No changes needed Most recent labs reviewed  Disc lifstyle change with low sodium diet and exercise  Continues hctz 25 mg every other day      Relevant Orders   CBC with Differential/Platelet   TSH   Hepatic function panel     Endocrine   Type 2 diabetes mellitus with hyperglycemia, without long-term current use of insulin (HCC)   Utd endocrinology care  Last A1c 5.4 Microalb normal  Continues tirzepatide  7.5 mg weekly      Hyperlipidemia associated with type 2 diabetes mellitus (HCC)   Disc goals for lipids and reasons to control them Rev last labs with pt Rev low sat fat diet in detail  LDL 91 -on crestor  5 mg daily (most she can tolerate)       Diabetes mellitus treated with injections of non-insulin medication (HCC)   Continues tirzepatide  Endocrinology care         Other   Vitamin D  deficiency   D level today  Continues ergocalciferol  prescription from endocrinology Vitamin D  level is therapeutic with current supplementation Disc importance of this to bone and overall health       Relevant Orders   VITAMIN D  25 Hydroxy (Vit-D Deficiency, Fractures)   Routine general medical examination at a health care facility - Primary   Reviewed health habits including diet and exercise and skin cancer prevention Reviewed appropriate screening tests for age  Also reviewed health mt list, fam hx and immunization status , as well as social and family history   See HPI Labs reviewed and ordered Health Maintenance  Topic Date Due   Pneumococcal Vaccine for age over 18 (2 of 2 - PPSV23, PCV20, or PCV21) 04/12/2012   COVID-19 Vaccine (4 - 2025-26 season) 01/17/2024   Zoster (Shingles) Vaccine (1 of 2) 09/18/2025*   Hemoglobin A1C  11/28/2024   Eye exam for diabetics  03/17/2025   Yearly kidney function blood test for diabetes  05/31/2025   Kidney health urinalysis for diabetes  05/31/2025   Complete foot exam   05/31/2025   Breast Cancer Screening   06/14/2025   DTaP/Tdap/Td vaccine (3 - Td or Tdap) 01/21/2026   Pap with HPV screening  06/25/2026   Colon Cancer Screening  08/04/2026   Flu Shot  Completed   Hepatitis B Vaccine  Completed   HPV Vaccine (No Doses Required) Completed   Hepatitis C Screening  Completed   HIV Screening  Completed   Meningitis B Vaccine  Aged Out  *Topic was postponed. The date shown is not the original due date.    Declines shingrix  Has upcoming appointment with gyn then will likely sign off  Normal dexa 2023  Discussed fall prevention, supplements and exercise for bone density  No falls  or fractures  PHQ 1       Morbid obesity (HCC)   Bmi of 36.3 with co morbidities of DM2, HTN and hyperlipidemia   Has lost weight with glp-1   Discussed how this problem influences overall health and the risks it imposes  Reviewed plan for weight loss with lower calorie diet (via better food choices (lower glycemic and portion control) along with exercise building up to or more than 30 minutes 5 days per week including some aerobic activity and strength training   Encouraged strength training to build muscle/metabolism      Colon cancer screening   Colonoscopy 07/2019 with 7 y recall          [1]  Social History Tobacco Use   Smoking status: Never   Smokeless tobacco: Never  Vaping Use   Vaping status: Never Used  Substance Use Topics   Alcohol use: Yes    Alcohol/week: 0.0 standard drinks of alcohol    Comment: Rare   Drug use: Never  [2]  Allergies Allergen Reactions   Metformin  And Related Other (See Comments)    The XR caused sore throat, HA, GI issues  [3]  Current Outpatient Medications on File Prior to Visit  Medication Sig Dispense Refill   Blood Glucose Monitoring Suppl (ONETOUCH VERIO REFLECT) w/Device KIT Use to check blood sugar 2 times a day 1 kit 0   famotidine  (PEPCID ) 20 MG tablet Take 1 tablet (20 mg total) by mouth daily. 90 tablet 1   hydrochlorothiazide  (HYDRODIURIL ) 25  MG tablet TAKE 1 TABLET BY MOUTH EVERY OTHER DAY 45 tablet 2   Lancets (ONETOUCH ULTRASOFT) lancets Twice daily 100 each 12   ONETOUCH VERIO test strip USE AS INSTRUCTED TO CHECK BLOOD SUGAR 2 TIMES DAILY 50 strip 25   rosuvastatin  (CRESTOR ) 5 MG tablet Take 1 tablet (5 mg total) by mouth as directed. Four  times a week 52 tablet 3   tirzepatide  (MOUNJARO ) 7.5 MG/0.5ML Pen Inject 7.5 mg into the skin once a week. 6 mL 3   Vitamin D , Ergocalciferol , (DRISDOL ) 1.25 MG (50000 UNIT) CAPS capsule TAKE 1 CAPSULE (50,000 UNITS TOTAL) BY MOUTH TWO TIMES A WEEK 8 capsule 12   No current facility-administered medications on file prior to visit.   "

## 2024-06-21 NOTE — Assessment & Plan Note (Signed)
 bp in fair control at this time  BP Readings from Last 1 Encounters:  06/21/24 130/72   No changes needed Most recent labs reviewed  Disc lifstyle change with low sodium diet and exercise  Continues hctz 25 mg every other day

## 2024-06-21 NOTE — Assessment & Plan Note (Signed)
 Continues tirzepatide  Endocrinology care

## 2024-06-21 NOTE — Assessment & Plan Note (Signed)
 Disc goals for lipids and reasons to control them Rev last labs with pt Rev low sat fat diet in detail  LDL 91 -on crestor  5 mg daily (most she can tolerate)

## 2024-06-21 NOTE — Assessment & Plan Note (Signed)
 Utd endocrinology care  Last A1c 5.4 Microalb normal  Continues tirzepatide  7.5 mg weekly

## 2024-06-22 ENCOUNTER — Ambulatory Visit: Payer: Self-pay | Admitting: Family Medicine

## 2024-06-22 LAB — CBC WITH DIFFERENTIAL/PLATELET
Basophils Absolute: 0 10*3/uL (ref 0.0–0.1)
Basophils Relative: 0.7 % (ref 0.0–3.0)
Eosinophils Absolute: 0.2 10*3/uL (ref 0.0–0.7)
Eosinophils Relative: 3.1 % (ref 0.0–5.0)
HCT: 39.2 % (ref 36.0–46.0)
Hemoglobin: 13.3 g/dL (ref 12.0–15.0)
Lymphocytes Relative: 47.8 % — ABNORMAL HIGH (ref 12.0–46.0)
Lymphs Abs: 2.8 10*3/uL (ref 0.7–4.0)
MCHC: 34 g/dL (ref 30.0–36.0)
MCV: 91.2 fl (ref 78.0–100.0)
Monocytes Absolute: 0.4 10*3/uL (ref 0.1–1.0)
Monocytes Relative: 5.9 % (ref 3.0–12.0)
Neutro Abs: 2.5 10*3/uL (ref 1.4–7.7)
Neutrophils Relative %: 42.5 % — ABNORMAL LOW (ref 43.0–77.0)
Platelets: 244 10*3/uL (ref 150.0–400.0)
RBC: 4.3 Mil/uL (ref 3.87–5.11)
RDW: 13.3 % (ref 11.5–15.5)
WBC: 5.9 10*3/uL (ref 4.0–10.5)

## 2024-06-22 LAB — HEPATIC FUNCTION PANEL
ALT: 14 U/L (ref 3–35)
AST: 15 U/L (ref 5–37)
Albumin: 4.3 g/dL (ref 3.5–5.2)
Alkaline Phosphatase: 56 U/L (ref 39–117)
Bilirubin, Direct: 0.1 mg/dL (ref 0.1–0.3)
Total Bilirubin: 0.5 mg/dL (ref 0.2–1.2)
Total Protein: 7.2 g/dL (ref 6.0–8.3)

## 2024-06-22 LAB — TSH: TSH: 1.25 u[IU]/mL (ref 0.35–5.50)

## 2024-06-22 LAB — VITAMIN D 25 HYDROXY (VIT D DEFICIENCY, FRACTURES): VITD: 56.43 ng/mL (ref 30.00–100.00)

## 2024-07-26 ENCOUNTER — Ambulatory Visit: Admitting: Radiology

## 2024-12-04 ENCOUNTER — Ambulatory Visit: Admitting: Internal Medicine
